# Patient Record
Sex: Female | Born: 1935 | Race: White | Hispanic: No | Smoking: Former smoker
Health system: Southern US, Community
[De-identification: ages and names within clinical notes are randomized; demographics above are authoritative.]

## PROBLEM LIST (undated history)

## (undated) DIAGNOSIS — I495 Sick sinus syndrome: Secondary | ICD-10-CM

## (undated) DIAGNOSIS — M1990 Unspecified osteoarthritis, unspecified site: Secondary | ICD-10-CM

## (undated) DIAGNOSIS — R0609 Other forms of dyspnea: Secondary | ICD-10-CM

## (undated) DIAGNOSIS — I442 Atrioventricular block, complete: Secondary | ICD-10-CM

## (undated) DIAGNOSIS — E785 Hyperlipidemia, unspecified: Secondary | ICD-10-CM

## (undated) DIAGNOSIS — B49 Unspecified mycosis: Secondary | ICD-10-CM

## (undated) DIAGNOSIS — N183 Chronic kidney disease, stage 3 unspecified: Secondary | ICD-10-CM

## (undated) DIAGNOSIS — H7090 Unspecified mastoiditis, unspecified ear: Secondary | ICD-10-CM

## (undated) DIAGNOSIS — G4700 Insomnia, unspecified: Secondary | ICD-10-CM

## (undated) DIAGNOSIS — E039 Hypothyroidism, unspecified: Secondary | ICD-10-CM

## (undated) DIAGNOSIS — M810 Age-related osteoporosis without current pathological fracture: Secondary | ICD-10-CM

## (undated) DIAGNOSIS — I10 Essential (primary) hypertension: Secondary | ICD-10-CM

## (undated) DIAGNOSIS — R0600 Dyspnea, unspecified: Secondary | ICD-10-CM

## (undated) HISTORY — DX: Sick sinus syndrome: I49.5

## (undated) HISTORY — DX: Insomnia, unspecified: G47.00

## (undated) HISTORY — PX: THYROIDECTOMY: SHX17

## (undated) HISTORY — PX: MASTOIDECTOMY: SHX711

## (undated) HISTORY — DX: Hypothyroidism, unspecified: E03.9

## (undated) HISTORY — PX: CATARACT EXTRACTION: SUR2

## (undated) HISTORY — DX: Hyperlipidemia, unspecified: E78.5

## (undated) HISTORY — PX: LAPAROTOMY: SHX154

## (undated) HISTORY — DX: Essential (primary) hypertension: I10

## (undated) HISTORY — DX: Unspecified mastoiditis, unspecified ear: H70.90

## (undated) HISTORY — DX: Unspecified osteoarthritis, unspecified site: M19.90

## (undated) HISTORY — PX: BACK SURGERY: SHX140

## (undated) HISTORY — DX: Chronic kidney disease, stage 3 unspecified: N18.30

## (undated) HISTORY — DX: Chronic kidney disease, stage 3 (moderate): N18.3

## (undated) HISTORY — DX: Other forms of dyspnea: R06.09

## (undated) HISTORY — DX: Age-related osteoporosis without current pathological fracture: M81.0

## (undated) HISTORY — DX: Unspecified mycosis: B49

## (undated) HISTORY — DX: Dyspnea, unspecified: R06.00

## (undated) HISTORY — DX: Atrioventricular block, complete: I44.2

---

## 2009-11-26 HISTORY — PX: INSERT / REPLACE / REMOVE PACEMAKER: SUR710

## 2011-12-29 ENCOUNTER — Encounter (INDEPENDENT_AMBULATORY_CARE_PROVIDER_SITE_OTHER): Payer: Medicare Other | Admitting: Ophthalmology

## 2011-12-29 DIAGNOSIS — H3530 Unspecified macular degeneration: Secondary | ICD-10-CM

## 2011-12-29 DIAGNOSIS — H43819 Vitreous degeneration, unspecified eye: Secondary | ICD-10-CM

## 2012-12-29 ENCOUNTER — Ambulatory Visit (INDEPENDENT_AMBULATORY_CARE_PROVIDER_SITE_OTHER): Payer: Medicare Other | Admitting: Ophthalmology

## 2012-12-29 DIAGNOSIS — H35039 Hypertensive retinopathy, unspecified eye: Secondary | ICD-10-CM

## 2012-12-29 DIAGNOSIS — H3530 Unspecified macular degeneration: Secondary | ICD-10-CM

## 2012-12-29 DIAGNOSIS — I10 Essential (primary) hypertension: Secondary | ICD-10-CM

## 2012-12-29 DIAGNOSIS — H43819 Vitreous degeneration, unspecified eye: Secondary | ICD-10-CM

## 2013-03-18 ENCOUNTER — Encounter: Payer: Self-pay | Admitting: Internal Medicine

## 2013-03-30 ENCOUNTER — Encounter: Payer: Medicare Other | Admitting: Internal Medicine

## 2013-05-05 ENCOUNTER — Ambulatory Visit (INDEPENDENT_AMBULATORY_CARE_PROVIDER_SITE_OTHER): Payer: Medicare Other | Admitting: Internal Medicine

## 2013-05-05 ENCOUNTER — Encounter: Payer: Self-pay | Admitting: Internal Medicine

## 2013-05-05 VITALS — BP 148/78 | HR 65 | Ht 64.0 in | Wt 139.2 lb

## 2013-05-05 DIAGNOSIS — I442 Atrioventricular block, complete: Secondary | ICD-10-CM | POA: Insufficient documentation

## 2013-05-05 DIAGNOSIS — I10 Essential (primary) hypertension: Secondary | ICD-10-CM | POA: Insufficient documentation

## 2013-05-05 LAB — MDC_IDC_ENUM_SESS_TYPE_INCLINIC
Brady Statistic RA Percent Paced: 60 %
Brady Statistic RV Percent Paced: 99.77 %
Date Time Interrogation Session: 20150122130043
Implantable Pulse Generator Model: 2210
Implantable Pulse Generator Serial Number: 7154389
Lead Channel Impedance Value: 425 Ohm
Lead Channel Impedance Value: 675 Ohm
Lead Channel Pacing Threshold Pulse Width: 0.4 ms
Lead Channel Pacing Threshold Pulse Width: 0.4 ms
Lead Channel Sensing Intrinsic Amplitude: 1.7 mV
Lead Channel Sensing Intrinsic Amplitude: 11.9 mV
Lead Channel Setting Sensing Sensitivity: 4 mV
MDC IDC MSMT BATTERY REMAINING LONGEVITY: 86.4 mo
MDC IDC MSMT BATTERY VOLTAGE: 2.95 V
MDC IDC MSMT LEADCHNL RA PACING THRESHOLD AMPLITUDE: 0.75 V
MDC IDC MSMT LEADCHNL RV PACING THRESHOLD AMPLITUDE: 0.75 V
MDC IDC SET LEADCHNL RA PACING AMPLITUDE: 2 V
MDC IDC SET LEADCHNL RV PACING AMPLITUDE: 2.5 V
MDC IDC SET LEADCHNL RV PACING PULSEWIDTH: 0.4 ms

## 2013-05-05 NOTE — Progress Notes (Addendum)
Primary Cardiologist:  Dr Ferne ReusSmith  Bethany Landry is a 80 y.o. female with a h/o complete heart block sp PPM (SJM) by Dr [facility] who presents today to establish care in the Electrophysiology device clinic.   The patient reports doing very well since having a pacemaker implanted and remains very active despite her age.  She is mostly limited by arthritis.  She walks with a cane.   Today, she  denies symptoms of palpitations, chest pain, shortness of breath, orthopnea, PND, lower extremity edema, dizziness, presyncope, syncope, or neurologic sequela.  The patientis tolerating medications without difficulties and is otherwise without complaint today.   Past Medical History  Diagnosis Date  . Osteoporosis   . Benign hypertension   . Hyperlipidemia   . Osteoarthritis   . Insomnia   . Hypothyroidism   . Complete heart block   . Mastoiditis     hearing loss, s/p mastoiectomy, right ear  . DOE (dyspnea on exertion)   . CKD (chronic kidney disease), stage III     moderate  . Sinoatrial node dysfunction   . Fungal infection     recurrent   Past Surgical History  Procedure Laterality Date  . Mastoidectomy Right     Tumor removal and residual recurrent fungal infection. Dr. Pollyann Kennedyosen  . Thyroidectomy    . Back surgery    . Laparotomy      Ex lap for abdominal pain (ischemic colitis)  . Insert / replace / remove pacemaker  11/26/09    St. Jude, DDD PM  . Cataract extraction      History   Social History  . Marital Status: Divorced    Spouse Name: N/A    Number of Children: N/A  . Years of Education: N/A   Occupational History  . Not on file.   Social History Main Topics  . Smoking status: Former Smoker    Types: Cigarettes    Quit date: 04/14/1982  . Smokeless tobacco: Not on file  . Alcohol Use: Yes     Comment: wine, occasionally  . Drug Use: No  . Sexual Activity: Not on file   Other Topics Concern  . Not on file   Social History Narrative  . No narrative  on file    Family History  Problem Relation Age of Onset  . CAD Brother   . Cerebral aneurysm Brother     Allergies  Allergen Reactions  . Ace Inhibitors Swelling and Cough  . Norvasc [Amlodipine Besylate] Swelling    Swelling in ankles  . Quinapril Hcl     Current Outpatient Prescriptions  Medication Sig Dispense Refill  . aspirin 81 MG tablet Take 81 mg by mouth daily.      . Biotin 2500 MCG CAPS Take 1 capsule by mouth daily.      . hydrochlorothiazide (HYDRODIURIL) 25 MG tablet Take 25 mg by mouth daily.      Marland Kitchen. levothyroxine (LEVOXYL) 88 MCG tablet Take 88 mcg by mouth daily before breakfast.      . metoprolol tartrate (LOPRESSOR) 25 MG tablet Take 25 mg by mouth 2 (two) times daily.      . Multiple Vitamins-Minerals (PRESERVISION AREDS PO) Take 1 capsule by mouth 2 (two) times daily.      . simvastatin (ZOCOR) 10 MG tablet Take 10 mg by mouth daily.       No current facility-administered medications for this visit.    ROS- all systems are reviewed and negative  except as per HPI  Physical Exam: Filed Vitals:   05/05/13 1245  BP: 148/78  Pulse: 65  Height: 5\' 4"  (1.626 m)  Weight: 139 lb 4 oz (63.163 kg)    GEN- The patient is elderly appearing, alert and oriented x 3 today.   Head- normocephalic, atraumatic Eyes-  Sclera clear, conjunctiva pink Ears- hearing intact Oropharynx- clear Neck- supple,  Lungs- Clear to ausculation bilaterally, normal work of breathing Chest- pacemaker pocket is well healed Heart- Regular rate and rhythm  GI- soft, NT, ND, + BS Extremities- no clubbing, cyanosis, or edema MS- diffuse muscular atrophy, walks slowly with a cane Skin- no rash or lesion Psych- euthymic mood, full affect Neuro- strength and sensation are intact  Pacemaker interrogation- reviewed in detail today,  See PACEART report  Assessment and Plan:  1. Complete heart block Normal pacemaker function though there has been some noise on the atrial lead--> we  will follow this conservatively given her advanced age See Arita Miss Art report No changes today  2. HTN Stable No change required today  Merlin Return to see Nehemiah Settle in the device clinic in 1 year Follow-up with Dr Katrinka Blazing as scheduled

## 2013-05-05 NOTE — Patient Instructions (Addendum)
Your physician wants you to follow-up in: 12 months with Rick DuffBrooke Edmisten, PA You will receive a reminder letter in the mail two months in advance. If you don't receive a letter, please call our office to schedule the follow-up appointment.   Remote monitoring is used to monitor your Pacemaker of ICD from home. This monitoring reduces the number of office visits required to check your device to one time per year. It allows us to keep an eye on the functioning of your device to ensure it is working properly. You are scheduled for a device check from home on 08/08/13. You may send your transmission at any time that day. If you have a wireless device, the transmission will be sent automatically. After your physician reviews your transmission, you will receive a postcard with your next transmission date.

## 2013-05-09 ENCOUNTER — Ambulatory Visit
Admission: RE | Admit: 2013-05-09 | Discharge: 2013-05-09 | Disposition: A | Discharge status: Home / Self Care | Payer: Medicare Other | Source: Ambulatory Visit | Attending: Internal Medicine | Admitting: Internal Medicine

## 2013-05-09 ENCOUNTER — Other Ambulatory Visit: Payer: Self-pay | Admitting: Internal Medicine

## 2013-05-09 DIAGNOSIS — R0609 Other forms of dyspnea: Secondary | ICD-10-CM

## 2013-05-09 DIAGNOSIS — R0989 Other specified symptoms and signs involving the circulatory and respiratory systems: Principal | ICD-10-CM

## 2013-05-23 ENCOUNTER — Other Ambulatory Visit: Payer: Self-pay

## 2013-05-23 MED ORDER — METOPROLOL TARTRATE 25 MG PO TABS: 25.0000 mg | ORAL_TABLET | Freq: Two times a day (BID) | ORAL | Status: DC

## 2013-06-08 ENCOUNTER — Ambulatory Visit: Payer: Medicare Other | Admitting: Interventional Cardiology

## 2013-06-14 ENCOUNTER — Ambulatory Visit (INDEPENDENT_AMBULATORY_CARE_PROVIDER_SITE_OTHER): Payer: Medicare Other | Admitting: Interventional Cardiology

## 2013-06-14 ENCOUNTER — Encounter: Payer: Self-pay | Admitting: Interventional Cardiology

## 2013-06-14 VITALS — BP 164/84 | HR 65 | Ht 64.0 in | Wt 137.0 lb

## 2013-06-14 DIAGNOSIS — J8410 Pulmonary fibrosis, unspecified: Secondary | ICD-10-CM

## 2013-06-14 DIAGNOSIS — I10 Essential (primary) hypertension: Secondary | ICD-10-CM

## 2013-06-14 DIAGNOSIS — J849 Interstitial pulmonary disease, unspecified: Secondary | ICD-10-CM

## 2013-06-14 DIAGNOSIS — I442 Atrioventricular block, complete: Secondary | ICD-10-CM

## 2013-06-14 DIAGNOSIS — Z950 Presence of cardiac pacemaker: Secondary | ICD-10-CM

## 2013-06-14 NOTE — Patient Instructions (Signed)
Your physician recommends that you continue on your current medications as directed. Please refer to the Current Medication list given to you today.  Your physician wants you to follow-up in: 1 year. You will receive a reminder letter in the mail two months in advance. If you don't receive a letter, please call our office to schedule the follow-up appointment.  

## 2013-06-14 NOTE — Progress Notes (Signed)
Patient ID: Bethany Landry, female   DOB: 1921/02/10, 80 y.o.   MRN: [id number]    [address] [place], [place]  [address] Phone: [phone] Fax:  [phone]  Date:  06/14/2013   ID:  Bethany Landry, DOB 11-10-1920, MRN [id number]  PCP:  Orland Penman, MD   ASSESSMENT:  1. Complete heart block treated with DDD pacemaker, asymptomatic 2. Dyspnea on exertion and interstitial lung disease noted on chest x-ray. History of prior moderate cigarette smoking. 3. Hypertension, borderline control  PLAN:  1. Systolic hypertension with borderline control. At her age I would not want to have the blood pressure too low. 2. No specific testing seems indicated at this time   SUBJECTIVE: Bethany Landry is a 80 y.o. female who has as her only complaint, moderate exertional dyspnea. This is unchanged compared with 6 and 12 months ago. There is no cough, orthopnea, lower extremity edema, or chest pain. She has not had syncope. She has not had tachycardia.   Wt Readings from Last 3 Encounters:  06/14/13 137 lb (62.143 kg)  05/05/13 139 lb 4 oz (63.163 kg)     Past Medical History  Diagnosis Date  . Osteoporosis   . Benign hypertension   . Hyperlipidemia   . Osteoarthritis   . Insomnia   . Hypothyroidism   . Complete heart block   . Mastoiditis     hearing loss, s/p mastoiectomy, right ear  . DOE (dyspnea on exertion)   . CKD (chronic kidney disease), stage III     moderate  . Sinoatrial node dysfunction   . Fungal infection     recurrent    Current Outpatient Prescriptions  Medication Sig Dispense Refill  . aspirin 81 MG tablet Take 81 mg by mouth daily.      . Biotin 2500 MCG CAPS Take 1 capsule by mouth daily.      . hydrochlorothiazide (HYDRODIURIL) 25 MG tablet Take 25 mg by mouth daily.      Marland Kitchen levothyroxine (SYNTHROID, LEVOTHROID) 150 MCG tablet Take 150 mcg by mouth daily before breakfast.      . metoprolol tartrate (LOPRESSOR) 25 MG tablet Take 1  tablet (25 mg total) by mouth 2 (two) times daily.  60 tablet  3  . Multiple Vitamins-Minerals (PRESERVISION AREDS PO) Take 1 capsule by mouth 2 (two) times daily.      . simvastatin (ZOCOR) 10 MG tablet Take 10 mg by mouth daily.      Marland Kitchen tiotropium (SPIRIVA HANDIHALER) 18 MCG inhalation capsule Place 18 mcg into inhaler and inhale daily.       No current facility-administered medications for this visit.    Allergies:    Allergies  Allergen Reactions  . Ace Inhibitors Swelling and Cough  . Norvasc [Amlodipine Besylate] Swelling    Swelling in ankles  . Quinapril Hcl     Social History:  The patient  reports that she quit smoking about 31 years ago. Her smoking use included Cigarettes. She smoked 0.00 packs per day. She does not have any smokeless tobacco history on file. She reports that she drinks alcohol. She reports that she does not use illicit drugs.   ROS:  Please see the history of present illness.    All other systems reviewed and negative.   OBJECTIVE: VS:  BP 164/84  Pulse 65  Ht 5\' 4"  (1.626 m)  Wt 137 lb (62.143 kg)  BMI 23.50 kg/m2 Well nourished, well developed, in no acute distress,  appears stated age, slender HEENT: normal Neck: JVD flat. Carotid bruit absent  Cardiac:  normal S1, S2; RRR; no murmur Lungs:  clear to auscultation bilaterally, no wheezing, rhonchi or rales Abd: soft, nontender, no hepatomegaly Ext: Edema absent. Pulses 2+ and symmetric Skin: warm and dry Neuro:  CNs 2-12 intact, no focal abnormalities noted  EKG:  AV sequential pacing and atrial tracking.       Signed, Darci NeedleHenry W. B. Paulino III, MD 06/14/2013 12:00 PM  Past Medical History  Osteoporosis   Hypertension, benign   Hyperlipidemia   Osteoarthritis   Insomnia   Hypothyroidism- sp thyroidectomy   DDD pacemaker   Dr [name]   Has had pneumovax   mastoiditis, hearing loss, s/p mastoiectomy right ear, ENT Dr. Pollyann Kennedyosen

## 2013-08-08 ENCOUNTER — Encounter: Payer: Medicare Other | Admitting: *Deleted

## 2013-08-24 ENCOUNTER — Encounter: Payer: Self-pay | Admitting: *Deleted

## 2013-08-30 ENCOUNTER — Encounter: Payer: Self-pay | Admitting: Internal Medicine

## 2013-08-30 ENCOUNTER — Telehealth: Payer: Self-pay | Admitting: *Deleted

## 2013-08-30 ENCOUNTER — Ambulatory Visit (INDEPENDENT_AMBULATORY_CARE_PROVIDER_SITE_OTHER): Payer: Medicare Other | Admitting: *Deleted

## 2013-08-30 DIAGNOSIS — I442 Atrioventricular block, complete: Secondary | ICD-10-CM

## 2013-08-30 DIAGNOSIS — Z950 Presence of cardiac pacemaker: Secondary | ICD-10-CM

## 2013-08-30 NOTE — Telephone Encounter (Signed)
Pt left a VM on my phone. I attempted to return her call regarding her missed transmission on 08/08/13. I identified myself, [facility], and specifically stated I'm from Dr. Jenel LucksAllred's office regarding her pacemaker. Pt said, "No, I don't know," then hung up.   I called pt back, clarified who I am.  Pt understood instructions how to send a manual transmission; attempted to send upon hanging phone up. Transmission not received. I called her back again, gave her tech svcs #.

## 2013-09-01 ENCOUNTER — Other Ambulatory Visit: Payer: Self-pay | Admitting: Cardiology

## 2013-09-01 LAB — MDC_IDC_ENUM_SESS_TYPE_REMOTE
Battery Remaining Longevity: 76 mo
Battery Remaining Percentage: 64 %
Brady Statistic AP VS Percent: 1 %
Brady Statistic AS VP Percent: 46 %
Brady Statistic AS VS Percent: 1 %
Brady Statistic RA Percent Paced: 53 %
Implantable Pulse Generator Model: 2210
Lead Channel Pacing Threshold Amplitude: 0.75 V
Lead Channel Pacing Threshold Amplitude: 0.75 V
Lead Channel Pacing Threshold Pulse Width: 0.4 ms
Lead Channel Sensing Intrinsic Amplitude: 12 mV
Lead Channel Setting Pacing Amplitude: 2 V
Lead Channel Setting Pacing Amplitude: 2.5 V
Lead Channel Setting Pacing Pulse Width: 0.4 ms
MDC IDC MSMT BATTERY VOLTAGE: 2.93 V
MDC IDC MSMT LEADCHNL RA IMPEDANCE VALUE: 440 Ohm
MDC IDC MSMT LEADCHNL RA PACING THRESHOLD PULSEWIDTH: 0.4 ms
MDC IDC MSMT LEADCHNL RA SENSING INTR AMPL: 5 mV
MDC IDC MSMT LEADCHNL RV IMPEDANCE VALUE: 680 Ohm
MDC IDC PG SERIAL: 7154389
MDC IDC SESS DTM: 20150519142020
MDC IDC SET LEADCHNL RV SENSING SENSITIVITY: 4 mV
MDC IDC STAT BRADY AP VP PERCENT: 53 %
MDC IDC STAT BRADY RV PERCENT PACED: 99 %

## 2013-09-01 NOTE — Progress Notes (Signed)
Remote pacemaker transmission.   

## 2013-09-09 ENCOUNTER — Encounter: Payer: Self-pay | Admitting: Cardiology

## 2013-09-19 ENCOUNTER — Other Ambulatory Visit: Payer: Self-pay | Admitting: Internal Medicine

## 2013-10-12 ENCOUNTER — Emergency Department (HOSPITAL_COMMUNITY): Payer: Medicare Other

## 2013-10-12 ENCOUNTER — Emergency Department (HOSPITAL_COMMUNITY)
Admission: EM | Admit: 2013-10-12 | Discharge: 2013-10-12 | Disposition: A | Discharge status: Home / Self Care | Payer: Medicare Other | Attending: Emergency Medicine | Admitting: Emergency Medicine

## 2013-10-12 DIAGNOSIS — M25559 Pain in unspecified hip: Secondary | ICD-10-CM | POA: Insufficient documentation

## 2013-10-12 DIAGNOSIS — E785 Hyperlipidemia, unspecified: Secondary | ICD-10-CM | POA: Insufficient documentation

## 2013-10-12 DIAGNOSIS — Z87891 Personal history of nicotine dependence: Secondary | ICD-10-CM | POA: Insufficient documentation

## 2013-10-12 DIAGNOSIS — Y939 Activity, unspecified: Secondary | ICD-10-CM | POA: Insufficient documentation

## 2013-10-12 DIAGNOSIS — R0609 Other forms of dyspnea: Secondary | ICD-10-CM | POA: Insufficient documentation

## 2013-10-12 DIAGNOSIS — M5489 Other dorsalgia: Secondary | ICD-10-CM

## 2013-10-12 DIAGNOSIS — N183 Chronic kidney disease, stage 3 unspecified: Secondary | ICD-10-CM | POA: Insufficient documentation

## 2013-10-12 DIAGNOSIS — M1990 Unspecified osteoarthritis, unspecified site: Secondary | ICD-10-CM | POA: Insufficient documentation

## 2013-10-12 DIAGNOSIS — G4700 Insomnia, unspecified: Secondary | ICD-10-CM | POA: Insufficient documentation

## 2013-10-12 DIAGNOSIS — Z79899 Other long term (current) drug therapy: Secondary | ICD-10-CM | POA: Insufficient documentation

## 2013-10-12 DIAGNOSIS — B49 Unspecified mycosis: Secondary | ICD-10-CM | POA: Insufficient documentation

## 2013-10-12 DIAGNOSIS — M25551 Pain in right hip: Secondary | ICD-10-CM

## 2013-10-12 DIAGNOSIS — M549 Dorsalgia, unspecified: Secondary | ICD-10-CM | POA: Insufficient documentation

## 2013-10-12 DIAGNOSIS — H7090 Unspecified mastoiditis, unspecified ear: Secondary | ICD-10-CM | POA: Insufficient documentation

## 2013-10-12 DIAGNOSIS — R0989 Other specified symptoms and signs involving the circulatory and respiratory systems: Secondary | ICD-10-CM | POA: Insufficient documentation

## 2013-10-12 DIAGNOSIS — I495 Sick sinus syndrome: Secondary | ICD-10-CM | POA: Insufficient documentation

## 2013-10-12 DIAGNOSIS — Z7982 Long term (current) use of aspirin: Secondary | ICD-10-CM | POA: Insufficient documentation

## 2013-10-12 DIAGNOSIS — M810 Age-related osteoporosis without current pathological fracture: Secondary | ICD-10-CM | POA: Insufficient documentation

## 2013-10-12 DIAGNOSIS — E039 Hypothyroidism, unspecified: Secondary | ICD-10-CM | POA: Insufficient documentation

## 2013-10-12 DIAGNOSIS — I442 Atrioventricular block, complete: Secondary | ICD-10-CM | POA: Insufficient documentation

## 2013-10-12 DIAGNOSIS — Z888 Allergy status to other drugs, medicaments and biological substances status: Secondary | ICD-10-CM | POA: Insufficient documentation

## 2013-10-12 DIAGNOSIS — Y9210 Unspecified residential institution as the place of occurrence of the external cause: Secondary | ICD-10-CM | POA: Insufficient documentation

## 2013-10-12 DIAGNOSIS — R296 Repeated falls: Secondary | ICD-10-CM | POA: Insufficient documentation

## 2013-10-12 LAB — URINALYSIS, ROUTINE W REFLEX MICROSCOPIC
Bilirubin Urine: NEGATIVE
GLUCOSE, UA: NEGATIVE mg/dL
HGB URINE DIPSTICK: NEGATIVE
Ketones, ur: NEGATIVE mg/dL
Nitrite: NEGATIVE
Protein, ur: NEGATIVE mg/dL
Specific Gravity, Urine: 1.01 (ref 1.005–1.030)
Urobilinogen, UA: 0.2 mg/dL (ref 0.0–1.0)
pH: 7 (ref 5.0–8.0)

## 2013-10-12 LAB — BASIC METABOLIC PANEL
Anion gap: 13 (ref 5–15)
BUN: 34 mg/dL — AB (ref 6–23)
CO2: 25 mEq/L (ref 19–32)
CREATININE: 1.23 mg/dL — AB (ref 0.50–1.10)
Calcium: 9 mg/dL (ref 8.4–10.5)
Chloride: 92 mEq/L — ABNORMAL LOW (ref 96–112)
GFR calc Af Amer: 43 mL/min — ABNORMAL LOW (ref >90)
GFR, EST NON AFRICAN AMERICAN: 37 mL/min — AB (ref >90)
GLUCOSE: 141 mg/dL — AB (ref 70–99)
POTASSIUM: 3.8 meq/L (ref 3.7–5.3)
Sodium: 130 mEq/L — ABNORMAL LOW (ref 137–147)

## 2013-10-12 LAB — CBC
HEMATOCRIT: 28.7 % — AB (ref 36.0–46.0)
Hemoglobin: 10 g/dL — ABNORMAL LOW (ref 12.0–15.0)
MCH: 29.7 pg (ref 26.0–34.0)
MCHC: 34.8 g/dL (ref 30.0–36.0)
MCV: 85.2 fL (ref 78.0–100.0)
Platelets: 211 10*3/uL (ref 150–400)
RBC: 3.37 MIL/uL — ABNORMAL LOW (ref 3.87–5.11)
RDW: 12.9 % (ref 11.5–15.5)
WBC: 8 10*3/uL (ref 4.0–10.5)

## 2013-10-12 LAB — URINE MICROSCOPIC-ADD ON

## 2013-10-12 MED ORDER — TRAMADOL HCL 50 MG PO TABS: 50.0000 mg | ORAL_TABLET | Freq: Four times a day (QID) | ORAL | Status: DC | PRN

## 2013-10-12 MED ORDER — HYDROCODONE-ACETAMINOPHEN 5-325 MG PO TABS: 2.0000 | ORAL_TABLET | ORAL | Status: DC | PRN

## 2013-10-12 MED ORDER — MORPHINE SULFATE 2 MG/ML IJ SOLN
2.0000 mg | Freq: Once | INTRAMUSCULAR | Status: AC
  Administered 2013-10-12: 2 mg via INTRAVENOUS
  Filled 2013-10-12: qty 1

## 2013-10-12 NOTE — ED Notes (Signed)
Per EMS: pt coming from assisted living [facility] with c/o fall and right sided hip pain. Pt reports falling on Monday. Pt denies LOC, pt stated she tripped on an object in her room. Pt denies neck and back pain. Bruising noted to right hip. Pt A&Ox4, respirations equal and unlabored, skin warm and dry

## 2013-10-12 NOTE — ED Provider Notes (Signed)
CSN: [id number]     Arrival date & time 10/12/13  1927 History   First MD Initiated Contact with Patient 10/12/13 1932     Chief Complaint  Patient presents with  . Fall     (Consider location/radiation/quality/duration/timing/severity/associated sxs/prior treatment)  Patient is a 80 y.o. female presenting with fall.  Fall This is a new problem. The current episode started in the past 7 days. The problem has been unchanged. Pertinent negatives include no abdominal pain, chest pain, chills, congestion, coughing, diaphoresis, fever, headaches, nausea, neck pain, numbness, rash, sore throat or vomiting. Nothing aggravates the symptoms. She has tried nothing for the symptoms.   Past Medical History  Diagnosis Date  . Osteoporosis   . Benign hypertension   . Hyperlipidemia   . Osteoarthritis   . Insomnia   . Hypothyroidism   . Complete heart block   . Mastoiditis     hearing loss, s/p mastoiectomy, right ear  . DOE (dyspnea on exertion)   . CKD (chronic kidney disease), stage III     moderate  . Sinoatrial node dysfunction   . Fungal infection     recurrent   Past Surgical History  Procedure Laterality Date  . Mastoidectomy Right     Tumor removal and residual recurrent fungal infection. Dr. Pollyann Kennedyosen  . Thyroidectomy    . Back surgery    . Laparotomy      Ex lap for abdominal pain (ischemic colitis)  . Insert / replace / remove pacemaker  11/26/09    St. Jude, DDD PM  . Cataract extraction     Family History  Problem Relation Age of Onset  . CAD Brother   . Cerebral aneurysm Brother   . Heart attack Mother    History  Substance Use Topics  . Smoking status: Former Smoker    Types: Cigarettes    Quit date: 04/14/1982  . Smokeless tobacco: Not on file  . Alcohol Use: Yes     Comment: wine, occasionally   OB History   Grav Para Term Preterm Abortions TAB SAB Ect Mult Living                 Review of Systems  Constitutional: Negative for fever, chills and  diaphoresis.  HENT: Negative for congestion and sore throat.   Eyes: Negative for pain.  Respiratory: Negative for cough and shortness of breath.   Cardiovascular: Negative for chest pain.  Gastrointestinal: Negative for nausea, vomiting, abdominal pain and diarrhea.  Genitourinary: Negative for dysuria.  Musculoskeletal: Positive for back pain. Negative for neck pain.       Hip pain  Skin: Negative for rash.  Neurological: Negative for numbness and headaches.      Allergies  Ace inhibitors; Norvasc; and Quinapril hcl  Home Medications   Prior to Admission medications   Medication Sig Start Date End Date Taking? Authorizing Provider  aspirin EC 81 MG tablet Take 81 mg by mouth daily.   Yes Historical Provider, MD  Biotin 2500 MCG CAPS Take 2,500 mcg by mouth daily.    Yes Historical Provider, MD  hydrochlorothiazide (HYDRODIURIL) 25 MG tablet Take 25 mg by mouth daily.   Yes Historical Provider, MD  levothyroxine (SYNTHROID, LEVOTHROID) 150 MCG tablet Take 150 mcg by mouth daily before breakfast.   Yes Historical Provider, MD  metoprolol tartrate (LOPRESSOR) 25 MG tablet Take 25 mg by mouth 2 (two) times daily.   Yes Historical Provider, MD  Multiple Vitamins-Minerals (PRESERVISION AREDS PO) Take 1 capsule  by mouth 2 (two) times daily.   Yes Historical Provider, MD  simvastatin (ZOCOR) 10 MG tablet Take 10 mg by mouth at bedtime.    Yes Historical Provider, MD  traMADol (ULTRAM) 50 MG tablet Take 1 tablet (50 mg total) by mouth every 6 (six) hours as needed for moderate pain or severe pain. 10/12/13   Imagene ShellerSteve Skarlett Sedlacek, MD   BP 179/94  Pulse 65  Temp(Src) 98 F (36.7 C) (Oral)  Resp 20  SpO2 100% Physical Exam  Constitutional: She is oriented to person, place, and time. She appears well-developed and well-nourished. No distress.  HENT:  Head: Normocephalic and atraumatic.  Eyes: Pupils are equal, round, and reactive to light. Right eye exhibits no discharge. Left eye exhibits no  discharge.  Neck: Normal range of motion.  Cardiovascular: Normal rate, regular rhythm and normal heart sounds.   Pulmonary/Chest: Effort normal and breath sounds normal.  Abdominal: Soft. She exhibits no distension. There is no tenderness.  Musculoskeletal: Normal range of motion.  Neurological: She is alert and oriented to person, place, and time.  Skin: Skin is warm. She is not diaphoretic.   ED Course  Procedures (including critical care time) Labs Review Labs Reviewed  CBC - Abnormal; Notable for the following:    RBC 3.37 (*)    Hemoglobin 10.0 (*)    HCT 28.7 (*)    All other components within normal limits  BASIC METABOLIC PANEL - Abnormal; Notable for the following:    Sodium 130 (*)    Chloride 92 (*)    Glucose, Bld 141 (*)    BUN 34 (*)    Creatinine, Ser 1.23 (*)    GFR calc non Af Amer 37 (*)    GFR calc Af Amer 43 (*)    All other components within normal limits  URINALYSIS, ROUTINE W REFLEX MICROSCOPIC - Abnormal; Notable for the following:    Leukocytes, UA SMALL (*)    All other components within normal limits  URINE MICROSCOPIC-ADD ON    Imaging Review Dg Cervical Spine 2-3 Views  10/12/2013   CLINICAL DATA:  Fall 2 days ago  EXAM: CERVICAL SPINE - 2-3 VIEW  COMPARISON:  None.  FINDINGS: Anterolisthesis of C4 on C5 is noted. There is multi level disc space narrowing and ventral endplate spurring. This is most advanced at C5-6. Facet hypertrophy and degenerative change is identified. The prevertebral soft tissue space appears normal. There is no evidence for fracture.  IMPRESSION: 1. Cervical spondylosis.  No fracture or dislocation. 2. Anterolisthesis of C4 on C5 is likely related to spondylosis.   Electronically Signed   By: Signa Kellaylor  Stroud M.D.   On: 10/12/2013 23:18   Dg Lumbar Spine 2-3 Views  10/12/2013   CLINICAL DATA:  Status post fall.  EXAM: LUMBAR SPINE - 2-3 VIEW  COMPARISON:  None.  FINDINGS: The alignment of the lumbar spine is normal. There has been  previous posterior fixation of the thoracic and lumbar spine from T10 through S1. The vertebral body heights are well preserved. No acute fractures identified.  IMPRESSION: 1. No acute findings. 2. Prior hardware fixation of the thoracic and lumbar spine.   Electronically Signed   By: Signa Kellaylor  Stroud M.D.   On: 10/12/2013 22:07   Dg Hip Complete Right  10/12/2013   CLINICAL DATA:  Fall  EXAM: RIGHT HIP - COMPLETE 2+ VIEW  COMPARISON:  None.  FINDINGS: Both hips appear located. There is no acute fracture or subluxation. Hardware fixation of  the lumbar spine and sacrum noted.  IMPRESSION: 1. No acute findings.   Electronically Signed   By: Signa Kell M.D.   On: 10/12/2013 22:10    EKG Interpretation None      MDM   Final diagnoses:  Midline back pain, unspecified location  Right hip pain   80 yo F presents from ALF after Fall 2 days ago with worsening back / hip pain.   Patient with tenderness in R side, back. No tenderness in hip. Given fall and age, will obtain XRs of lumbar spine, R hip. Patient states mechanical fall - no syncope, chest pain, dizziness, lightheadedness. Denies head trauma. Had some C-spine tenderness earlier, but none on exam. Will obtain XR of sites of tenderness.   No evidence of abnormalities on XRs. Patient to follow-up with PCP as needed. Discharged in stable condition. Ultram RX given. Strict return precautions given. Patient seen and evaluated by myself and my attending, Dr. Rubin Payor.    Imagene Sheller, MD 10/13/13 Marlyne Beards

## 2013-10-13 NOTE — ED Provider Notes (Signed)
I saw and evaluated the patient, reviewed the resident's note and I agree with the findings and plan.   EKG Interpretation None     Patient with fall imaging reassuring. Nonfocal exam. Will discharge home  Juliet Rudeathan R. Rubin PayorPickering, MD 10/13/13 1447

## 2013-10-31 ENCOUNTER — Ambulatory Visit
Admission: RE | Admit: 2013-10-31 | Discharge: 2013-10-31 | Disposition: A | Discharge status: Home / Self Care | Payer: Medicare Other | Source: Ambulatory Visit | Attending: Internal Medicine | Admitting: Internal Medicine

## 2013-10-31 ENCOUNTER — Other Ambulatory Visit: Payer: Self-pay | Admitting: Internal Medicine

## 2013-10-31 DIAGNOSIS — R1310 Dysphagia, unspecified: Secondary | ICD-10-CM

## 2013-11-08 ENCOUNTER — Other Ambulatory Visit (HOSPITAL_COMMUNITY): Payer: Self-pay | Admitting: Internal Medicine

## 2013-11-08 DIAGNOSIS — R1310 Dysphagia, unspecified: Secondary | ICD-10-CM

## 2013-11-10 ENCOUNTER — Ambulatory Visit (HOSPITAL_COMMUNITY)
Admission: RE | Admit: 2013-11-10 | Discharge: 2013-11-10 | Disposition: A | Discharge status: Home / Self Care | Payer: Medicare Other | Source: Ambulatory Visit | Attending: Internal Medicine | Admitting: Internal Medicine

## 2013-11-10 DIAGNOSIS — I129 Hypertensive chronic kidney disease with stage 1 through stage 4 chronic kidney disease, or unspecified chronic kidney disease: Secondary | ICD-10-CM | POA: Insufficient documentation

## 2013-11-10 DIAGNOSIS — R1313 Dysphagia, pharyngeal phase: Secondary | ICD-10-CM | POA: Insufficient documentation

## 2013-11-10 DIAGNOSIS — N183 Chronic kidney disease, stage 3 unspecified: Secondary | ICD-10-CM | POA: Diagnosis not present

## 2013-11-10 DIAGNOSIS — E039 Hypothyroidism, unspecified: Secondary | ICD-10-CM | POA: Diagnosis not present

## 2013-11-10 DIAGNOSIS — R1310 Dysphagia, unspecified: Secondary | ICD-10-CM

## 2013-11-10 NOTE — Procedures (Signed)
Objective Swallowing Evaluation: Modified Barium Swallowing Study  Patient Details  Name: Bethany Landry MRN: [id number] Date of Birth: 1920-12-11  Today's Date: 11/10/2013 Time: 1100-1135 SLP Time Calculation (min): 35 min  Past Medical History:  Past Medical History  Diagnosis Date  . Osteoporosis   . Benign hypertension   . Hyperlipidemia   . Osteoarthritis   . Insomnia   . Hypothyroidism   . Complete heart block   . Mastoiditis     hearing loss, s/p mastoiectomy, right ear  . DOE (dyspnea on exertion)   . CKD (chronic kidney disease), stage III     moderate  . Sinoatrial node dysfunction   . Fungal infection     recurrent   Past Surgical History:  Past Surgical History  Procedure Laterality Date  . Mastoidectomy Right     Tumor removal and residual recurrent fungal infection. Dr. Pollyann Kennedy  . Thyroidectomy    . Back surgery    . Laparotomy      Ex lap for abdominal pain (ischemic colitis)  . Insert / replace / remove pacemaker  11/26/09    St. Jude, DDD PM  . Cataract extraction     HPI:  80 year old female referred for OP MBS after having an Esophagram at [facility] during which the patient aspirated  Per patient and son, there were no other significant findings.  Patient c/o difficulty swallowing, with food sticking in her throat.PMH:  S/p pacemaker,  hypothyroidism, s/p back surgeries (multiple levels).  Patient resides in Independent Living.     Assessment / Plan / Recommendation Clinical Impression  Dysphagia Diagnosis: Mild pharyngeal phase dysphagia;Moderate pharyngeal phase dysphagia Clinical impression: Patient presents with a mild to moderate sensori-motor pharyngeal dysphagia as evidenced by decreased tongue base contraction to the pharyngeal wall, decreased laryngeal elevation, and decreased laryngeal closure, during the swallow, resulting in silent aspiration of thin liquids and moderate vallecular residue.  Patient had no sensation of aspiration, and  was unable to clough and clear aspirate on command.  All other consistencies passed through the pharyngeal phase without difficulty.  Compensatory strategies that proved effective were use of a tight chin tuck (to protect the airway) and double swallow     Treatment Recommendation  Defer treatment plan to SLP at (Comment) (home health)    Diet Recommendation Regular;Thin liquid (Change to nectar if unable to follow swallow strategies)   Liquid Administration via: Cup;No straw Medication Administration: Whole meds with puree Supervision: Intermittent supervision to cue for compensatory strategies Compensations: Slow rate;Small sips/bites;Multiple dry swallows after each bite/sip Postural Changes and/or Swallow Maneuvers: Seated upright 90 degrees;Chin tuck    Other  Recommendations Oral Care Recommendations: Oral care BID Other Recommendations: Clarify dietary restrictions   Follow Up Recommendations  Home health SLP    Frequency and Duration        Pertinent Vitals/Pain n/a    SLP Swallow Goals  Defer to home health SLP Home health SLP recommended to ensure carry-over of compensatory strategies.  Patient must consistently use chin tuck and double swallows to prevent aspiration with thin liquids.  If patient is unable to do so, diet should be changed to Nectar thick liquids.   General HPI: 80 year old female referred for OP MBS after having an Esophagram at [facility] during which the patient aspirated  Per patient and son, there were no other significant findings.  Patient c/o difficulty swallowing, with food sticking in her throat.PMH:  S/p pacemaker,  hypothyroidism, s/p back  surgeries (multiple levels).  Patient resides in Independent Living. Type of Study: Modified Barium Swallowing Study Reason for Referral: Objectively evaluate swallowing function Previous Swallow Assessment: Esophagram ~ one week ago, positive for aspiration. Diet Prior to this Study: Regular;Thin  liquids Temperature Spikes Noted: No Respiratory Status: Room air History of Recent Intubation: No Behavior/Cognition: Alert;Cooperative;Pleasant mood Oral Cavity - Dentition: Adequate natural dentition Oral Motor / Sensory Function: Within functional limits Self-Feeding Abilities: Able to feed self Patient Positioning: Upright in chair Baseline Vocal Quality: Clear Volitional Cough: Strong Volitional Swallow: Able to elicit Anatomy: Within functional limits (Osteophytes noted (slight impingement on esophagus)) Pharyngeal Secretions: Not observed secondary MBS    Reason for Referral Objectively evaluate swallowing function   Oral Phase Oral Preparation/Oral Phase Oral Phase: WFL   Pharyngeal Phase Pharyngeal Phase Pharyngeal Phase: Impaired Pharyngeal - Thin Pharyngeal - Thin Cup: Reduced anterior laryngeal mobility;Reduced laryngeal elevation;Reduced airway/laryngeal closure;Reduced tongue base retraction;Penetration/Aspiration during swallow;Pharyngeal residue - valleculae;Compensatory strategies attempted (Comment) (A tight chin tuck and double swallows prevented asp/residue) Penetration/Aspiration details (thin cup): Material enters airway, passes BELOW cords without attempt by patient to eject out (silent aspiration)  Cervical Esophageal Phase    GO    Cervical Esophageal Phase [facility]    Functional Assessment Tool Used: Clinical judgement Functional Limitations: Swallowing Swallow Current Status ([id number]): At least 20 percent but less than 40 percent impaired, limited or restricted Swallow Goal Status [phone]([id number]): At least 20 percent but less than 40 percent impaired, limited or restricted Swallow Discharge Status [phone]([id number]): At least 20 percent but less than 40 percent impaired, limited or restricted    Bethany Landry, Bethany Landry T 11/10/2013, 1:32 PM

## 2013-12-01 ENCOUNTER — Ambulatory Visit (INDEPENDENT_AMBULATORY_CARE_PROVIDER_SITE_OTHER): Payer: Medicare Other | Admitting: *Deleted

## 2013-12-01 ENCOUNTER — Encounter: Payer: Self-pay | Admitting: Internal Medicine

## 2013-12-01 DIAGNOSIS — I442 Atrioventricular block, complete: Secondary | ICD-10-CM | POA: Diagnosis not present

## 2013-12-01 LAB — MDC_IDC_ENUM_SESS_TYPE_REMOTE
Battery Remaining Longevity: 79 mo
Battery Remaining Percentage: 67 %
Brady Statistic AP VS Percent: 1 %
Brady Statistic AS VS Percent: 1 %
Brady Statistic RA Percent Paced: 55 %
Date Time Interrogation Session: 20150820060010
Implantable Pulse Generator Model: 2210
Implantable Pulse Generator Serial Number: 7154389
Lead Channel Impedance Value: 440 Ohm
Lead Channel Impedance Value: 690 Ohm
Lead Channel Sensing Intrinsic Amplitude: 1 mV
Lead Channel Setting Pacing Amplitude: 2.5 V
Lead Channel Setting Pacing Pulse Width: 0.4 ms
Lead Channel Setting Sensing Sensitivity: 4 mV
MDC IDC MSMT BATTERY VOLTAGE: 2.93 V
MDC IDC SET LEADCHNL RA PACING AMPLITUDE: 2 V
MDC IDC STAT BRADY AP VP PERCENT: 56 %
MDC IDC STAT BRADY AS VP PERCENT: 44 %
MDC IDC STAT BRADY RV PERCENT PACED: 99 %

## 2013-12-01 NOTE — Progress Notes (Signed)
Remote pacemaker transmission.   

## 2013-12-13 ENCOUNTER — Encounter: Payer: Self-pay | Admitting: Cardiology

## 2014-01-30 ENCOUNTER — Ambulatory Visit (INDEPENDENT_AMBULATORY_CARE_PROVIDER_SITE_OTHER): Payer: Medicare Other | Admitting: Ophthalmology

## 2014-01-30 DIAGNOSIS — I10 Essential (primary) hypertension: Secondary | ICD-10-CM

## 2014-01-30 DIAGNOSIS — H35033 Hypertensive retinopathy, bilateral: Secondary | ICD-10-CM

## 2014-01-30 DIAGNOSIS — H43813 Vitreous degeneration, bilateral: Secondary | ICD-10-CM

## 2014-01-30 DIAGNOSIS — H3531 Nonexudative age-related macular degeneration: Secondary | ICD-10-CM

## 2014-03-06 ENCOUNTER — Ambulatory Visit (INDEPENDENT_AMBULATORY_CARE_PROVIDER_SITE_OTHER): Payer: Medicare Other | Admitting: *Deleted

## 2014-03-06 DIAGNOSIS — I442 Atrioventricular block, complete: Secondary | ICD-10-CM | POA: Diagnosis not present

## 2014-03-06 LAB — MDC_IDC_ENUM_SESS_TYPE_REMOTE
Battery Remaining Longevity: 78 mo
Battery Voltage: 2.93 V
Brady Statistic AP VS Percent: 1 %
Brady Statistic AS VP Percent: 46 %
Date Time Interrogation Session: 20151123070012
Implantable Pulse Generator Model: 2210
Implantable Pulse Generator Serial Number: 7154389
Lead Channel Impedance Value: 410 Ohm
Lead Channel Impedance Value: 680 Ohm
Lead Channel Pacing Threshold Amplitude: 0.75 V
Lead Channel Pacing Threshold Pulse Width: 0.4 ms
Lead Channel Pacing Threshold Pulse Width: 0.4 ms
Lead Channel Sensing Intrinsic Amplitude: 12 mV
Lead Channel Sensing Intrinsic Amplitude: 5 mV
Lead Channel Setting Pacing Amplitude: 2.5 V
Lead Channel Setting Pacing Pulse Width: 0.4 ms
Lead Channel Setting Sensing Sensitivity: 4 mV
MDC IDC MSMT BATTERY REMAINING PERCENTAGE: 67 %
MDC IDC MSMT LEADCHNL RV PACING THRESHOLD AMPLITUDE: 0.75 V
MDC IDC SET LEADCHNL RA PACING AMPLITUDE: 2 V
MDC IDC STAT BRADY AP VP PERCENT: 54 %
MDC IDC STAT BRADY AS VS PERCENT: 1 %
MDC IDC STAT BRADY RA PERCENT PACED: 53 %
MDC IDC STAT BRADY RV PERCENT PACED: 99 %

## 2014-03-06 NOTE — Progress Notes (Signed)
Remote pacemaker transmission.   

## 2014-03-21 ENCOUNTER — Encounter: Payer: Self-pay | Admitting: Cardiology

## 2014-03-28 ENCOUNTER — Encounter: Payer: Self-pay | Admitting: Internal Medicine

## 2014-05-08 ENCOUNTER — Encounter: Payer: Self-pay | Admitting: Internal Medicine

## 2014-05-08 ENCOUNTER — Ambulatory Visit (INDEPENDENT_AMBULATORY_CARE_PROVIDER_SITE_OTHER): Payer: Medicare Other | Admitting: Internal Medicine

## 2014-05-08 VITALS — BP 144/76 | HR 65 | Ht 64.0 in | Wt 127.4 lb

## 2014-05-08 DIAGNOSIS — I442 Atrioventricular block, complete: Secondary | ICD-10-CM

## 2014-05-08 DIAGNOSIS — Z950 Presence of cardiac pacemaker: Secondary | ICD-10-CM

## 2014-05-08 DIAGNOSIS — I10 Essential (primary) hypertension: Secondary | ICD-10-CM

## 2014-05-08 LAB — MDC_IDC_ENUM_SESS_TYPE_INCLINIC
Battery Remaining Longevity: 84 mo
Battery Voltage: 2.92 V
Brady Statistic RA Percent Paced: 52 %
Implantable Pulse Generator Model: 2210
Implantable Pulse Generator Serial Number: 7154389
Lead Channel Impedance Value: 412.5 Ohm
Lead Channel Pacing Threshold Amplitude: 0.75 V
Lead Channel Pacing Threshold Amplitude: 0.75 V
Lead Channel Pacing Threshold Pulse Width: 0.4 ms
Lead Channel Sensing Intrinsic Amplitude: 12 mV
Lead Channel Setting Pacing Amplitude: 2.5 V
MDC IDC MSMT LEADCHNL RA PACING THRESHOLD PULSEWIDTH: 0.4 ms
MDC IDC MSMT LEADCHNL RA SENSING INTR AMPL: 1.1 mV
MDC IDC MSMT LEADCHNL RV IMPEDANCE VALUE: 662.5 Ohm
MDC IDC SESS DTM: 20160125124011
MDC IDC SET LEADCHNL RA PACING AMPLITUDE: 2 V
MDC IDC SET LEADCHNL RV PACING PULSEWIDTH: 0.4 ms
MDC IDC SET LEADCHNL RV SENSING SENSITIVITY: 4 mV
MDC IDC STAT BRADY RV PERCENT PACED: 99.76 %

## 2014-05-08 NOTE — Patient Instructions (Signed)
Your physician wants you to follow-up in: 12 months with Bethany BalsamAmber Seiler, NP You will receive a reminder letter in the mail two months in advance. If you don't receive a letter, please call our office to schedule the follow-up appointment.   Remote monitoring is used to monitor your Pacemaker or ICD from home. This monitoring reduces the number of office visits required to check your device to one time per year. It allows us to keep an eye on the functioning of your device to ensure it is working properly. You are scheduled for a device check from home on 08/07/14. You may send your transmission at any time that day. If you have a wireless device, the transmission will be sent automatically. After your physician reviews your transmission, you will receive a postcard with your next transmission date.

## 2014-05-08 NOTE — Progress Notes (Signed)
Primary Cardiologist:  Dr Ferne Reus is a 81 y.o. female with a h/o complete heart block sp PPM (SJM) by Dr Amador Cunas at [facility] who presents today to follow-up in the Electrophysiology device clinic.   The patient reports doing very well since her last visit and remains very active despite her age.  She is mostly limited by arthritis.  She walks with a cane.  She has chronic SOB which is not worse than baseline today.   Today, she  denies symptoms of palpitations, chest pain, orthopnea, PND, lower extremity edema, dizziness, presyncope, syncope, or neurologic sequela.  The patientis tolerating medications without difficulties and is otherwise without complaint today.   Past Medical History  Diagnosis Date  . Osteoporosis   . Benign hypertension   . Hyperlipidemia   . Osteoarthritis   . Insomnia   . Hypothyroidism   . Complete heart block   . Mastoiditis     hearing loss, s/p mastoiectomy, right ear  . DOE (dyspnea on exertion)   . CKD (chronic kidney disease), stage III     moderate  . Sinoatrial node dysfunction   . Fungal infection     recurrent   Past Surgical History  Procedure Laterality Date  . Mastoidectomy Right     Tumor removal and residual recurrent fungal infection. Dr. Pollyann Kennedy  . Thyroidectomy    . Back surgery    . Laparotomy      Ex lap for abdominal pain (ischemic colitis)  . Insert / replace / remove pacemaker  11/26/09    St. Jude, DDD PM  . Cataract extraction      History   Social History  . Marital Status: Divorced    Spouse Name: N/A    Number of Children: N/A  . Years of Education: N/A   Occupational History  . Not on file.   Social History Main Topics  . Smoking status: Former Smoker    Types: Cigarettes    Quit date: 04/14/1982  . Smokeless tobacco: Not on file  . Alcohol Use: Yes     Comment: wine, occasionally  . Drug Use: No  . Sexual Activity: Not on file   Other Topics Concern  . Not on file   Social History  Narrative    Family History  Problem Relation Age of Onset  . CAD Brother   . Cerebral aneurysm Brother   . Heart attack Mother     Allergies  Allergen Reactions  . Ace Inhibitors Swelling and Cough  . Norvasc [Amlodipine Besylate] Swelling    Swelling in ankles  . Quinapril Hcl Swelling    Current Outpatient Prescriptions  Medication Sig Dispense Refill  . aspirin EC 81 MG tablet Take 81 mg by mouth daily.    Marland Kitchen levothyroxine (SYNTHROID, LEVOTHROID) 150 MCG tablet Take 150 mcg by mouth daily before breakfast.    . metoprolol (LOPRESSOR) 50 MG tablet Take 1 tablet by mouth 2 (two) times daily.  3  . simvastatin (ZOCOR) 10 MG tablet Take 10 mg by mouth at bedtime.     . traMADol (ULTRAM) 50 MG tablet Take 1 tablet (50 mg total) by mouth every 6 (six) hours as needed for moderate pain or severe pain. 20 tablet 0  . PROAIR HFA 108 (90 BASE) MCG/ACT inhaler Inhale 2 puffs into the lungs every 4 (four) hours as needed. Shortness of breath or wheezing  6   No current facility-administered medications for this visit.  ROS- all systems are reviewed and negative except as per HPI  In addition, she has constipation, poor hearing, and occasional dizziness.   Physical Exam: Filed Vitals:   05/08/14 1200  BP: 144/76  Pulse: 65  Height: 5\' 4"  (1.626 m)  Weight: 127 lb 6.4 oz (57.788 kg)    GEN- The patient is elderly appearing, alert and oriented x 3 today.   Head- normocephalic, atraumatic Eyes-  Sclera clear, conjunctiva pink Ears- hearing intact Oropharynx- clear Neck- supple,  Lungs- Clear to ausculation bilaterally, normal work of breathing Chest- pacemaker pocket is well healed Heart- Regular rate and rhythm  GI- soft, NT, ND, + BS Extremities- no clubbing, cyanosis, or edema MS- diffuse muscular atrophy, walks slowly with a cane Neuro- strength and sensation are intact  Pacemaker interrogation- reviewed in detail today,  See PACEART report  Assessment and  Plan:  1. Complete heart block Normal pacemaker function though there has been some noise on the atrial 2088 lead--> we will follow this conservatively given her advanced age See Arita Missace Art report No changes today  2. HTN Stable No change required today  Merlin Return to see EP NP in the device clinic in 1 year Follow-up with Dr [name] as scheduled

## 2014-06-16 ENCOUNTER — Ambulatory Visit: Payer: Medicare Other | Admitting: Interventional Cardiology

## 2014-06-16 ENCOUNTER — Ambulatory Visit (INDEPENDENT_AMBULATORY_CARE_PROVIDER_SITE_OTHER): Payer: Medicare Other | Admitting: Interventional Cardiology

## 2014-06-16 ENCOUNTER — Encounter: Payer: Self-pay | Admitting: Interventional Cardiology

## 2014-06-16 VITALS — BP 190/92 | HR 60 | Ht 64.0 in | Wt 129.0 lb

## 2014-06-16 DIAGNOSIS — Z950 Presence of cardiac pacemaker: Secondary | ICD-10-CM

## 2014-06-16 DIAGNOSIS — I10 Essential (primary) hypertension: Secondary | ICD-10-CM

## 2014-06-16 DIAGNOSIS — I442 Atrioventricular block, complete: Secondary | ICD-10-CM

## 2014-06-16 MED ORDER — HYDROCHLOROTHIAZIDE 12.5 MG PO CAPS: 12.5000 mg | ORAL_CAPSULE | Freq: Every day | ORAL | Status: DC

## 2014-06-16 NOTE — Patient Instructions (Signed)
Your physician has recommended you make the following change in your medication:  1) START HCTZ 12.5mg  daily. An Rx has been sent to your pharmacy  Your physician recommends that you return for lab work in: 2 weeks with Bmet  Your physician has requested that you regularly monitor and record your blood pressure readings at home. Please use the same machine at the same time of day to check your readings and record them.(Check your blood pressure twice a week, and call the office with 2 weeks of readings)  Your physician wants you to follow-up in: 1 year with Dr.Sherod You will receive a reminder letter in the mail two months in advance. If you don't receive a letter, please call our office to schedule the follow-up appointment.

## 2014-06-16 NOTE — Progress Notes (Signed)
Cardiology Office Note   Date:  06/16/2014   ID:  Bethany Landry, DOB Sep 29, 1920, MRN [id number]  PCP:  Orland Penman, MD  Cardiologist:   Lesleigh Noe, MD   No chief complaint on file.     History of Present Illness: Bethany Landry is a 81 y.o. female who presents for elevated blood pressure, exertional dyspnea, and complete heart block. Other than exertional dyspnea she is doing well. She denies syncope. No palpitations. Her blood pressure medications have been recently adjusted with hydrochlorothiazide being taken away completely. She is now on 50 mg twice a day of metoprolol. Blood pressures of been high with systolics in the 170-200 mmHg range.    Past Medical History  Diagnosis Date  . Osteoporosis   . Benign hypertension   . Hyperlipidemia   . Osteoarthritis   . Insomnia   . Hypothyroidism   . Complete heart block   . Mastoiditis     hearing loss, s/p mastoiectomy, right ear  . DOE (dyspnea on exertion)   . CKD (chronic kidney disease), stage III     moderate  . Sinoatrial node dysfunction   . Fungal infection     recurrent    Past Surgical History  Procedure Laterality Date  . Mastoidectomy Right     Tumor removal and residual recurrent fungal infection. Dr. Pollyann Kennedy  . Thyroidectomy    . Back surgery    . Laparotomy      Ex lap for abdominal pain (ischemic colitis)  . Insert / replace / remove pacemaker  11/26/09    St. Jude, DDD PM  . Cataract extraction       Current Outpatient Prescriptions  Medication Sig Dispense Refill  . aspirin EC 81 MG tablet Take 81 mg by mouth daily.    Marland Kitchen levothyroxine (SYNTHROID, LEVOTHROID) 150 MCG tablet Take 150 mcg by mouth daily before breakfast.    . metoprolol (LOPRESSOR) 50 MG tablet Take 1 tablet by mouth 2 (two) times daily.  3  . PROAIR HFA 108 (90 BASE) MCG/ACT inhaler Inhale 2 puffs into the lungs every 4 (four) hours as needed. Shortness of breath or wheezing  6  . simvastatin (ZOCOR) 10 MG  tablet Take 10 mg by mouth at bedtime.      No current facility-administered medications for this visit.    Allergies:   Ace inhibitors; Norvasc; and Quinapril hcl    Social History:  The patient  reports that she quit smoking about 32 years ago. Her smoking use included Cigarettes. She does not have any smokeless tobacco history on file. She reports that she drinks alcohol. She reports that she does not use illicit drugs.   Family History:  The patient's family history includes CAD in her brother; Cerebral aneurysm in her brother; Heart attack in her mother.    ROS:  Please see the history of present illness.   Otherwise, review of systems are positive for insomnia.   All other systems are reviewed and negative.    PHYSICAL EXAM: VS:  BP 190/92 mmHg  Pulse 60  Ht  (1.626 m)  Wt 129 lb (58.514 kg)  BMI 22.13 kg/m2 , BMI Body mass index is 22.13 kg/(m^2). GEN: Well nourished, well developed, in no acute distress HEENT: normal Neck: no JVD, carotid bruits, or masses Cardiac: RRR; no murmurs, rubs, or gallops,no edema  Respiratory:  clear to auscultation bilaterally, normal work of breathing GI: soft, nontender, nondistended, + BS MS: no deformity or  atrophy Skin: warm and dry, no rash Neuro:  Strength and sensation are intact Psych: euthymic mood, full affect   EKG:  EKG is ordered today. The ekg ordered today demonstrates AV sequential pacing   Recent Labs: 10/12/2013: BUN 34*; Creatinine 1.23*; Hemoglobin 10.0*; Platelets 211; Potassium 3.8; Sodium 130*    Lipid Panel No results found for: CHOL, TRIG, HDL, CHOLHDL, VLDL, LDLCALC, LDLDIRECT    Wt Readings from Last 3 Encounters:  06/16/14 129 lb (58.514 kg)  05/08/14 127 lb 6.4 oz (57.788 kg)  06/14/13 137 lb (62.143 kg)      Other studies Reviewed: Additional studies/ records that were reviewed today include: . Review of the above records demonstrates: Reviewed prior labs   ASSESSMENT AND  PLAN:  Essential hypertension: Poor control  Cardiac pacemaker : Normal function  Complete heart block: Treated with pacer     Current medicines are reviewed at length with the patient today.  The patient has concerns regarding medicines.  The following changes have been made:  Start hydrochlorothiazide 12.5 mg per day. 2 week later basic metabolic panel.  Labs/ tests ordered today include:  No orders of the defined types were placed in this encounter.     Disposition:   FU with Mendel RyderH. Steppe in 1 year   Signed, Lesleigh NoeSMITH III,Aubriauna Riner W, MD  06/16/2014 2:11 PM    [facility] [address], [place], [place]  [address] Phone: [phone]; Fax: [phone]

## 2014-06-26 ENCOUNTER — Other Ambulatory Visit: Payer: PRIVATE HEALTH INSURANCE

## 2014-07-04 ENCOUNTER — Other Ambulatory Visit (INDEPENDENT_AMBULATORY_CARE_PROVIDER_SITE_OTHER): Payer: Medicare Other | Admitting: *Deleted

## 2014-07-04 DIAGNOSIS — I10 Essential (primary) hypertension: Secondary | ICD-10-CM | POA: Diagnosis not present

## 2014-07-04 LAB — BASIC METABOLIC PANEL
BUN: 26 mg/dL — ABNORMAL HIGH (ref 6–23)
CHLORIDE: 94 meq/L — AB (ref 96–112)
CO2: 32 mEq/L (ref 19–32)
CREATININE: 1.26 mg/dL — AB (ref 0.40–1.20)
Calcium: 9.3 mg/dL (ref 8.4–10.5)
GFR: 42.19 mL/min — ABNORMAL LOW (ref >60.00)
Glucose, Bld: 97 mg/dL (ref 70–99)
Potassium: 4.4 mEq/L (ref 3.5–5.1)
SODIUM: 129 meq/L — AB (ref 135–145)

## 2014-07-04 NOTE — Addendum Note (Signed)
Addended by: Edmond Ginsberg K on: 07/04/2014 02:03 PM   Modules accepted: Orders  

## 2014-07-04 NOTE — Addendum Note (Signed)
Addended by: Tonita PhoenixBOWDEN, ROBIN K on: 07/04/2014 02:03 PM   Modules accepted: Orders

## 2014-07-06 ENCOUNTER — Telehealth: Payer: Self-pay

## 2014-07-06 NOTE — Telephone Encounter (Signed)
pt aware of lab results.Kidney function and sodium are mildly abnormal but stable when compared to laboratory data from 8 months ago Pt verbalized understanding.

## 2014-07-06 NOTE — Telephone Encounter (Signed)
-----   Message from Lyn RecordsHenry W Joye, MD sent at 07/04/2014  5:22 PM EDT ----- Kidney function and sodium are mildly abnormal but stable when compared to laboratory data from 8 months ago

## 2014-08-07 ENCOUNTER — Ambulatory Visit (INDEPENDENT_AMBULATORY_CARE_PROVIDER_SITE_OTHER): Payer: Medicare Other | Admitting: *Deleted

## 2014-08-07 ENCOUNTER — Encounter: Payer: Self-pay | Admitting: Internal Medicine

## 2014-08-07 DIAGNOSIS — I442 Atrioventricular block, complete: Secondary | ICD-10-CM

## 2014-08-07 LAB — MDC_IDC_ENUM_SESS_TYPE_REMOTE
Battery Remaining Percentage: 59 %
Brady Statistic AP VP Percent: 69 %
Brady Statistic AP VS Percent: 1 %
Brady Statistic AS VP Percent: 30 %
Brady Statistic AS VS Percent: 1 %
Implantable Pulse Generator Model: 2210
Implantable Pulse Generator Serial Number: 7154389
Lead Channel Impedance Value: 690 Ohm
Lead Channel Pacing Threshold Amplitude: 0.75 V
Lead Channel Setting Pacing Amplitude: 2 V
Lead Channel Setting Pacing Amplitude: 2.5 V
MDC IDC MSMT BATTERY REMAINING LONGEVITY: 68 mo
MDC IDC MSMT BATTERY VOLTAGE: 2.92 V
MDC IDC MSMT LEADCHNL RA IMPEDANCE VALUE: 430 Ohm
MDC IDC MSMT LEADCHNL RA PACING THRESHOLD PULSEWIDTH: 0.4 ms
MDC IDC MSMT LEADCHNL RA SENSING INTR AMPL: 1.5 mV
MDC IDC MSMT LEADCHNL RV PACING THRESHOLD AMPLITUDE: 0.75 V
MDC IDC MSMT LEADCHNL RV PACING THRESHOLD PULSEWIDTH: 0.4 ms
MDC IDC MSMT LEADCHNL RV SENSING INTR AMPL: 12 mV
MDC IDC SESS DTM: 20160425060010
MDC IDC SET LEADCHNL RV PACING PULSEWIDTH: 0.4 ms
MDC IDC SET LEADCHNL RV SENSING SENSITIVITY: 4 mV
MDC IDC STAT BRADY RA PERCENT PACED: 69 %
MDC IDC STAT BRADY RV PERCENT PACED: 99 %

## 2014-08-07 NOTE — Progress Notes (Signed)
Remote pacemaker transmission.   

## 2014-08-15 ENCOUNTER — Encounter: Payer: Self-pay | Admitting: Cardiology

## 2014-11-06 ENCOUNTER — Ambulatory Visit (INDEPENDENT_AMBULATORY_CARE_PROVIDER_SITE_OTHER): Payer: Medicare Other | Admitting: *Deleted

## 2014-11-06 ENCOUNTER — Other Ambulatory Visit: Payer: Self-pay | Admitting: Internal Medicine

## 2014-11-06 DIAGNOSIS — I442 Atrioventricular block, complete: Secondary | ICD-10-CM | POA: Diagnosis not present

## 2014-11-06 LAB — CUP PACEART REMOTE DEVICE CHECK
Battery Remaining Longevity: 83 mo
Brady Statistic AP VP Percent: 68 %
Brady Statistic AP VS Percent: 1 %
Brady Statistic AS VS Percent: 1 %
Brady Statistic RA Percent Paced: 68 %
Brady Statistic RV Percent Paced: 99 %
Date Time Interrogation Session: 20160725060013
Lead Channel Impedance Value: 400 Ohm
Lead Channel Pacing Threshold Amplitude: 0.75 V
Lead Channel Pacing Threshold Amplitude: 0.75 V
Lead Channel Pacing Threshold Pulse Width: 0.4 ms
Lead Channel Sensing Intrinsic Amplitude: 12 mV
Lead Channel Setting Pacing Pulse Width: 0.4 ms
Lead Channel Setting Sensing Sensitivity: 4 mV
MDC IDC MSMT BATTERY REMAINING PERCENTAGE: 73 %
MDC IDC MSMT BATTERY VOLTAGE: 2.92 V
MDC IDC MSMT LEADCHNL RA SENSING INTR AMPL: 1.8 mV
MDC IDC MSMT LEADCHNL RV IMPEDANCE VALUE: 710 Ohm
MDC IDC MSMT LEADCHNL RV PACING THRESHOLD PULSEWIDTH: 0.4 ms
MDC IDC SET LEADCHNL RA PACING AMPLITUDE: 2 V
MDC IDC SET LEADCHNL RV PACING AMPLITUDE: 2.5 V
MDC IDC STAT BRADY AS VP PERCENT: 31 %
Pulse Gen Model: 2210
Pulse Gen Serial Number: 7154389

## 2014-11-16 NOTE — Progress Notes (Signed)
Remote pacemaker transmission.   

## 2014-11-17 ENCOUNTER — Ambulatory Visit: Payer: Medicare Other | Admitting: Podiatry

## 2014-11-23 ENCOUNTER — Ambulatory Visit (INDEPENDENT_AMBULATORY_CARE_PROVIDER_SITE_OTHER): Payer: Medicare Other | Admitting: Podiatry

## 2014-11-23 ENCOUNTER — Encounter: Payer: Self-pay | Admitting: Podiatry

## 2014-11-23 VITALS — BP 174/90 | HR 60 | Temp 97.7°F | Resp 12

## 2014-11-23 DIAGNOSIS — M79676 Pain in unspecified toe(s): Secondary | ICD-10-CM | POA: Diagnosis not present

## 2014-11-23 DIAGNOSIS — B351 Tinea unguium: Secondary | ICD-10-CM

## 2014-11-23 NOTE — Progress Notes (Signed)
   Subjective:    Patient ID: Bethany Landry, female    DOB: 1921/02/05, 81 y.o.   MRN: [id number]  HPI Patient presents here with right 5th toe pain and B/L toenail trim. This patient presents to the office for preventive foot care services.  She needs her nails done and she has painful corn between her fourth and fifth toes right foot.  Review of Systems  HENT: Positive for hearing loss and trouble swallowing.   Gastrointestinal: Positive for constipation.       Objective:   Physical Exam GENERAL APPEARANCE: Alert, conversant. Appropriately groomed. No acute distress.  VASCULAR: Pedal pulses palpable at  DP and PT bilateral.  Capillary refill time is immediate to all digits,   NEUROLOGIC: sensation is normal to 5.07 monofilament at 5/5 sites bilateral.  Light touch is intact bilateral, Muscle strength normal.  MUSCULOSKELETAL: acceptable muscle strength, tone and stability bilateral.  Intrinsic muscluature intact bilateral.  Rectus appearance of foot and digits noted bilateral.   DERMATOLOGIC: skin color, texture, and turgor are within normal limits.  No preulcerative lesions or ulcers  are seen, no interdigital maceration noted.  No open lesions present.  . No drainage noted. NAILS  Long thick big toenails both feet.         Assessment & Plan:  Onychomycosis Hallux B/l  Tx:  Debridement and grinding of long painful nails.

## 2014-11-27 ENCOUNTER — Encounter: Payer: Self-pay | Admitting: Cardiology

## 2014-11-30 ENCOUNTER — Encounter: Payer: Self-pay | Admitting: Internal Medicine

## 2015-02-05 ENCOUNTER — Ambulatory Visit (INDEPENDENT_AMBULATORY_CARE_PROVIDER_SITE_OTHER): Payer: Medicare Other | Admitting: *Deleted

## 2015-02-05 DIAGNOSIS — I442 Atrioventricular block, complete: Secondary | ICD-10-CM

## 2015-02-05 NOTE — Progress Notes (Signed)
Remote pacemaker transmission.   

## 2015-02-08 ENCOUNTER — Encounter: Payer: Self-pay | Admitting: Cardiology

## 2015-02-08 LAB — CUP PACEART REMOTE DEVICE CHECK
Battery Remaining Longevity: 85 mo
Brady Statistic AP VS Percent: 1 %
Brady Statistic RV Percent Paced: 99 %
Date Time Interrogation Session: 20161024074112
Implantable Lead Implant Date: 20110815
Implantable Lead Location: 753860
Lead Channel Impedance Value: 440 Ohm
Lead Channel Pacing Threshold Amplitude: 0.75 V
Lead Channel Setting Pacing Pulse Width: 0.4 ms
Lead Channel Setting Sensing Sensitivity: 4 mV
MDC IDC LEAD IMPLANT DT: 20110815
MDC IDC LEAD LOCATION: 753859
MDC IDC LEAD MODEL: 1948
MDC IDC MSMT BATTERY REMAINING PERCENTAGE: 73 %
MDC IDC MSMT BATTERY VOLTAGE: 2.92 V
MDC IDC MSMT LEADCHNL RA PACING THRESHOLD AMPLITUDE: 0.75 V
MDC IDC MSMT LEADCHNL RA PACING THRESHOLD PULSEWIDTH: 0.4 ms
MDC IDC MSMT LEADCHNL RA SENSING INTR AMPL: 1.8 mV
MDC IDC MSMT LEADCHNL RV IMPEDANCE VALUE: 690 Ohm
MDC IDC MSMT LEADCHNL RV PACING THRESHOLD PULSEWIDTH: 0.4 ms
MDC IDC MSMT LEADCHNL RV SENSING INTR AMPL: 12 mV
MDC IDC SET LEADCHNL RA PACING AMPLITUDE: 2 V
MDC IDC SET LEADCHNL RV PACING AMPLITUDE: 2.5 V
MDC IDC STAT BRADY AP VP PERCENT: 70 %
MDC IDC STAT BRADY AS VP PERCENT: 30 %
MDC IDC STAT BRADY AS VS PERCENT: 1 %
MDC IDC STAT BRADY RA PERCENT PACED: 69 %
Pulse Gen Model: 2210
Pulse Gen Serial Number: 7154389

## 2015-02-19 ENCOUNTER — Ambulatory Visit (INDEPENDENT_AMBULATORY_CARE_PROVIDER_SITE_OTHER): Payer: Medicare Other | Admitting: Ophthalmology

## 2015-02-19 DIAGNOSIS — H35033 Hypertensive retinopathy, bilateral: Secondary | ICD-10-CM

## 2015-02-19 DIAGNOSIS — H43813 Vitreous degeneration, bilateral: Secondary | ICD-10-CM | POA: Diagnosis not present

## 2015-02-19 DIAGNOSIS — I10 Essential (primary) hypertension: Secondary | ICD-10-CM

## 2015-02-19 DIAGNOSIS — H353124 Nonexudative age-related macular degeneration, left eye, advanced atrophic with subfoveal involvement: Secondary | ICD-10-CM | POA: Diagnosis not present

## 2015-02-19 DIAGNOSIS — H353112 Nonexudative age-related macular degeneration, right eye, intermediate dry stage: Secondary | ICD-10-CM | POA: Diagnosis not present

## 2015-02-22 ENCOUNTER — Ambulatory Visit: Payer: Medicare Other | Admitting: Podiatry

## 2015-03-01 ENCOUNTER — Ambulatory Visit (INDEPENDENT_AMBULATORY_CARE_PROVIDER_SITE_OTHER): Payer: Medicare Other | Admitting: Podiatry

## 2015-03-01 ENCOUNTER — Encounter: Payer: Self-pay | Admitting: Podiatry

## 2015-03-01 DIAGNOSIS — B351 Tinea unguium: Secondary | ICD-10-CM

## 2015-03-01 DIAGNOSIS — M79676 Pain in unspecified toe(s): Secondary | ICD-10-CM | POA: Diagnosis not present

## 2015-03-01 NOTE — Addendum Note (Signed)
Addended by: Harlon FlorSOUTHERLAND, Teanna Elem L on: 03/01/2015 04:27 PM   Modules accepted: Medications

## 2015-03-01 NOTE — Progress Notes (Signed)
Patient ID: Bethany Landry, female   DOB: 1921-01-18, 81 y.o.   MRN: [id number] Complaint:  Visit Type: Patient returns to my office for continued preventative foot care services. Complaint: Patient states" my nails have grown long and thick and become painful to walk and wear shoes". The patient presents for preventative foot care services. No changes to ROS  Podiatric Exam: Vascular: dorsalis pedis and posterior tibial pulses are palpable bilateral. Capillary return is immediate. Temperature gradient is WNL. Skin turgor WNL  Sensorium: Normal Semmes Weinstein monofilament test. Normal tactile sensation bilaterally. Nail Exam: Pt has thick disfigured discolored nails with subungual debris noted bilateral entire nail hallux  Ulcer Exam: There is no evidence of ulcer or pre-ulcerative changes or infection. Orthopedic Exam: Muscle tone and strength are WNL. No limitations in general ROM. No crepitus or effusions noted. Foot type and digits show no abnormalities. Bony prominences are unremarkable. Skin: No Porokeratosis. No infection or ulcers  Diagnosis:  Onychomycosis, , Pain in right toe, pain in left toes  Treatment & Plan Procedures and Treatment: Consent by patient was obtained for treatment procedures. The patient understood the discussion of treatment and procedures well. All questions were answered thoroughly reviewed. Debridement of mycotic and hypertrophic toenails, 1 through 5 bilateral and clearing of subungual debris. No ulceration, no infection noted.  Return Visit-Office Procedure: Patient instructed to return to the office for a follow up visit 3 months for continued evaluation and treatment.    Helane GuntherGregory Deakon Frix DPM

## 2015-05-04 ENCOUNTER — Encounter (HOSPITAL_COMMUNITY): Payer: Self-pay

## 2015-05-04 ENCOUNTER — Observation Stay (HOSPITAL_COMMUNITY)
Admission: EM | Admit: 2015-05-04 | Discharge: 2015-05-05 | Disposition: A | Discharge status: Nursing Home (Includes ICF) | Payer: Medicare Other | Attending: Family Medicine | Admitting: Family Medicine

## 2015-05-04 ENCOUNTER — Emergency Department (HOSPITAL_COMMUNITY): Payer: Medicare Other

## 2015-05-04 DIAGNOSIS — S0100XA Unspecified open wound of scalp, initial encounter: Secondary | ICD-10-CM | POA: Diagnosis not present

## 2015-05-04 DIAGNOSIS — S0101XA Laceration without foreign body of scalp, initial encounter: Secondary | ICD-10-CM | POA: Diagnosis present

## 2015-05-04 DIAGNOSIS — W19XXXA Unspecified fall, initial encounter: Secondary | ICD-10-CM | POA: Insufficient documentation

## 2015-05-04 DIAGNOSIS — E785 Hyperlipidemia, unspecified: Secondary | ICD-10-CM | POA: Insufficient documentation

## 2015-05-04 DIAGNOSIS — IMO0001 Reserved for inherently not codable concepts without codable children: Secondary | ICD-10-CM | POA: Diagnosis present

## 2015-05-04 DIAGNOSIS — J849 Interstitial pulmonary disease, unspecified: Secondary | ICD-10-CM | POA: Diagnosis not present

## 2015-05-04 DIAGNOSIS — E039 Hypothyroidism, unspecified: Secondary | ICD-10-CM | POA: Insufficient documentation

## 2015-05-04 DIAGNOSIS — R55 Syncope and collapse: Secondary | ICD-10-CM | POA: Diagnosis not present

## 2015-05-04 DIAGNOSIS — Z7982 Long term (current) use of aspirin: Secondary | ICD-10-CM | POA: Insufficient documentation

## 2015-05-04 DIAGNOSIS — N183 Chronic kidney disease, stage 3 unspecified: Secondary | ICD-10-CM | POA: Diagnosis present

## 2015-05-04 DIAGNOSIS — I129 Hypertensive chronic kidney disease with stage 1 through stage 4 chronic kidney disease, or unspecified chronic kidney disease: Secondary | ICD-10-CM | POA: Diagnosis not present

## 2015-05-04 DIAGNOSIS — R030 Elevated blood-pressure reading, without diagnosis of hypertension: Secondary | ICD-10-CM | POA: Diagnosis not present

## 2015-05-04 DIAGNOSIS — Z79899 Other long term (current) drug therapy: Secondary | ICD-10-CM | POA: Insufficient documentation

## 2015-05-04 DIAGNOSIS — R0600 Dyspnea, unspecified: Secondary | ICD-10-CM | POA: Insufficient documentation

## 2015-05-04 DIAGNOSIS — Z87891 Personal history of nicotine dependence: Secondary | ICD-10-CM | POA: Diagnosis not present

## 2015-05-04 DIAGNOSIS — E871 Hypo-osmolality and hyponatremia: Secondary | ICD-10-CM | POA: Diagnosis not present

## 2015-05-04 DIAGNOSIS — I442 Atrioventricular block, complete: Secondary | ICD-10-CM | POA: Insufficient documentation

## 2015-05-04 DIAGNOSIS — M810 Age-related osteoporosis without current pathological fracture: Secondary | ICD-10-CM | POA: Insufficient documentation

## 2015-05-04 DIAGNOSIS — I10 Essential (primary) hypertension: Secondary | ICD-10-CM | POA: Diagnosis present

## 2015-05-04 DIAGNOSIS — Z950 Presence of cardiac pacemaker: Secondary | ICD-10-CM | POA: Diagnosis present

## 2015-05-04 DIAGNOSIS — Z9181 History of falling: Secondary | ICD-10-CM | POA: Diagnosis not present

## 2015-05-04 DIAGNOSIS — M1990 Unspecified osteoarthritis, unspecified site: Secondary | ICD-10-CM | POA: Insufficient documentation

## 2015-05-04 LAB — URINALYSIS, ROUTINE W REFLEX MICROSCOPIC
Bilirubin Urine: NEGATIVE
GLUCOSE, UA: NEGATIVE mg/dL
Ketones, ur: NEGATIVE mg/dL
Nitrite: NEGATIVE
PROTEIN: NEGATIVE mg/dL
SPECIFIC GRAVITY, URINE: 1.011 (ref 1.005–1.030)
pH: 7.5 (ref 5.0–8.0)

## 2015-05-04 LAB — CBC
HCT: 36.4 % (ref 36.0–46.0)
HCT: 34.1 % — ABNORMAL LOW (ref 36.0–46.0)
Hemoglobin: 12.4 g/dL (ref 12.0–15.0)
Hemoglobin: 11.8 g/dL — ABNORMAL LOW (ref 12.0–15.0)
MCH: 30.2 pg (ref 26.0–34.0)
MCH: 30.3 pg (ref 26.0–34.0)
MCHC: 34.1 g/dL (ref 30.0–36.0)
MCHC: 34.6 g/dL (ref 30.0–36.0)
MCV: 88.6 fL (ref 78.0–100.0)
MCV: 87.4 fL (ref 78.0–100.0)
PLATELETS: 308 10*3/uL (ref 150–400)
PLATELETS: 305 10*3/uL (ref 150–400)
RBC: 4.11 MIL/uL (ref 3.87–5.11)
RBC: 3.9 MIL/uL (ref 3.87–5.11)
RDW: 13.3 % (ref 11.5–15.5)
RDW: 13.3 % (ref 11.5–15.5)
WBC: 8.9 10*3/uL (ref 4.0–10.5)
WBC: 11.3 10*3/uL — AB (ref 4.0–10.5)

## 2015-05-04 LAB — URINE MICROSCOPIC-ADD ON

## 2015-05-04 LAB — I-STAT CHEM 8, ED
BUN: 26 mg/dL — ABNORMAL HIGH (ref 6–20)
CALCIUM ION: 1.13 mmol/L (ref 1.13–1.30)
CHLORIDE: 93 mmol/L — AB (ref 101–111)
Creatinine, Ser: 1.1 mg/dL — ABNORMAL HIGH (ref 0.44–1.00)
Glucose, Bld: 113 mg/dL — ABNORMAL HIGH (ref 65–99)
HEMATOCRIT: 42 % (ref 36.0–46.0)
Hemoglobin: 14.3 g/dL (ref 12.0–15.0)
POTASSIUM: 4.1 mmol/L (ref 3.5–5.1)
SODIUM: 132 mmol/L — AB (ref 135–145)
TCO2: 27 mmol/L (ref 0–100)

## 2015-05-04 LAB — TSH: TSH: 6.622 u[IU]/mL — AB (ref 0.350–4.500)

## 2015-05-04 MED ORDER — HYDRALAZINE HCL 10 MG PO TABS
10.0000 mg | ORAL_TABLET | Freq: Once | ORAL | Status: AC
  Administered 2015-05-04: 10 mg via ORAL
  Filled 2015-05-04: qty 1

## 2015-05-04 MED ORDER — LIDOCAINE-EPINEPHRINE 1 %-1:100000 IJ SOLN
20.0000 mL | Freq: Once | INTRAMUSCULAR | Status: AC
  Administered 2015-05-04: 1 mL via INTRADERMAL
  Filled 2015-05-04: qty 1

## 2015-05-04 MED ORDER — SODIUM CHLORIDE 0.9 % IJ SOLN
3.0000 mL | Freq: Two times a day (BID) | INTRAMUSCULAR | Status: DC
  Administered 2015-05-04 – 2015-05-05 (×2): 3 mL via INTRAVENOUS

## 2015-05-04 MED ORDER — ASPIRIN EC 81 MG PO TBEC
81.0000 mg | DELAYED_RELEASE_TABLET | Freq: Every day | ORAL | Status: DC
  Administered 2015-05-04 – 2015-05-05 (×2): 81 mg via ORAL
  Filled 2015-05-04 (×2): qty 1

## 2015-05-04 MED ORDER — ALBUTEROL SULFATE HFA 108 (90 BASE) MCG/ACT IN AERS
2.0000 | INHALATION_SPRAY | RESPIRATORY_TRACT | Status: DC | PRN
Start: 2015-05-04 — End: 2015-05-04

## 2015-05-04 MED ORDER — ACETAMINOPHEN 325 MG PO TABS: 650.0000 mg | ORAL_TABLET | ORAL | Status: DC | PRN

## 2015-05-04 MED ORDER — LEVOTHYROXINE SODIUM 25 MCG PO TABS
125.0000 ug | ORAL_TABLET | Freq: Every day | ORAL | Status: DC
  Administered 2015-05-04 – 2015-05-05 (×2): 125 ug via ORAL
  Filled 2015-05-04 (×2): qty 1

## 2015-05-04 MED ORDER — ACETAMINOPHEN 325 MG PO TABS: 650.0000 mg | ORAL_TABLET | Freq: Four times a day (QID) | ORAL | Status: DC | PRN

## 2015-05-04 MED ORDER — OXYCODONE HCL 5 MG PO TABS
5.0000 mg | ORAL_TABLET | Freq: Four times a day (QID) | ORAL | Status: DC | PRN
  Administered 2015-05-04: 5 mg via ORAL
  Filled 2015-05-04: qty 1

## 2015-05-04 MED ORDER — METOPROLOL TARTRATE 50 MG PO TABS
50.0000 mg | ORAL_TABLET | Freq: Two times a day (BID) | ORAL | Status: DC
  Administered 2015-05-04 – 2015-05-05 (×2): 50 mg via ORAL
  Filled 2015-05-04 (×2): qty 1

## 2015-05-04 MED ORDER — ENOXAPARIN SODIUM 30 MG/0.3ML ~~LOC~~ SOLN
30.0000 mg | SUBCUTANEOUS | Status: DC
  Administered 2015-05-04: 30 mg via SUBCUTANEOUS
  Filled 2015-05-04: qty 0.3

## 2015-05-04 NOTE — ED Notes (Signed)
Attempted report 

## 2015-05-04 NOTE — ED Notes (Signed)
MD at bedside. 

## 2015-05-04 NOTE — ED Notes (Signed)
Cardiology at bedside.

## 2015-05-04 NOTE — ED Notes (Signed)
Pt's son at bedside, his number is recorded to call with updates on pt.

## 2015-05-04 NOTE — ED Notes (Signed)
[facility]- pt here from [facility] after a fall. Pt does not remember falling but last remembers walking to get prune juice. Pt alert and oriented with EMS. C-collar in place, lac noted to back of the head.

## 2015-05-04 NOTE — ED Provider Notes (Signed)
CSN: [id number]     Arrival date & time 05/04/15  9798 History   First MD Initiated Contact with Patient 05/04/15 0901     Chief Complaint  Patient presents with  . Fall     (Consider location/radiation/quality/duration/timing/severity/associated sxs/prior Treatment) Patient is a 82 y.o. female presenting with fall. The history is provided by the patient and the EMS personnel.  Fall This is a new problem. The current episode started today. The problem occurs intermittently. The problem has been unchanged. Associated symptoms include headaches. Pertinent negatives include no abdominal pain, chest pain, coughing, nausea, neck pain, rash or visual change. Nothing aggravates the symptoms. She has tried nothing for the symptoms. The treatment provided no relief.    Past Medical History  Diagnosis Date  . Osteoporosis   . Benign hypertension   . Hyperlipidemia   . Osteoarthritis   . Insomnia   . Hypothyroidism   . Complete heart block (HCC)   . Mastoiditis     hearing loss, s/p mastoiectomy, right ear  . DOE (dyspnea on exertion)   . CKD (chronic kidney disease), stage III     moderate  . Sinoatrial node dysfunction (HCC)   . Fungal infection     recurrent   Past Surgical History  Procedure Laterality Date  . Mastoidectomy Right     Tumor removal and residual recurrent fungal infection. Dr. Pollyann Kennedy  . Thyroidectomy    . Back surgery    . Laparotomy      Ex lap for abdominal pain (ischemic colitis)  . Insert / replace / remove pacemaker  11/26/09    [employer], DDD PM  . Cataract extraction     Family History  Problem Relation Age of Onset  . CAD Brother   . Cerebral aneurysm Brother   . Heart attack Mother    Social History  Substance Use Topics  . Smoking status: Former Smoker    Types: Cigarettes    Quit date: 04/14/1982  . Smokeless tobacco: None  . Alcohol Use: Yes     Comment: wine, occasionally   OB History    No data available     Review of Systems   Constitutional: Negative.   Eyes: Negative.   Respiratory: Negative for cough.   Cardiovascular: Negative for chest pain.  Gastrointestinal: Negative for nausea and abdominal pain.  Genitourinary: Negative.   Musculoskeletal: Negative for neck pain.  Skin: Positive for wound. Negative for pallor and rash.  Neurological: Positive for headaches. Negative for seizures.  Psychiatric/Behavioral: Negative.       Allergies  Ace inhibitors; Norvasc; and Quinapril hcl  Home Medications   Prior to Admission medications   Medication Sig Start Date End Date Taking? Authorizing Provider  acetaminophen (TYLENOL) 325 MG tablet Take 650 mg by mouth every 6 (six) hours as needed for mild pain.   Yes Historical Provider, MD  aspirin EC 81 MG tablet Take 81 mg by mouth daily.   Yes Historical Provider, MD  hydrochlorothiazide (MICROZIDE) 12.5 MG capsule Take 1 capsule (12.5 mg total) by mouth daily. 06/16/14  Yes Lyn Records, MD  levothyroxine (SYNTHROID, LEVOTHROID) 125 MCG tablet TK 1 T PO QD 11/17/14  Yes Historical Provider, MD  metoprolol (LOPRESSOR) 50 MG tablet Take 1 tablet by mouth 2 (two) times daily. 05/03/14  Yes Historical Provider, MD  PROAIR HFA 108 (90 BASE) MCG/ACT inhaler Inhale 2 puffs into the lungs every 4 (four) hours as needed for wheezing. Shortness of breath or wheezing 05/03/14  Yes Historical Provider, MD   BP 184/85 mmHg  Pulse 70  Temp(Src) 98.2 F (36.8 C) (Oral)  Resp 21  Wt 54.976 kg  SpO2 99% Physical Exam  Constitutional: She is oriented to person, place, and time. She appears well-developed. No distress.  HENT:  Head: Normocephalic.  Right Ear: External ear normal.  Left Ear: External ear normal.  Mouth/Throat: Oropharynx is clear and moist.  3cm laceration to occiput, currently hemostatic  Eyes: Pupils are equal, round, and reactive to light. No scleral icterus.  Neck: No JVD present.  c-collar in place. No midline c-spine ttp  Cardiovascular: Normal rate,  regular rhythm, normal heart sounds and intact distal pulses.  Exam reveals no gallop and no friction rub.   No murmur heard. Pulmonary/Chest: Effort normal and breath sounds normal. No respiratory distress. She has no wheezes. She has no rales. She exhibits no tenderness.  Abdominal: Soft. She exhibits no distension. There is no tenderness. There is no rebound and no guarding.  Musculoskeletal: Normal range of motion. She exhibits no edema or tenderness.  Neurological: She is alert and oriented to person, place, and time. No cranial nerve deficit. Coordination normal.  Skin: Skin is warm and dry. No rash noted. She is not diaphoretic. No erythema.  Psychiatric: She has a normal mood and affect.  Nursing note and vitals reviewed.   ED Course  .Marland KitchenLaceration Repair Date/Time: 05/04/2015 11:59 AM Performed by: Marijean Niemann Authorized by: Linwood Dibbles Consent: Verbal consent obtained. Written consent not obtained. Risks and benefits: risks, benefits and alternatives were discussed Consent given by: patient Patient identity confirmed: verbally with patient Time out: Immediately prior to procedure a "time out" was called to verify the correct patient, procedure, equipment, support staff and site/side marked as required. Body area: head/neck Location details: scalp Wound length (cm): 2 lacerations. 1 that was 4cm. another 2cm. Foreign bodies: no foreign bodies Tendon involvement: none Nerve involvement: none Vascular damage: no Local anesthetic: lidocaine 1% with epinephrine Anesthetic total: 5 ml Patient sedated: no Preparation: Patient was prepped and draped in the usual sterile fashion. Irrigation solution: saline Irrigation method: syringe Amount of cleaning: standard Debridement: none Degree of undermining: minimal (through subcutaneous tissue) Subcutaneous closure: 5-0 Chromic gut (3) Number of sutures: 11 staples. Technique: simple Approximation: close Approximation difficulty:  simple Dressing: antibiotic ointment Patient tolerance: Patient tolerated the procedure well with no immediate complications   (including critical care time) Labs Review Labs Reviewed  CBC - Abnormal; Notable for the following:    WBC 11.3 (*)    Hemoglobin 11.8 (*)    HCT 34.1 (*)    All other components within normal limits  URINALYSIS, ROUTINE W REFLEX MICROSCOPIC (NOT AT [facility]) - Abnormal; Notable for the following:    APPearance CLOUDY (*)    Hgb urine dipstick MODERATE (*)    Leukocytes, UA LARGE (*)    All other components within normal limits  URINE MICROSCOPIC-ADD ON - Abnormal; Notable for the following:    Squamous Epithelial / LPF 6-30 (*)    Bacteria, UA FEW (*)    All other components within normal limits  I-STAT CHEM 8, ED - Abnormal; Notable for the following:    Sodium 132 (*)    Chloride 93 (*)    BUN 26 (*)    Creatinine, Ser 1.10 (*)    Glucose, Bld 113 (*)    All other components within normal limits  URINE CULTURE  CBC  TSH    Imaging Review Ct  Head Wo Contrast  05/04/2015  CLINICAL DATA:  Fall this morning with posterior head laceration. Initial encounter. EXAM: CT HEAD WITHOUT CONTRAST CT CERVICAL SPINE WITHOUT CONTRAST TECHNIQUE: Multidetector CT imaging of the head and cervical spine was performed following the standard protocol without intravenous contrast. Multiplanar CT image reconstructions of the cervical spine were also generated. COMPARISON:  None. FINDINGS: CT HEAD FINDINGS Skull and Sinuses:Posterior scalp swelling without fracture. Left mastoidectomy with extensive soft tissue density in the mastoid bowl, bulging the mineralized tympanic membrane internally. The scutum is absent and the ossicular chain is deficient. Without comparison, differentiating postsurgical and erosive bone loss is not possible. The sigmoid plate is thin but appears intact. Visualized orbits: Negative. Brain: No evidence of acute infarction, hemorrhage, hydrocephalus, or  mass lesion/mass effect. Generalized cerebral volume loss which is age congruent. Mild for age small-vessel ischemic change confluent in the deep cerebral white matter. CT CERVICAL SPINE FINDINGS Negative for acute fracture or subluxation. No prevertebral edema. No gross cervical canal hematoma. Severe and diffuse facet arthropathy with bulky spurring. The atlantodental interval is also markedly distorted and narrowed. Usual degenerative disc change. No erosive changes. IMPRESSION: 1. No evidence of acute intracranial or cervical spine injury. 2. Scalp laceration without fracture. 3. Left mastoidectomy. Extensive soft tissue in the bowl could be debris, scarring, or cholesteatoma. Electronically Signed   By: Marnee Spring M.D.   On: 05/04/2015 10:13   Ct Cervical Spine Wo Contrast  05/04/2015  CLINICAL DATA:  Fall this morning with posterior head laceration. Initial encounter. EXAM: CT HEAD WITHOUT CONTRAST CT CERVICAL SPINE WITHOUT CONTRAST TECHNIQUE: Multidetector CT imaging of the head and cervical spine was performed following the standard protocol without intravenous contrast. Multiplanar CT image reconstructions of the cervical spine were also generated. COMPARISON:  None. FINDINGS: CT HEAD FINDINGS Skull and Sinuses:Posterior scalp swelling without fracture. Left mastoidectomy with extensive soft tissue density in the mastoid bowl, bulging the mineralized tympanic membrane internally. The scutum is absent and the ossicular chain is deficient. Without comparison, differentiating postsurgical and erosive bone loss is not possible. The sigmoid plate is thin but appears intact. Visualized orbits: Negative. Brain: No evidence of acute infarction, hemorrhage, hydrocephalus, or mass lesion/mass effect. Generalized cerebral volume loss which is age congruent. Mild for age small-vessel ischemic change confluent in the deep cerebral white matter. CT CERVICAL SPINE FINDINGS Negative for acute fracture or  subluxation. No prevertebral edema. No gross cervical canal hematoma. Severe and diffuse facet arthropathy with bulky spurring. The atlantodental interval is also markedly distorted and narrowed. Usual degenerative disc change. No erosive changes. IMPRESSION: 1. No evidence of acute intracranial or cervical spine injury. 2. Scalp laceration without fracture. 3. Left mastoidectomy. Extensive soft tissue in the bowl could be debris, scarring, or cholesteatoma. Electronically Signed   By: Marnee Spring M.D.   On: 05/04/2015 10:13   I have personally reviewed and evaluated these images and lab results as part of my medical decision-making.   EKG Interpretation   Date/Time:  Friday May 04 2015 09:08:17 EST Ventricular Rate:  114 PR Interval:    QRS Duration: 271 QT Interval:    QTC Calculation:   R Axis:   -93 Text Interpretation:  A-V dual-paced complexes w/ some inhibition No  further analysis attempted due to paced rhythm No previous tracing  Confirmed by KNAPP  MD-J, JON ([id number]) on 05/04/2015 9:12:50 AM      MDM   Final diagnoses:  None    Patient is a 82 year old female  who presents after a unwitnessed fall at her assisted living facility today. She states that she remembers getting up to get prune juice but was found sitting against a wall in a pool of blood by staff with an obvious large laceration at the back of her head. Unclear if there were any presyncopal symptoms or pain at the time. Currently she complains of a mild headache but is at her baseline. Further history and exam as above notable for hypertension but otherwise reassuring step vital signs. Laceration to her head cleansed and repaired as above. CT head and C-spine negative and blood work reassuring. EKG with a  paced rhythm. Given history of unclear fall or syncope patient be admitted to medicine for further management and evaluation.    Marijean Niemann, MD 05/04/15 1653  Linwood Dibbles, MD 05/05/15 [phone]

## 2015-05-04 NOTE — H&P (Signed)
[facility] History and Physical                                                                                    Bethany Landry, is a 82 y.o. female  MRN: [id number]   DOB - 1920-07-12  Admit Date - 05/04/2015  Outpatient Primary MD for the patient is Shamleffer, Landry Mellow, MD  Referring MD: Lynelle Doctor / ER  PMH: Past Medical History  Diagnosis Date  . Osteoporosis   . Benign hypertension   . Hyperlipidemia   . Osteoarthritis   . Insomnia   . Hypothyroidism   . Complete heart block (HCC)   . Mastoiditis     hearing loss, s/p mastoiectomy, right ear  . DOE (dyspnea on exertion)   . CKD (chronic kidney disease), stage III     moderate  . Sinoatrial node dysfunction (HCC)   . Fungal infection     recurrent      PSH: Past Surgical History  Procedure Laterality Date  . Mastoidectomy Right     Tumor removal and residual recurrent fungal infection. Dr. Pollyann Kennedy  . Thyroidectomy    . Back surgery    . Laparotomy      Ex lap for abdominal pain (ischemic colitis)  . Insert / replace / remove pacemaker  11/26/09    [employer], DDD PM  . Cataract extraction       CC:  Chief Complaint  Patient presents with  . Fall     HPI: 82 yo female patient with underlying interstitial lung disease and chronic dyspnea on exertion, hypertension, history of complete heart block status post pacemaker and hypothyroidism as well as chronic kidney disease. Patient is a resident of [facility] and was brought to the ER by EMS after being found on the floor in her room seated in a pool of blood within noted laceration to her posterior scalp. The patient did not remember falling but last her members walking to get prune juice. Prior to this event she had not had any nausea or vomiting or palpitations. She denied that she had had a bowel movement or strained in a manner that would cause vasovagal symptoms. She does report occasional nocturnal dizziness when she gets up to go  the bathroom but has never fallen. She does report recent issues with constipation. She says several weeks ago she she had flulike symptoms and described these as significant chest and back pain with breathing in deep movement but this has resolved.  ER Evaluation and treatment: Hypertensive with a BP of 210/95, 100% AV sequential pacing with ventricular rates in the 60s, room air saturations 99% CT of the head and cervical spine: No acute intracranial or cervical spine injury, scalp laceration without fracture, history of prior left mastoidectomy with extensive soft tissue in the bowl. EKG: 100% AV sequential pacing Abnormal labs: Sodium 132 which is near baseline, BUN 26 and creatinine 1.10 which is also near baseline EDP repairing/approximating posterior scalp laceration  Review of Systems   In addition to the HPI above,  No Fever-chills, myalgias or other constitutional symptoms No Headache, changes with Vision or hearing, new weakness, tingling, numbness  in any extremity, No problems swallowing food or Liquids, indigestion/reflux No Chest pain, Cough or Shortness of Breath, palpitations, orthopnea or DOE No Abdominal pain, N/V; no melena or hematochezia, no dark tarry stools, Bowel movements are regular, No dysuria, hematuria or flank pain No new skin rashes, lesions, masses or bruises, No new joints pains-aches No recent weight gain or loss No polyuria, polydypsia or polyphagia,  *A full 10 point Review of Systems was done, except as stated above, all other Review of Systems were negative.  Social History Social History  Substance Use Topics  . Smoking status: Former Smoker    Types: Cigarettes    Quit date: 04/14/1982  . Smokeless tobacco: Not on file  . Alcohol Use: Yes     Comment: wine, occasionally    Resides at: [facility]  Lives with: N/A  Ambulatory status: Rolling walker   Family History Family History  Problem Relation Age of Onset  .  CAD Brother   . Cerebral aneurysm Brother   . Heart attack Mother      Prior to Admission medications   Medication Sig Start Date End Date Taking? Authorizing Provider  acetaminophen (TYLENOL) 325 MG tablet Take 650 mg by mouth every 6 (six) hours as needed for mild pain.   Yes Historical Provider, MD  aspirin EC 81 MG tablet Take 81 mg by mouth daily.   Yes Historical Provider, MD  hydrochlorothiazide (MICROZIDE) 12.5 MG capsule Take 1 capsule (12.5 mg total) by mouth daily. 06/16/14  Yes Lyn Records, MD  levothyroxine (SYNTHROID, LEVOTHROID) 125 MCG tablet TK 1 T PO QD 11/17/14  Yes Historical Provider, MD  metoprolol (LOPRESSOR) 50 MG tablet Take 1 tablet by mouth 2 (two) times daily. 05/03/14  Yes Historical Provider, MD  PROAIR HFA 108 (90 BASE) MCG/ACT inhaler Inhale 2 puffs into the lungs every 4 (four) hours as needed for wheezing. Shortness of breath or wheezing 05/03/14  Yes Historical Provider, MD    Allergies  Allergen Reactions  . Ace Inhibitors Swelling and Cough  . Norvasc [Amlodipine Besylate] Swelling    Swelling in ankles  . Quinapril Hcl Swelling    Physical Exam  Vitals  Blood pressure 211/90, pulse 71, resp. rate 17, weight 126 lb 15.8 oz (57.6 kg), SpO2 99 %.   General:  In no acute distress, appears younger than stated age  Psych:  Normal affect, Denies Suicidal or Homicidal ideations, Awake Alert, Oriented X 3. Speech and thought patterns are clear and appropriate, no apparent short term memory deficits  Neuro:   No focal neurological deficits, CN II through XII intact, Strength 5/5 all 4 extremities, Sensation intact all 4 extremities.  ENT:  Ears and Eyes appear Normal, Conjunctivae clear, PER. Moist oral mucosa without erythema or exudates.  Neck:  Supple, No lymphadenopathy appreciated  Respiratory:  Symmetrical chest wall movement, Good air movement bilaterally, CTAB. Room Air  Cardiac:  RRR, No Murmurs, no LE edema noted, no JVD, No carotid bruits,  peripheral pulses palpable at 2+  Abdomen:  Positive bowel sounds, Soft, Non tender, Non distended,  No masses appreciated, no obvious hepatosplenomegaly  Skin:  No Cyanosis, Normal Skin Turgor, No Skin Rash or Bruise. 2 large linear vertical deep lacerations in the posterior scalp forming a semi-V-pattern in process of being approximated by suturing per EDP  Extremities: Symmetrical without obvious trauma or injury,  no effusions.  Data Review  CBC  Recent Labs Lab 05/04/15 0902 05/04/15 0926  WBC 8.9  --  HGB 12.4 14.3  HCT 36.4 42.0  PLT 308  --   MCV 88.6  --   MCH 30.2  --   MCHC 34.1  --   RDW 13.3  --     Chemistries   Recent Labs Lab 05/04/15 0926  NA 132*  K 4.1  CL 93*  GLUCOSE 113*  BUN 26*  CREATININE 1.10*    CrCl cannot be calculated (Unknown ideal weight.).  No results for input(s): TSH, T4TOTAL, T3FREE, THYROIDAB in the last 72 hours.  Invalid input(s): FREET3  Coagulation profile No results for input(s): INR, PROTIME in the last 168 hours.  No results for input(s): DDIMER in the last 72 hours.  Cardiac Enzymes No results for input(s): CKMB, TROPONINI, MYOGLOBIN in the last 168 hours.  Invalid input(s): CK  Invalid input(s): POCBNP  Urinalysis    Component Value Date/Time   COLORURINE YELLOW 10/12/2013 2057   APPEARANCEUR CLEAR 10/12/2013 2057   LABSPEC 1.010 10/12/2013 2057   PHURINE 7.0 10/12/2013 2057   GLUCOSEU NEGATIVE 10/12/2013 2057   HGBUR NEGATIVE 10/12/2013 2057   BILIRUBINUR NEGATIVE 10/12/2013 2057   KETONESUR NEGATIVE 10/12/2013 2057   PROTEINUR NEGATIVE 10/12/2013 2057   UROBILINOGEN 0.2 10/12/2013 2057   NITRITE NEGATIVE 10/12/2013 2057   LEUKOCYTESUR SMALL* 10/12/2013 2057    Imaging results:   Ct Head Wo Contrast  05/04/2015  CLINICAL DATA:  Fall this morning with posterior head laceration. Initial encounter. EXAM: CT HEAD WITHOUT CONTRAST CT CERVICAL SPINE WITHOUT CONTRAST TECHNIQUE: Multidetector CT  imaging of the head and cervical spine was performed following the standard protocol without intravenous contrast. Multiplanar CT image reconstructions of the cervical spine were also generated. COMPARISON:  None. FINDINGS: CT HEAD FINDINGS Skull and Sinuses:Posterior scalp swelling without fracture. Left mastoidectomy with extensive soft tissue density in the mastoid bowl, bulging the mineralized tympanic membrane internally. The scutum is absent and the ossicular chain is deficient. Without comparison, differentiating postsurgical and erosive bone loss is not possible. The sigmoid plate is thin but appears intact. Visualized orbits: Negative. Brain: No evidence of acute infarction, hemorrhage, hydrocephalus, or mass lesion/mass effect. Generalized cerebral volume loss which is age congruent. Mild for age small-vessel ischemic change confluent in the deep cerebral white matter. CT CERVICAL SPINE FINDINGS Negative for acute fracture or subluxation. No prevertebral edema. No gross cervical canal hematoma. Severe and diffuse facet arthropathy with bulky spurring. The atlantodental interval is also markedly distorted and narrowed. Usual degenerative disc change. No erosive changes. IMPRESSION: 1. No evidence of acute intracranial or cervical spine injury. 2. Scalp laceration without fracture. 3. Left mastoidectomy. Extensive soft tissue in the bowl could be debris, scarring, or cholesteatoma. Electronically Signed   By: Marnee Spring M.D.   On: 05/04/2015 10:13   Ct Cervical Spine Wo Contrast  05/04/2015  CLINICAL DATA:  Fall this morning with posterior head laceration. Initial encounter. EXAM: CT HEAD WITHOUT CONTRAST CT CERVICAL SPINE WITHOUT CONTRAST TECHNIQUE: Multidetector CT imaging of the head and cervical spine was performed following the standard protocol without intravenous contrast. Multiplanar CT image reconstructions of the cervical spine were also generated. COMPARISON:  None. FINDINGS: CT HEAD  FINDINGS Skull and Sinuses:Posterior scalp swelling without fracture. Left mastoidectomy with extensive soft tissue density in the mastoid bowl, bulging the mineralized tympanic membrane internally. The scutum is absent and the ossicular chain is deficient. Without comparison, differentiating postsurgical and erosive bone loss is not possible. The sigmoid plate is thin but appears intact. Visualized orbits: Negative. Brain: No  evidence of acute infarction, hemorrhage, hydrocephalus, or mass lesion/mass effect. Generalized cerebral volume loss which is age congruent. Mild for age small-vessel ischemic change confluent in the deep cerebral white matter. CT CERVICAL SPINE FINDINGS Negative for acute fracture or subluxation. No prevertebral edema. No gross cervical canal hematoma. Severe and diffuse facet arthropathy with bulky spurring. The atlantodental interval is also markedly distorted and narrowed. Usual degenerative disc change. No erosive changes. IMPRESSION: 1. No evidence of acute intracranial or cervical spine injury. 2. Scalp laceration without fracture. 3. Left mastoidectomy. Extensive soft tissue in the bowl could be debris, scarring, or cholesteatoma. Electronically Signed   By: Marnee Spring M.D.   On: 05/04/2015 10:13     EKG: (Independently reviewed)  100% AV sequential pacing   Assessment & Plan  Principal Problem:   Unwitnessed Fall- ?Syncope -Obs/Tele -symptoms suspicious for syncope preceding fall -OVS; son available in room at time of attending physician's evaluation and confirm patient does have a history of orthostatic hypotension resulting therefore cardiologist has allowed for permissive hypertension -ECHO -Hold diuretics- if OVS pos then time limitedIVFs -Could be sign of occult infection so check urinalysis and culture and elderly female  Active Problems:   Occipital scalp laceration -Documented significant blood loss at facility -Repeat CBC at 2 PM and again in  a.m.    Essential hypertension/ Elevated systolic blood pressure -Holding thiazide diuretic as above -Permissive hypertension prevent orthostasis    Cardiac pacemaker -Cardiology notified of need to interrogate pacemaker in setting of unwitnessed fall and suspected syncope      Chronic hyponatremia -Secondary to use of thiazide diuretic    Interstitial lung disease  -Patient endorses chronic dyspnea on exertion    CKD, stage III -Renal function stable and at baseline    DVT Prophylaxis: Lovenox  Family Communication:   Son at bedside  Code Status:  Full code  Condition:  Stable  Discharge disposition: Anticipate discharge back to independent living once medically stable and syncope evaluation complete  Time spent in minutes : 60      ELLIS,ALLISON L. ANP on 05/04/2015 at 12:14 PM  You may contact me by going to [email or web address] - password [email or web address]  I am available from 7a-7p but please confirm I am on the schedule by going to Amion as above.   After 7p please contact night coverage person covering me after hours  [facility]

## 2015-05-04 NOTE — Progress Notes (Signed)
05/04/2015 1605 Received pt to 2W26 from ER.  Pt is A&O, no c/o pain.  Tele monitor applied and CCMD notified. Oriented to room, call light and bed.  Call bell in reach.  Bed alarm on. Bethany Landry

## 2015-05-04 NOTE — Care Management Note (Signed)
Case Management Note  Patient Details  Name: Bethany Landry MRN: [id number] Date of Birth: 12-03-1920  Subjective/Objective:                  Patient is a 82 y.o. female presenting with fall. //From [facility].  Action/Plan: Follow for disposition needs.   Expected Discharge Date:       05/05/15           Expected Discharge Plan:  Assisted Living / Rest Home  In-House Referral:  Financial Counselor  Discharge planning Services  CM Consult  Post Acute Care Choice:  NA Choice offered to:  NA  DME Arranged:  N/A DME Agency:  NA  HH Arranged:  NA HH Agency:  NA  Status of Service:  In process, will continue to follow  Medicare Important Message Given:    Date Medicare IM Given:    Medicare IM give by:    Date Additional Medicare IM Given:    Additional Medicare Important Message give by:     If discussed at Long Length of Stay Meetings, dates discussed:    Additional Comments: ERCM consulted to obtain estimation of costs for pt being admitted in observation.  ERCM reviewed chart, found that pt has dual coverage.  Advised that pt review insurance policy; it is likely that secondary insurance will cover what Medicare does not pay, but they need to review documents to be sure. Oletta Cohn, RN 05/04/2015, 1:35 PM

## 2015-05-04 NOTE — Consult Note (Signed)
Patient ID: Bethany Landry MRN: [id number], DOB/AGE: [birth year]   Admit date: 05/04/2015   Primary Physician: Tommy Rainwater, MD Primary Cardiologist: Dr. Katrinka Blazing  Pt. Profile:  82 y/o female with h/o CHB s/p St Jude PPM, HTN, orthostatic hypotension and hypothyroidism, presenting to the ED for Syncope resulting in a fall and scalp laceration.   Problem List  Past Medical History  Diagnosis Date  . Osteoporosis   . Benign hypertension   . Hyperlipidemia   . Osteoarthritis   . Insomnia   . Hypothyroidism   . Complete heart block (HCC)   . Mastoiditis     hearing loss, s/p mastoiectomy, right ear  . DOE (dyspnea on exertion)   . CKD (chronic kidney disease), stage III     moderate  . Sinoatrial node dysfunction (HCC)   . Fungal infection     recurrent    Past Surgical History  Procedure Laterality Date  . Mastoidectomy Right     Tumor removal and residual recurrent fungal infection. Dr. Pollyann Kennedy  . Thyroidectomy    . Back surgery    . Laparotomy      Ex lap for abdominal pain (ischemic colitis)  . Insert / replace / remove pacemaker  11/26/09    [employer]. Jude, DDD PM  . Cataract extraction       Allergies  Allergies  Allergen Reactions  . Ace Inhibitors Swelling and Cough  . Norvasc [Amlodipine Besylate] Swelling    Swelling in ankles  . Quinapril Hcl Swelling    HPI  82 y/o female, followed by Dr. Katrinka Blazing,  with h/o CHB s/p St Jude PPM followed by Dr. Johney Frame, HTN, orthostatic hypotension and hypothyroidism, presenting to the ED for syncope that resulted in a fall and scalp laceration.   The event happened earlier today at the ALF where she resides. She reports that she was in her usual state of health all day yesterday and when she went to bed. She does not recall much regarding the events that occurred earlier this morning. All that she can remember is that she got out of bed and was walking to get some prune juice and then suddenly fell. She denies any  presyncopal symptoms. No dizziness, chest pain, dyspnea, lightheadedness. She does not remember whether or not she completely lost consciousness. After the event she denies any symptoms. Loletha Grayer she suffered was a scalp laceration which has been repaired in the ED. The patient also denies any recent history of nausea, vomiting, diarrhea, hematochezia melanoma, fever, chills or dysuria. Your reports normal by mouth intake. She reports full medication compliance and denies any recent medication changes.  In the ED, CT of the head and neck are negative for acute intracranial bleed or fracture. Her pacemaker has been interrogated by [facility]. Jude's device rep and her device is functioning normally. There were no arrhythmias that correlated to the timing of her syncope earlier this morning. SCr is 1.10. BUN 26. UA pending. Sodium 132. CBC is negative for anemia. EKG shows A-V dual-paced complexes w/ some inhibition. HR is stable.  She has been hypertensive in the ED with SBP ranging in the 140s-200s.    Home Medications  Prior to Admission medications   Medication Sig Start Date End Date Taking? Authorizing Provider  acetaminophen (TYLENOL) 325 MG tablet Take 650 mg by mouth every 6 (six) hours as needed for mild pain.   Yes Historical Provider, MD  aspirin EC 81 MG tablet Take 81 mg by mouth daily.  Yes Historical Provider, MD  hydrochlorothiazide (MICROZIDE) 12.5 MG capsule Take 1 capsule (12.5 mg total) by mouth daily. 06/16/14  Yes Lyn Records, MD  levothyroxine (SYNTHROID, LEVOTHROID) 125 MCG tablet TK 1 T PO QD 11/17/14  Yes Historical Provider, MD  metoprolol (LOPRESSOR) 50 MG tablet Take 1 tablet by mouth 2 (two) times daily. 05/03/14  Yes Historical Provider, MD  PROAIR HFA 108 (90 BASE) MCG/ACT inhaler Inhale 2 puffs into the lungs every 4 (four) hours as needed for wheezing. Shortness of breath or wheezing 05/03/14  Yes Historical Provider, MD    Family History  Family History  Problem Relation Age  of Onset  . CAD Brother   . Cerebral aneurysm Brother   . Heart attack Mother     Social History  Social History   Social History  . Marital Status: Divorced    Spouse Name: N/A  . Number of Children: N/A  . Years of Education: N/A   Occupational History  . Not on file.   Social History Main Topics  . Smoking status: Former Smoker    Types: Cigarettes    Quit date: 04/14/1982  . Smokeless tobacco: Not on file  . Alcohol Use: Yes     Comment: wine, occasionally  . Drug Use: No  . Sexual Activity: Not on file   Other Topics Concern  . Not on file   Social History Narrative     Review of Systems General:  No chills, fever, night sweats or weight changes.  Cardiovascular:  No chest pain, dyspnea on exertion, edema, orthopnea, palpitations, paroxysmal nocturnal dyspnea.  + for syncope Dermatological: No rash, lesions/masses Respiratory: No cough, dyspnea Urologic: No hematuria, dysuria Abdominal:   No nausea, vomiting, diarrhea, bright red blood per rectum, melena, or hematemesis Neurologic:  No visual changes, wkns, changes in mental status. All other systems reviewed and are otherwise negative except as noted above.  Physical Exam  Blood pressure 154/94, pulse 67, resp. rate 25, weight 126 lb 15.8 oz (57.6 kg), SpO2 98 %.  General: Pleasant, NAD, frail Psych: Normal affect. Neuro: Alert and oriented X 3. Moves all extremities spontaneously. HEENT: + scalp laceration, now repaired  Neck: Supple without bruits or JVD. Lungs:  Resp regular and unlabored, CTA. Heart: RRR no s3, s4, or murmurs. Abdomen: Soft, non-tender, non-distended, BS + x 4.  Extremities: No clubbing, cyanosis or edema. DP/PT/Radials 2+ and equal bilaterally.  Labs  Troponin (Point of Care Test) No results for input(s): TROPIPOC in the last 72 hours. No results for input(s): CKTOTAL, CKMB, TROPONINI in the last 72 hours. Lab Results  Component Value Date   WBC 8.9 05/04/2015   HGB 14.3  05/04/2015   HCT 42.0 05/04/2015   MCV 88.6 05/04/2015   PLT 308 05/04/2015    Recent Labs Lab 05/04/15 0926  NA 132*  K 4.1  CL 93*  BUN 26*  CREATININE 1.10*  GLUCOSE 113*   No results found for: CHOL, HDL, LDLCALC, TRIG No results found for: DDIMER   Radiology/Studies  Ct Head Wo Contrast  05/04/2015  CLINICAL DATA:  Fall this morning with posterior head laceration. Initial encounter. EXAM: CT HEAD WITHOUT CONTRAST CT CERVICAL SPINE WITHOUT CONTRAST TECHNIQUE: Multidetector CT imaging of the head and cervical spine was performed following the standard protocol without intravenous contrast. Multiplanar CT image reconstructions of the cervical spine were also generated. COMPARISON:  None. FINDINGS: CT HEAD FINDINGS Skull and Sinuses:Posterior scalp swelling without fracture. Left mastoidectomy with extensive soft  tissue density in the mastoid bowl, bulging the mineralized tympanic membrane internally. The scutum is absent and the ossicular chain is deficient. Without comparison, differentiating postsurgical and erosive bone loss is not possible. The sigmoid plate is thin but appears intact. Visualized orbits: Negative. Brain: No evidence of acute infarction, hemorrhage, hydrocephalus, or mass lesion/mass effect. Generalized cerebral volume loss which is age congruent. Mild for age small-vessel ischemic change confluent in the deep cerebral white matter. CT CERVICAL SPINE FINDINGS Negative for acute fracture or subluxation. No prevertebral edema. No gross cervical canal hematoma. Severe and diffuse facet arthropathy with bulky spurring. The atlantodental interval is also markedly distorted and narrowed. Usual degenerative disc change. No erosive changes. IMPRESSION: 1. No evidence of acute intracranial or cervical spine injury. 2. Scalp laceration without fracture. 3. Left mastoidectomy. Extensive soft tissue in the bowl could be debris, scarring, or cholesteatoma. Electronically Signed   By:  Marnee Spring M.D.   On: 05/04/2015 10:13   Ct Cervical Spine Wo Contrast  05/04/2015  CLINICAL DATA:  Fall this morning with posterior head laceration. Initial encounter. EXAM: CT HEAD WITHOUT CONTRAST CT CERVICAL SPINE WITHOUT CONTRAST TECHNIQUE: Multidetector CT imaging of the head and cervical spine was performed following the standard protocol without intravenous contrast. Multiplanar CT image reconstructions of the cervical spine were also generated. COMPARISON:  None. FINDINGS: CT HEAD FINDINGS Skull and Sinuses:Posterior scalp swelling without fracture. Left mastoidectomy with extensive soft tissue density in the mastoid bowl, bulging the mineralized tympanic membrane internally. The scutum is absent and the ossicular chain is deficient. Without comparison, differentiating postsurgical and erosive bone loss is not possible. The sigmoid plate is thin but appears intact. Visualized orbits: Negative. Brain: No evidence of acute infarction, hemorrhage, hydrocephalus, or mass lesion/mass effect. Generalized cerebral volume loss which is age congruent. Mild for age small-vessel ischemic change confluent in the deep cerebral white matter. CT CERVICAL SPINE FINDINGS Negative for acute fracture or subluxation. No prevertebral edema. No gross cervical canal hematoma. Severe and diffuse facet arthropathy with bulky spurring. The atlantodental interval is also markedly distorted and narrowed. Usual degenerative disc change. No erosive changes. IMPRESSION: 1. No evidence of acute intracranial or cervical spine injury. 2. Scalp laceration without fracture. 3. Left mastoidectomy. Extensive soft tissue in the bowl could be debris, scarring, or cholesteatoma. Electronically Signed   By: Marnee Spring M.D.   On: 05/04/2015 10:13    ECG Pt's St. Jude's PPM has been interrogated in the ED. Interrogation revealed underlying complete heart block with a rate in the 30s however she is being paced and her pacemaker is  functioning normally. No arrhythmias.    ASSESSMENT AND PLAN  Principal Problem:   Syncope Active Problems:   Essential hypertension   Interstitial lung disease (HCC)   Cardiac pacemaker   Fall   Elevated systolic blood pressure   CKD (chronic kidney disease), stage III   Chronic hyponatremia   Occipital scalp laceration   1. Syncope: Patient denies any symptoms prior nor after incident. She denies chest pain, dyspnea, palpitations. She does have known complete heart block but has a permanent pacemaker. Her device was interrogated in the ED and shows normal functioning. No arrhythmias around the time of her episode. She does have a history of orthostatic hypotension. Agree with admission for observation. Check orthostatic vital signs. Agree with 2-D echo. Keep well hydrated. Also agree with checking a UA to r/o UTI.   2. HTN: Patient's blood pressure is running on the higher side today.  It was also high at time of her last office visit with Dr. Katrinka Blazing and he added 12.5 mg of hydrochlorothiazide. She is also on 50 mg of Metroprolol twice a day. She also has a history of orthostatic hypotension that has resulted in multiple falls. Will need to monitor OVS and BP closely in the next 24 hrs to see if she will need any adjustments with her meds.   Signed, Robbie Lis, PA-C 05/04/2015, 2:12 PM

## 2015-05-05 ENCOUNTER — Observation Stay (HOSPITAL_BASED_OUTPATIENT_CLINIC_OR_DEPARTMENT_OTHER): Payer: Medicare Other

## 2015-05-05 DIAGNOSIS — E871 Hypo-osmolality and hyponatremia: Secondary | ICD-10-CM | POA: Diagnosis not present

## 2015-05-05 DIAGNOSIS — N183 Chronic kidney disease, stage 3 (moderate): Secondary | ICD-10-CM

## 2015-05-05 DIAGNOSIS — W19XXXA Unspecified fall, initial encounter: Secondary | ICD-10-CM | POA: Diagnosis not present

## 2015-05-05 DIAGNOSIS — R55 Syncope and collapse: Secondary | ICD-10-CM

## 2015-05-05 LAB — CBC
HEMATOCRIT: 33 % — AB (ref 36.0–46.0)
Hemoglobin: 11.4 g/dL — ABNORMAL LOW (ref 12.0–15.0)
MCH: 30.3 pg (ref 26.0–34.0)
MCHC: 34.5 g/dL (ref 30.0–36.0)
MCV: 87.8 fL (ref 78.0–100.0)
Platelets: 284 10*3/uL (ref 150–400)
RBC: 3.76 MIL/uL — AB (ref 3.87–5.11)
RDW: 13.4 % (ref 11.5–15.5)
WBC: 10.4 10*3/uL (ref 4.0–10.5)

## 2015-05-05 LAB — GLUCOSE, CAPILLARY: Glucose-Capillary: 99 mg/dL (ref 65–99)

## 2015-05-05 NOTE — Discharge Summary (Addendum)
Physician Discharge Summary  Bethany Landry JXB:[id number] DOB: 21-Jun-1920 DOA: 05/04/2015  PCP: Tommy Rainwater, MD  Admit date: 05/04/2015 Discharge date: 05/05/2015  Time spent: 25 minutes  Recommendations for Outpatient Follow-up:  1. Needs blood work bemt/cbc 1 week 2. Reconsider dosing of metorpolol as OP-might benefit from lower dose?  HCTZ stopped this admit 3. Follow echo perfromed in hospital-son is a MD and states will have this followed closely as OP 4. Had asymptomatic bacteriuria-no need abx   Discharge Diagnoses:  Principal Problem:   Syncope Active Problems:   Essential hypertension   Interstitial lung disease (HCC)   Cardiac pacemaker   Fall   Elevated systolic blood pressure   CKD (chronic kidney disease), stage III   Chronic hyponatremia   Occipital scalp laceration   Discharge Condition: good  Diet recommendation:  reg  Filed Weights   05/04/15 0902 05/04/15 1641 05/05/15 0512  Weight: 57.6 kg (126 lb 15.8 oz) 54.976 kg (121 lb 3.2 oz) 53.797 kg (118 lb 9.6 oz)    History of present illness: 82 y/o ? CHB s/p DDD PPM @ [facility] DOE with interstitial Lung disease Prior smoker Chr hyponatremia CKD III  Admitted s/p fall and syncope, sustained a scalp laceration  evaluated by cardiology who felt this might be orthosstatic hypotension No suggestion Sz like activity  PPM interrogated and then reporgrammed to a higher rate No arrythmias on tele   Discharge Exam: Filed Vitals:   05/05/15 0411 05/05/15 1300  BP: 138/60   Pulse: 59   Temp: 97.7 F (36.5 C) 97.4 F (36.3 C)  Resp: 20    Feels fair  General: eomi ncat Cardiovascular: s1 s 2no m/r/g Respiratory: clear    Discharge Instructions   Discharge Instructions    Diet - low sodium heart healthy    Complete by:  As directed      Discharge instructions    Complete by:  As directed   Follow with dr Mendel Ryder as OP with results for Echo     Increase activity slowly     Complete by:  As directed           Current Discharge Medication List    CONTINUE these medications which have NOT CHANGED   Details  acetaminophen (TYLENOL) 325 MG tablet Take 650 mg by mouth every 6 (six) hours as needed for mild pain.    aspirin EC 81 MG tablet Take 81 mg by mouth daily.    levothyroxine (SYNTHROID, LEVOTHROID) 125 MCG tablet TK 1 T PO QD Refills: 6    metoprolol (LOPRESSOR) 50 MG tablet Take 1 tablet by mouth 2 (two) times daily. Refills: 3    PROAIR HFA 108 (90 BASE) MCG/ACT inhaler Inhale 2 puffs into the lungs every 4 (four) hours as needed for wheezing. Shortness of breath or wheezing Refills: 6      STOP taking these medications     hydrochlorothiazide (MICROZIDE) 12.5 MG capsule        Allergies  Allergen Reactions  . Ace Inhibitors Swelling and Cough  . Norvasc [Amlodipine Besylate] Swelling    Swelling in ankles  . Quinapril Hcl Swelling      The results of significant diagnostics from this hospitalization (including imaging, microbiology, ancillary and laboratory) are listed below for reference.    Significant Diagnostic Studies: Ct Head Wo Contrast  05/04/2015  CLINICAL DATA:  Fall this morning with posterior head laceration. Initial encounter. EXAM: CT HEAD WITHOUT CONTRAST CT CERVICAL SPINE WITHOUT CONTRAST  TECHNIQUE: Multidetector CT imaging of the head and cervical spine was performed following the standard protocol without intravenous contrast. Multiplanar CT image reconstructions of the cervical spine were also generated. COMPARISON:  None. FINDINGS: CT HEAD FINDINGS Skull and Sinuses:Posterior scalp swelling without fracture. Left mastoidectomy with extensive soft tissue density in the mastoid bowl, bulging the mineralized tympanic membrane internally. The scutum is absent and the ossicular chain is deficient. Without comparison, differentiating postsurgical and erosive bone loss is not possible. The sigmoid plate is thin but appears  intact. Visualized orbits: Negative. Brain: No evidence of acute infarction, hemorrhage, hydrocephalus, or mass lesion/mass effect. Generalized cerebral volume loss which is age congruent. Mild for age small-vessel ischemic change confluent in the deep cerebral white matter. CT CERVICAL SPINE FINDINGS Negative for acute fracture or subluxation. No prevertebral edema. No gross cervical canal hematoma. Severe and diffuse facet arthropathy with bulky spurring. The atlantodental interval is also markedly distorted and narrowed. Usual degenerative disc change. No erosive changes. IMPRESSION: 1. No evidence of acute intracranial or cervical spine injury. 2. Scalp laceration without fracture. 3. Left mastoidectomy. Extensive soft tissue in the bowl could be debris, scarring, or cholesteatoma. Electronically Signed   By: Marnee Spring M.D.   On: 05/04/2015 10:13   Ct Cervical Spine Wo Contrast  05/04/2015  CLINICAL DATA:  Fall this morning with posterior head laceration. Initial encounter. EXAM: CT HEAD WITHOUT CONTRAST CT CERVICAL SPINE WITHOUT CONTRAST TECHNIQUE: Multidetector CT imaging of the head and cervical spine was performed following the standard protocol without intravenous contrast. Multiplanar CT image reconstructions of the cervical spine were also generated. COMPARISON:  None. FINDINGS: CT HEAD FINDINGS Skull and Sinuses:Posterior scalp swelling without fracture. Left mastoidectomy with extensive soft tissue density in the mastoid bowl, bulging the mineralized tympanic membrane internally. The scutum is absent and the ossicular chain is deficient. Without comparison, differentiating postsurgical and erosive bone loss is not possible. The sigmoid plate is thin but appears intact. Visualized orbits: Negative. Brain: No evidence of acute infarction, hemorrhage, hydrocephalus, or mass lesion/mass effect. Generalized cerebral volume loss which is age congruent. Mild for age small-vessel ischemic change  confluent in the deep cerebral white matter. CT CERVICAL SPINE FINDINGS Negative for acute fracture or subluxation. No prevertebral edema. No gross cervical canal hematoma. Severe and diffuse facet arthropathy with bulky spurring. The atlantodental interval is also markedly distorted and narrowed. Usual degenerative disc change. No erosive changes. IMPRESSION: 1. No evidence of acute intracranial or cervical spine injury. 2. Scalp laceration without fracture. 3. Left mastoidectomy. Extensive soft tissue in the bowl could be debris, scarring, or cholesteatoma. Electronically Signed   By: Marnee Spring M.D.   On: 05/04/2015 10:13    Microbiology: Recent Results (from the past 240 hour(s))  Urine culture     Status: None (Preliminary result)   Collection Time: 05/04/15  2:21 PM  Result Value Ref Range Status   Specimen Description URINE, RANDOM  Final   Special Requests NONE  Final   Culture TOO YOUNG TO READ  Final   Report Status PENDING  Incomplete     Labs: Basic Metabolic Panel:  Recent Labs Lab 05/04/15 0926  NA 132*  K 4.1  CL 93*  GLUCOSE 113*  BUN 26*  CREATININE 1.10*   Liver Function Tests: No results for input(s): AST, ALT, ALKPHOS, BILITOT, PROT, ALBUMIN in the last 168 hours. No results for input(s): LIPASE, AMYLASE in the last 168 hours. No results for input(s): AMMONIA in the last 168 hours.  CBC:  Recent Labs Lab 05/04/15 0902 05/04/15 0926 05/04/15 1602 05/05/15 0305  WBC 8.9  --  11.3* 10.4  HGB 12.4 14.3 11.8* 11.4*  HCT 36.4 42.0 34.1* 33.0*  MCV 88.6  --  87.4 87.8  PLT 308  --  305 284   Cardiac Enzymes: No results for input(s): CKTOTAL, CKMB, CKMBINDEX, TROPONINI in the last 168 hours. BNP: BNP (last 3 results) No results for input(s): BNP in the last 8760 hours.  ProBNP (last 3 results) No results for input(s): PROBNP in the last 8760 hours.  CBG:  Recent Labs Lab 05/05/15 0619  GLUCAP 99       Signed:  Rhetta Mura MD    [facility] 05/05/2015, 3:12 PM

## 2015-05-05 NOTE — Progress Notes (Deleted)
Physician Discharge Summary  Bethany Landry UEA:[id number] DOB: [birth year] DOA: 05/04/2015  PCP: Tommy Rainwater, MD  Admit date: 05/04/2015 Discharge date: 05/05/2015  Time spent: 25 minutes  Recommendations for Outpatient Follow-up:  1. Needs blood work bemt/cbc 1 week 2. Reconsider dosing of metorpolol as OP-might benefit from lower dose?  HCTZ stopped this admit 3. Follow echo perfromed in hospital-son is a MD and states will have this follwe dclosely a sOP   Discharge Diagnoses:  Principal Problem:   Syncope Active Problems:   Essential hypertension   Interstitial lung disease (HCC)   Cardiac pacemaker   Fall   Elevated systolic blood pressure   CKD (chronic kidney disease), stage III   Chronic hyponatremia   Occipital scalp laceration   Discharge Condition: good  Diet recommendation:  reg  Filed Weights   05/04/15 0902 05/04/15 1641 05/05/15 0512  Weight: 57.6 kg (126 lb 15.8 oz) 54.976 kg (121 lb 3.2 oz) 53.797 kg (118 lb 9.6 oz)    History of present illness: 82 y/o ? CHB s/p DDD PPM @ [facility] DOE with interstitial Lung disease Prior smoker Chr hyponatremia CKD III  Admitted s/p fall and syncope, sustained a scalp laceration  evaluated by cardiology who felt this might be orthosstatic hypotension No suggestion Sz like activity  PPM interrogated and then reporgrammed to a higher rate No arrythmias on tele   Discharge Exam: Filed Vitals:   05/04/15 2135 05/05/15 0411  BP: 127/61 138/60  Pulse: 68 59  Temp: 97.6 F (36.4 C) 97.7 F (36.5 C)  Resp:  20   Feels fair  General: eomi ncat Cardiovascular: s1 s 2no m/r/g Respiratory: clear    Discharge Instructions   Discharge Instructions    Diet - low sodium heart healthy    Complete by:  As directed      Discharge instructions    Complete by:  As directed   Follow with dr Mendel Ryder as OP with results for Echo     Increase activity slowly    Complete by:  As directed            Current Discharge Medication List    CONTINUE these medications which have NOT CHANGED   Details  acetaminophen (TYLENOL) 325 MG tablet Take 650 mg by mouth every 6 (six) hours as needed for mild pain.    aspirin EC 81 MG tablet Take 81 mg by mouth daily.    levothyroxine (SYNTHROID, LEVOTHROID) 125 MCG tablet TK 1 T PO QD Refills: 6    metoprolol (LOPRESSOR) 50 MG tablet Take 1 tablet by mouth 2 (two) times daily. Refills: 3    PROAIR HFA 108 (90 BASE) MCG/ACT inhaler Inhale 2 puffs into the lungs every 4 (four) hours as needed for wheezing. Shortness of breath or wheezing Refills: 6      STOP taking these medications     hydrochlorothiazide (MICROZIDE) 12.5 MG capsule        Allergies  Allergen Reactions  . Ace Inhibitors Swelling and Cough  . Norvasc [Amlodipine Besylate] Swelling    Swelling in ankles  . Quinapril Hcl Swelling      The results of significant diagnostics from this hospitalization (including imaging, microbiology, ancillary and laboratory) are listed below for reference.    Significant Diagnostic Studies: Ct Head Wo Contrast  05/04/2015  CLINICAL DATA:  Fall this morning with posterior head laceration. Initial encounter. EXAM: CT HEAD WITHOUT CONTRAST CT CERVICAL SPINE WITHOUT CONTRAST TECHNIQUE: Multidetector CT imaging of the  head and cervical spine was performed following the standard protocol without intravenous contrast. Multiplanar CT image reconstructions of the cervical spine were also generated. COMPARISON:  None. FINDINGS: CT HEAD FINDINGS Skull and Sinuses:Posterior scalp swelling without fracture. Left mastoidectomy with extensive soft tissue density in the mastoid bowl, bulging the mineralized tympanic membrane internally. The scutum is absent and the ossicular chain is deficient. Without comparison, differentiating postsurgical and erosive bone loss is not possible. The sigmoid plate is thin but appears intact. Visualized orbits: Negative.  Brain: No evidence of acute infarction, hemorrhage, hydrocephalus, or mass lesion/mass effect. Generalized cerebral volume loss which is age congruent. Mild for age small-vessel ischemic change confluent in the deep cerebral white matter. CT CERVICAL SPINE FINDINGS Negative for acute fracture or subluxation. No prevertebral edema. No gross cervical canal hematoma. Severe and diffuse facet arthropathy with bulky spurring. The atlantodental interval is also markedly distorted and narrowed. Usual degenerative disc change. No erosive changes. IMPRESSION: 1. No evidence of acute intracranial or cervical spine injury. 2. Scalp laceration without fracture. 3. Left mastoidectomy. Extensive soft tissue in the bowl could be debris, scarring, or cholesteatoma. Electronically Signed   By: Marnee Spring M.D.   On: 05/04/2015 10:13   Ct Cervical Spine Wo Contrast  05/04/2015  CLINICAL DATA:  Fall this morning with posterior head laceration. Initial encounter. EXAM: CT HEAD WITHOUT CONTRAST CT CERVICAL SPINE WITHOUT CONTRAST TECHNIQUE: Multidetector CT imaging of the head and cervical spine was performed following the standard protocol without intravenous contrast. Multiplanar CT image reconstructions of the cervical spine were also generated. COMPARISON:  None. FINDINGS: CT HEAD FINDINGS Skull and Sinuses:Posterior scalp swelling without fracture. Left mastoidectomy with extensive soft tissue density in the mastoid bowl, bulging the mineralized tympanic membrane internally. The scutum is absent and the ossicular chain is deficient. Without comparison, differentiating postsurgical and erosive bone loss is not possible. The sigmoid plate is thin but appears intact. Visualized orbits: Negative. Brain: No evidence of acute infarction, hemorrhage, hydrocephalus, or mass lesion/mass effect. Generalized cerebral volume loss which is age congruent. Mild for age small-vessel ischemic change confluent in the deep cerebral white  matter. CT CERVICAL SPINE FINDINGS Negative for acute fracture or subluxation. No prevertebral edema. No gross cervical canal hematoma. Severe and diffuse facet arthropathy with bulky spurring. The atlantodental interval is also markedly distorted and narrowed. Usual degenerative disc change. No erosive changes. IMPRESSION: 1. No evidence of acute intracranial or cervical spine injury. 2. Scalp laceration without fracture. 3. Left mastoidectomy. Extensive soft tissue in the bowl could be debris, scarring, or cholesteatoma. Electronically Signed   By: Marnee Spring M.D.   On: 05/04/2015 10:13    Microbiology: Recent Results (from the past 240 hour(s))  Urine culture     Status: None (Preliminary result)   Collection Time: 05/04/15  2:21 PM  Result Value Ref Range Status   Specimen Description URINE, RANDOM  Final   Special Requests NONE  Final   Culture TOO YOUNG TO READ  Final   Report Status PENDING  Incomplete     Labs: Basic Metabolic Panel:  Recent Labs Lab 05/04/15 0926  NA 132*  K 4.1  CL 93*  GLUCOSE 113*  BUN 26*  CREATININE 1.10*   Liver Function Tests: No results for input(s): AST, ALT, ALKPHOS, BILITOT, PROT, ALBUMIN in the last 168 hours. No results for input(s): LIPASE, AMYLASE in the last 168 hours. No results for input(s): AMMONIA in the last 168 hours. CBC:  Recent Labs Lab 05/04/15  1610 05/04/15 0926 05/04/15 1602 05/05/15 0305  WBC 8.9  --  11.3* 10.4  HGB 12.4 14.3 11.8* 11.4*  HCT 36.4 42.0 34.1* 33.0*  MCV 88.6  --  87.4 87.8  PLT 308  --  305 284   Cardiac Enzymes: No results for input(s): CKTOTAL, CKMB, CKMBINDEX, TROPONINI in the last 168 hours. BNP: BNP (last 3 results) No results for input(s): BNP in the last 8760 hours.  ProBNP (last 3 results) No results for input(s): PROBNP in the last 8760 hours.  CBG:  Recent Labs Lab 05/05/15 0619  GLUCAP 99       Signed:  Rhetta Mura MD   [facility] 05/05/2015,  12:51 PM

## 2015-05-05 NOTE — Progress Notes (Signed)
Pt discharge education and instructions completed with pt and son at bedside; both voices understanding and denies any questions. Pt IV and telemetry removed; pt discharge home with son to transport her home. Pt transported off unit via wheelchair with son and belongings to the side. Dionne Bucy RN

## 2015-05-05 NOTE — Progress Notes (Signed)
  Echocardiogram 2D Echocardiogram has been performed.  Bethany Landry M 05/05/2015, 3:51 PM

## 2015-05-05 NOTE — Progress Notes (Signed)
PT Signoff Note   Patient Details Name: Bethany Landry MRN: [id number] DOB: 1920/05/12   Cancelled Treatment:    Reason Eval/Treat Not Completed: Other (comment) (pt left to go back to [place] before therapy arrived)  05/05/2015  Bonita Bing, PT [phone]  (pager)  Pratik Dalziel, Eliseo Gum 05/05/2015, 4:25 PM

## 2015-05-05 NOTE — Progress Notes (Signed)
Subjective:  No recurrence of syncope overnight.  She recalls fairly severe chest pain that lasted over 24 hours last weekend.  It was constant left-sided and was very mildly pleuritic.  She thought she might of had the flu but did not have any other flulike symptoms.  Objective:  Vital Signs in the last 24 hours: BP 138/60 mmHg  Pulse 59  Temp(Src) 97.7 F (36.5 C) (Oral)  Resp 20  Wt 53.797 kg (118 lb 9.6 oz)  SpO2 98%  Physical Exam: Elderly female in no acute distress  Lungs:  Clear Cardiac:  Regular rhythm, normal S1 and S2, no S3 Extremities:  No edema present  Intake/Output from previous day: 01/20 0701 - 01/21 0700 In: 240 [P.O.:240] Out: 250 [Urine:250]  Weight Filed Weights   05/04/15 0902 05/04/15 1641 05/05/15 0512  Weight: 57.6 kg (126 lb 15.8 oz) 54.976 kg (121 lb 3.2 oz) 53.797 kg (118 lb 9.6 oz)    Lab Results: Basic Metabolic Panel:  Recent Labs  16/10/96 0926  NA 132*  K 4.1  CL 93*  GLUCOSE 113*  BUN 26*  CREATININE 1.10*   CBC:  Recent Labs  05/04/15 1602 05/05/15 0305  WBC 11.3* 10.4  HGB 11.8* 11.4*  HCT 34.1* 33.0*  MCV 87.4 87.8  PLT 305 284   Telemetry: AV paced rhythm at 60  Assessment/Plan:  1.  Syncopal episode may have been orthostatic 2.  Prolonged chest pain last weekend-await results of echo to be sure was not an infarction 3.  Permanent pacemaker relatively slow baseline rate  Recommendations:  I like to give her a faster baseline rate to help her with compensation when she stands up.  We'll ask pacemaker rep to reprogram a rate of 70.   W. Ashley Royalty  MD [facility]  05/05/2015, 11:18 AM

## 2015-05-06 LAB — URINE CULTURE

## 2015-05-07 ENCOUNTER — Encounter: Payer: Medicare Other | Admitting: *Deleted

## 2015-05-09 ENCOUNTER — Encounter: Payer: Self-pay | Admitting: Cardiology

## 2015-05-16 ENCOUNTER — Encounter: Payer: Self-pay | Admitting: *Deleted

## 2015-05-31 ENCOUNTER — Ambulatory Visit: Payer: Medicare Other | Admitting: Podiatry

## 2015-06-04 ENCOUNTER — Telehealth: Payer: Self-pay | Admitting: Internal Medicine

## 2015-06-04 NOTE — Telephone Encounter (Signed)
Her machine that she uses to check her device is not working at home

## 2015-06-04 NOTE — Telephone Encounter (Signed)
Spoke w/ pt and she informed me that she is going to get her son to come over and hook up her home monitor and she knows that he can call the office he needs help.

## 2015-06-21 ENCOUNTER — Ambulatory Visit (INDEPENDENT_AMBULATORY_CARE_PROVIDER_SITE_OTHER): Payer: Medicare Other | Admitting: Podiatry

## 2015-06-21 ENCOUNTER — Encounter: Payer: Self-pay | Admitting: Podiatry

## 2015-06-21 DIAGNOSIS — B351 Tinea unguium: Secondary | ICD-10-CM

## 2015-06-21 DIAGNOSIS — M79676 Pain in unspecified toe(s): Secondary | ICD-10-CM | POA: Diagnosis not present

## 2015-06-21 NOTE — Progress Notes (Signed)
Patient ID: Bethany Landry, female   DOB: 14-Mar-1921, 82 y.o.   MRN: [id number] Complaint:  Visit Type: Patient returns to my office for continued preventative foot care services. Complaint: Patient states" my nails have grown long and thick and become painful to walk and wear shoes". The patient presents for preventative foot care services. No changes to ROS  Podiatric Exam: Vascular: dorsalis pedis and posterior tibial pulses are palpable bilateral. Capillary return is immediate. Temperature gradient is WNL. Skin turgor WNL  Sensorium: Normal Semmes Weinstein monofilament test. Normal tactile sensation bilaterally. Nail Exam: Pt has thick disfigured discolored nails with subungual debris noted bilateral entire nail hallux  Ulcer Exam: There is no evidence of ulcer or pre-ulcerative changes or infection. Orthopedic Exam: Muscle tone and strength are WNL. No limitations in general ROM. No crepitus or effusions noted. Foot type and digits show no abnormalities. Bony prominences are unremarkable. Skin: No Porokeratosis. No infection or ulcers  Diagnosis:  Onychomycosis, , Pain in right toe, pain in left toes  Treatment & Plan Procedures and Treatment: Consent by patient was obtained for treatment procedures. The patient understood the discussion of treatment and procedures well. All questions were answered thoroughly reviewed. Debridement of mycotic and hypertrophic toenails, 1 through 5 bilateral and clearing of subungual debris. No ulceration, no infection noted.  Return Visit-Office Procedure: Patient instructed to return to the office for a follow up visit 3 months for continued evaluation and treatment.    Helane GuntherGregory Esmond Hinch DPM

## 2015-06-28 ENCOUNTER — Encounter: Payer: Medicare Other | Admitting: Nurse Practitioner

## 2015-08-10 ENCOUNTER — Ambulatory Visit (INDEPENDENT_AMBULATORY_CARE_PROVIDER_SITE_OTHER): Payer: Medicare Other | Admitting: Interventional Cardiology

## 2015-08-10 ENCOUNTER — Encounter: Payer: Self-pay | Admitting: Interventional Cardiology

## 2015-08-10 ENCOUNTER — Ambulatory Visit (HOSPITAL_COMMUNITY)
Admission: RE | Admit: 2015-08-10 | Discharge: 2015-08-10 | Disposition: A | Discharge status: Home / Self Care | Payer: Medicare Other | Source: Ambulatory Visit | Attending: Interventional Cardiology | Admitting: Interventional Cardiology

## 2015-08-10 VITALS — BP 120/70 | HR 70 | Ht 64.0 in | Wt 126.0 lb

## 2015-08-10 DIAGNOSIS — M25552 Pain in left hip: Secondary | ICD-10-CM | POA: Diagnosis present

## 2015-08-10 DIAGNOSIS — I10 Essential (primary) hypertension: Secondary | ICD-10-CM

## 2015-08-10 DIAGNOSIS — Z950 Presence of cardiac pacemaker: Secondary | ICD-10-CM | POA: Diagnosis not present

## 2015-08-10 DIAGNOSIS — I442 Atrioventricular block, complete: Secondary | ICD-10-CM

## 2015-08-10 NOTE — Patient Instructions (Addendum)
Medication Instructions:  Your physician recommends that you continue on your current medications as directed. Please refer to the Current Medication list given to you today.  Labwork: None ordered  Testing/Procedures: Your physician has requested that you have a hip x-ray.  Follow-Up: Your physician wants you to follow-up in: 1 year with Dr. [name]. You will receive a reminder letter in the mail two months in advance. If you don't receive a letter, please call our office to schedule the follow-up appointment.  If you need a refill on your cardiac medications before your next appointment, please call your pharmacy.  Thank you for choosing [facility]!!

## 2015-08-10 NOTE — Progress Notes (Signed)
Cardiology Office Note   Date:  08/10/2015   ID:  Bethany Landry, DOB August 08, 1920, MRN [id number]  PCP:  Tommy RainwaterShamleffer, Ibethal JARALLA, MD  Cardiologist:  Lesleigh NoeSMITH III,Mili Piltz W, MD   Chief Complaint  Patient presents with  . Pacemaker Problem      History of Present Illness: Bethany Landry is a 82 y.o. female who presents for Follow-up of hypertension and sick sinus syndrome with permanent pacemaker.  Bethany Landry is accompanied by her son, anesthesiologist Carlyle BasquesGregg Corbo. She is doing relatively well but did have a significant fall in January. There was injury. She was observed overnight. An echocardiogram demonstrated LVEF 45%. Mild LVH was noted. Paradoxical septal motion related to pacing  No recent pacemaker follow-up since October 2016. Has had complaint of left hip pain since her fall in January. Her son is concerned about this.  Does have shortness of breath with activity more than previous.    Past Medical History  Diagnosis Date  . Osteoporosis   . Benign hypertension   . Hyperlipidemia   . Osteoarthritis   . Insomnia   . Hypothyroidism   . Complete heart block (HCC)   . Mastoiditis     hearing loss, s/p mastoiectomy, right ear  . DOE (dyspnea on exertion)   . CKD (chronic kidney disease), stage III     moderate  . Sinoatrial node dysfunction (HCC)   . Fungal infection     recurrent    Past Surgical History  Procedure Laterality Date  . Mastoidectomy Right     Tumor removal and residual recurrent fungal infection. Dr. Pollyann Kennedyosen  . Thyroidectomy    . Back surgery    . Laparotomy      Ex lap for abdominal pain (ischemic colitis)  . Insert / replace / remove pacemaker  11/26/09    [employer] Jude, DDD PM  . Cataract extraction       Current Outpatient Prescriptions  Medication Sig Dispense Refill  . acetaminophen (TYLENOL) 325 MG tablet Take 650 mg by mouth every 6 (six) hours as needed for mild pain.    Marland Kitchen. aspirin EC 81 MG tablet Take 81 mg by mouth daily.    .  hydrochlorothiazide (MICROZIDE) 12.5 MG capsule TK 1 CAPSULE PO DAILY  11  . levothyroxine (SYNTHROID, LEVOTHROID) 125 MCG tablet TK 1 T PO QD  6  . metoprolol (LOPRESSOR) 50 MG tablet Take 1 tablet by mouth 2 (two) times daily.  3   No current facility-administered medications for this visit.    Allergies:   Ace inhibitors; Norvasc; and Quinapril hcl    Social History:  The patient  reports that she quit smoking about 33 years ago. Her smoking use included Cigarettes. She does not have any smokeless tobacco history on file. She reports that she drinks alcohol. She reports that she does not use illicit drugs.   Family History:  The patient's family history includes CAD in her brother; Cerebral aneurysm in her brother; Heart attack in her mother.    ROS:  Please see the history of present illness.   Otherwise, review of systems are positive for left hip discomfort and concern about whether pacemaker checks of being done as frequently as they should. Her home telemetry devices not working..   All other systems are reviewed and negative.    PHYSICAL EXAM: VS:  BP 120/70 mmHg  Pulse 70  Ht 5\' 4"  (1.626 m)  Wt 126 lb (57.153 kg)  BMI 21.62 kg/m2 , BMI Body  mass index is 21.62 kg/(m^2). GEN: Well nourished, well developed, in no acute distress HEENT: normal Neck: no JVD, carotid bruits, or masses Cardiac: RRR.  There is no murmur, rub, or gallop. There is no edema. Respiratory:  clear to auscultation bilaterally, normal work of breathing. GI: soft, nontender, nondistended, + BS MS: no deformity or atrophy Skin: warm and dry, no rash Neuro:  Strength and sensation are intact Psych: euthymic mood, full affect   EKG:  EKG is not ordered today. The ekg reveals atrial tracking with ventricular pacing 08/04/2015.   Recent Labs: 05/04/2015: BUN 26*; Creatinine, Ser 1.10*; Potassium 4.1; Sodium 132*; TSH 6.622* 05/05/2015: Hemoglobin 11.4*; Platelets 284    Lipid Panel No results found  for: CHOL, TRIG, HDL, CHOLHDL, VLDL, LDLCALC, LDLDIRECT    Wt Readings from Last 3 Encounters:  08/10/15 126 lb (57.153 kg)  05/05/15 118 lb 9.6 oz (53.797 kg)  06/16/14 129 lb (58.514 kg)      Other studies Reviewed: Additional studies/ records that were reviewed today include: Reviewed echocardiogram report. The findings include echo from 05/05/2015. Study Conclusions  - Left ventricle: The cavity size was normal. Systolic function was  mildly reduced. The estimated ejection fraction was in the range  of 45% to 50%. Mild diffuse hypokinesis with no identifiable  regional variations. - Ventricular septum: Septal motion showed paradox. These changes  are consistent with right ventricular pacing. - Aortic valve: There was mild to moderate regurgitation directed  centrally in the LVOT.  Also an eye stat 7 in January when she fell revealed a normal potassium and near normal kidney function and her noted above.  ASSESSMENT AND PLAN:  1. Complete heart block (HCC) Currently AV sequentially paced with normal function when last evaluated in January.  2. Essential hypertension Well controlled at 1:30 overnight millimeters mercury today.  3. Cardiac pacemaker Establish in pacemaker clinic for follow-up  4. Left Hip pain Evaluate for any evidence of mechanical injury.  Current medicines are reviewed at length with the patient today.  The patient has the following concerns regarding medicines: .  The following changes/actions have been instituted:    Left hip x-ray  Pacemaker follow-up  Labs/ tests ordered today include:  No orders of the defined types were placed in this encounter.     Disposition:   FU with HS in 1 year  Signed, Lesleigh Noe, MD  08/10/2015 12:03 PM    [facility] [address] [place], [place], [place]  [address] Phone: [phone]; Fax: [phone]

## 2015-09-06 NOTE — Progress Notes (Signed)
Electrophysiology Office Note Date: 09/07/2015  ID:  Bethany Landry, DOB 1920-10-13, MRN [id number]  PCP: Tommy Rainwater, MD Primary Cardiologist: Katrinka Blazing Electrophysiologist: Allred  CC: Pacemaker follow-up  Bethany Landry is a 82 y.o. female seen today for Dr Johney Frame.  She presents today for routine electrophysiology followup.  Since last being seen in our clinic, the patient reports doing reasonably well. She had a fall in January with an overnight stay in the hospital at that time.  She has not had falls since. She denies chest pain, palpitations, dyspnea, PND, orthopnea, nausea, vomiting, dizziness, syncope, edema, weight gain, or early satiety.  Device History: STJ dual chamber PPM implanted 2011 for complete heart block    Past Medical History  Diagnosis Date  . Osteoporosis   . Benign hypertension   . Hyperlipidemia   . Osteoarthritis   . Insomnia   . Hypothyroidism   . Complete heart block (HCC)   . Mastoiditis     hearing loss, s/p mastoiectomy, right ear  . DOE (dyspnea on exertion)   . CKD (chronic kidney disease), stage III     moderate  . Sinoatrial node dysfunction (HCC)   . Fungal infection     recurrent   Past Surgical History  Procedure Laterality Date  . Mastoidectomy Right     Tumor removal and residual recurrent fungal infection. Dr. Pollyann Kennedy  . Thyroidectomy    . Back surgery    . Laparotomy      Ex lap for abdominal pain (ischemic colitis)  . Insert / replace / remove pacemaker  11/26/09    St. Jude, DDD PM  . Cataract extraction      Current Outpatient Prescriptions  Medication Sig Dispense Refill  . acetaminophen (TYLENOL) 325 MG tablet Take 650 mg by mouth every 6 (six) hours as needed for mild pain.    Marland Kitchen aspirin EC 81 MG tablet Take 81 mg by mouth daily.    . hydrochlorothiazide (MICROZIDE) 12.5 MG capsule TK 1 CAPSULE PO DAILY  11  . levothyroxine (SYNTHROID, LEVOTHROID) 125 MCG tablet TK 1 T PO QD  6  . metoprolol (LOPRESSOR)  50 MG tablet Take 1 tablet by mouth 2 (two) times daily.  3   No current facility-administered medications for this visit.    Allergies:   Ace inhibitors; Norvasc; and Quinapril hcl   Social History: Social History   Social History  . Marital Status: Divorced    Spouse Name: N/A  . Number of Children: N/A  . Years of Education: N/A   Occupational History  . Not on file.   Social History Main Topics  . Smoking status: Former Smoker    Types: Cigarettes    Quit date: 04/14/1982  . Smokeless tobacco: Not on file  . Alcohol Use: Yes     Comment: wine, occasionally  . Drug Use: No  . Sexual Activity: Not on file   Other Topics Concern  . Not on file   Social History Narrative    Family History: Family History  Problem Relation Age of Onset  . CAD Brother   . Cerebral aneurysm Brother   . Heart attack Mother      Review of Systems: All other systems reviewed and are otherwise negative except as noted above.   Physical Exam: VS:  BP 146/78 mmHg  Pulse 77  Ht  (1.6 m)  Wt 124 lb (56.246 kg)  BMI 21.97 kg/m2 , BMI Body mass index is 21.97 kg/(m^2).  GEN- The patient is elderly and frail appearing, alert and oriented x 3 today.   HEENT: normocephalic, atraumatic; sclera clear, conjunctiva pink; hearing intact; oropharynx clear; neck supple  Lungs- Clear to ausculation bilaterally, normal work of breathing.  No wheezes, rales, rhonchi Heart- Regular rate and rhythm (paced) GI- soft, non-tender, non-distended, bowel sounds present  Extremities- no clubbing, cyanosis, or edema; DP/PT/radial pulses 2+ bilaterally MS- no significant deformity or atrophy Skin- warm and dry, no rash or lesion; PPM pocket well healed Psych- euthymic mood, full affect Neuro- strength and sensation are intact  PPM Interrogation- reviewed in detail today,  See PACEART report  EKG:  EKG is not ordered today.  Recent Labs: 05/04/2015: BUN 26*; Creatinine, Ser 1.10*; Potassium 4.1;  Sodium 132*; TSH 6.622* 05/05/2015: Hemoglobin 11.4*; Platelets 284   Wt Readings from Last 3 Encounters:  09/07/15 124 lb (56.246 kg)  08/10/15 126 lb (57.153 kg)  05/05/15 118 lb 9.6 oz (53.797 kg)     Other studies Reviewed: Additional studies/ records that were reviewed today include:Dr Allred and Dr Michaelle CopasSmith's office notes  Assessment and Plan:  1.  Complete heart block  Normal PPM function Known noise on 2088 RA lead. With advanced age, will follow for now See Arita Missace Art report No changes today  2.  HTN Stable No change required today  The patient and her son would prefer a conservative management strategy with advanced age which I think is very reasonable. Will not do remote monitoring anymore and will see in office in 1 year.  They will call with needs between now and then.    Current medicines are reviewed at length with the patient today.   The patient does not have concerns regarding her medicines.  The following changes were made today:  none  Labs/ tests ordered today include: none  No orders of the defined types were placed in this encounter.     Disposition:  Dr [name] as scheduled, me in 1 year    Signed, Gypsy Balsammber Seiler, NP 09/07/2015 11:39 AM  [facility] [address] [place] [address] [phone] (office) [phone] (fax

## 2015-09-07 ENCOUNTER — Ambulatory Visit (INDEPENDENT_AMBULATORY_CARE_PROVIDER_SITE_OTHER): Payer: Medicare Other | Admitting: Nurse Practitioner

## 2015-09-07 ENCOUNTER — Encounter: Payer: Self-pay | Admitting: Internal Medicine

## 2015-09-07 ENCOUNTER — Encounter: Payer: Self-pay | Admitting: Nurse Practitioner

## 2015-09-07 VITALS — BP 146/78 | HR 77 | Ht 63.0 in | Wt 124.0 lb

## 2015-09-07 DIAGNOSIS — I442 Atrioventricular block, complete: Secondary | ICD-10-CM

## 2015-09-07 DIAGNOSIS — I10 Essential (primary) hypertension: Secondary | ICD-10-CM | POA: Diagnosis not present

## 2015-09-07 NOTE — Patient Instructions (Signed)
Medication Instructions:   Your physician recommends that you continue on your current medications as directed. Please refer to the Current Medication list given to you today.   If you need a refill on your cardiac medications before your next appointment, please call your pharmacy.   NO LAB WORK TODAY    Testing/Procedures: NONE ORDER TODAY    Follow-Up: Your physician wants you to follow-up in: ONE YEAR WITH SEILER You will receive a reminder letter in the mail two months in advance. If you don't receive a letter, please call our office to schedule the follow-up appointment.     Any Other Special Instructions Will Be Listed Below (If Applicable).

## 2015-09-08 LAB — CUP PACEART INCLINIC DEVICE CHECK
Implantable Lead Implant Date: 20110815
Implantable Lead Location: 753860
MDC IDC LEAD IMPLANT DT: 20110815
MDC IDC LEAD LOCATION: 753859
MDC IDC LEAD MODEL: 1948
MDC IDC SESS DTM: 20170527041123
Pulse Gen Model: 2210
Pulse Gen Serial Number: 7154389

## 2015-10-21 ENCOUNTER — Inpatient Hospital Stay (HOSPITAL_COMMUNITY)
Admission: EM | Admit: 2015-10-21 | Discharge: 2015-10-26 | DRG: 291 | Disposition: A | Discharge status: Skilled Nursing Facility | Payer: Medicare Other | Attending: Internal Medicine | Admitting: Internal Medicine

## 2015-10-21 ENCOUNTER — Encounter (HOSPITAL_COMMUNITY): Payer: Self-pay | Admitting: *Deleted

## 2015-10-21 ENCOUNTER — Emergency Department (HOSPITAL_COMMUNITY): Payer: Medicare Other

## 2015-10-21 DIAGNOSIS — Z87891 Personal history of nicotine dependence: Secondary | ICD-10-CM

## 2015-10-21 DIAGNOSIS — Z66 Do not resuscitate: Secondary | ICD-10-CM | POA: Diagnosis present

## 2015-10-21 DIAGNOSIS — E871 Hypo-osmolality and hyponatremia: Secondary | ICD-10-CM | POA: Diagnosis present

## 2015-10-21 DIAGNOSIS — R0602 Shortness of breath: Secondary | ICD-10-CM | POA: Diagnosis not present

## 2015-10-21 DIAGNOSIS — I429 Cardiomyopathy, unspecified: Secondary | ICD-10-CM | POA: Diagnosis present

## 2015-10-21 DIAGNOSIS — I130 Hypertensive heart and chronic kidney disease with heart failure and stage 1 through stage 4 chronic kidney disease, or unspecified chronic kidney disease: Principal | ICD-10-CM | POA: Diagnosis present

## 2015-10-21 DIAGNOSIS — I442 Atrioventricular block, complete: Secondary | ICD-10-CM | POA: Diagnosis present

## 2015-10-21 DIAGNOSIS — I5022 Chronic systolic (congestive) heart failure: Secondary | ICD-10-CM | POA: Diagnosis present

## 2015-10-21 DIAGNOSIS — I248 Other forms of acute ischemic heart disease: Secondary | ICD-10-CM | POA: Diagnosis present

## 2015-10-21 DIAGNOSIS — I509 Heart failure, unspecified: Secondary | ICD-10-CM | POA: Diagnosis not present

## 2015-10-21 DIAGNOSIS — N183 Chronic kidney disease, stage 3 unspecified: Secondary | ICD-10-CM | POA: Diagnosis present

## 2015-10-21 DIAGNOSIS — I10 Essential (primary) hypertension: Secondary | ICD-10-CM | POA: Diagnosis not present

## 2015-10-21 DIAGNOSIS — E785 Hyperlipidemia, unspecified: Secondary | ICD-10-CM | POA: Diagnosis present

## 2015-10-21 DIAGNOSIS — Z79899 Other long term (current) drug therapy: Secondary | ICD-10-CM

## 2015-10-21 DIAGNOSIS — H9191 Unspecified hearing loss, right ear: Secondary | ICD-10-CM | POA: Diagnosis present

## 2015-10-21 DIAGNOSIS — E43 Unspecified severe protein-calorie malnutrition: Secondary | ICD-10-CM | POA: Diagnosis present

## 2015-10-21 DIAGNOSIS — E039 Hypothyroidism, unspecified: Secondary | ICD-10-CM | POA: Diagnosis present

## 2015-10-21 DIAGNOSIS — Z8249 Family history of ischemic heart disease and other diseases of the circulatory system: Secondary | ICD-10-CM

## 2015-10-21 DIAGNOSIS — I495 Sick sinus syndrome: Secondary | ICD-10-CM | POA: Diagnosis present

## 2015-10-21 DIAGNOSIS — I5043 Acute on chronic combined systolic (congestive) and diastolic (congestive) heart failure: Secondary | ICD-10-CM | POA: Diagnosis present

## 2015-10-21 DIAGNOSIS — Z888 Allergy status to other drugs, medicaments and biological substances status: Secondary | ICD-10-CM

## 2015-10-21 DIAGNOSIS — M810 Age-related osteoporosis without current pathological fracture: Secondary | ICD-10-CM | POA: Diagnosis present

## 2015-10-21 DIAGNOSIS — Z681 Body mass index (BMI) 19 or less, adult: Secondary | ICD-10-CM

## 2015-10-21 DIAGNOSIS — E876 Hypokalemia: Secondary | ICD-10-CM | POA: Diagnosis present

## 2015-10-21 DIAGNOSIS — Z950 Presence of cardiac pacemaker: Secondary | ICD-10-CM | POA: Diagnosis present

## 2015-10-21 LAB — URINALYSIS, ROUTINE W REFLEX MICROSCOPIC
BILIRUBIN URINE: NEGATIVE
Glucose, UA: NEGATIVE mg/dL
KETONES UR: NEGATIVE mg/dL
Leukocytes, UA: NEGATIVE
NITRITE: NEGATIVE
PROTEIN: 30 mg/dL — AB
Specific Gravity, Urine: 1.013 (ref 1.005–1.030)
pH: 6 (ref 5.0–8.0)

## 2015-10-21 LAB — CBC WITH DIFFERENTIAL/PLATELET
Basophils Absolute: 0 10*3/uL (ref 0.0–0.1)
Basophils Relative: 0 %
EOS ABS: 0.1 10*3/uL (ref 0.0–0.7)
Eosinophils Relative: 1 %
HCT: 34.1 % — ABNORMAL LOW (ref 36.0–46.0)
Hemoglobin: 11.8 g/dL — ABNORMAL LOW (ref 12.0–15.0)
Lymphocytes Relative: 6 %
Lymphs Abs: 0.7 10*3/uL (ref 0.7–4.0)
MCH: 30.1 pg (ref 26.0–34.0)
MCHC: 34.6 g/dL (ref 30.0–36.0)
MCV: 87 fL (ref 78.0–100.0)
Monocytes Absolute: 0.8 10*3/uL (ref 0.1–1.0)
Monocytes Relative: 6 %
NEUTROS PCT: 87 %
Neutro Abs: 10.4 10*3/uL — ABNORMAL HIGH (ref 1.7–7.7)
PLATELETS: 251 10*3/uL (ref 150–400)
RBC: 3.92 MIL/uL (ref 3.87–5.11)
RDW: 13.9 % (ref 11.5–15.5)
WBC: 11.9 10*3/uL — AB (ref 4.0–10.5)

## 2015-10-21 LAB — URINE MICROSCOPIC-ADD ON

## 2015-10-21 LAB — BASIC METABOLIC PANEL
Anion gap: 12 (ref 5–15)
BUN: 40 mg/dL — ABNORMAL HIGH (ref 6–20)
CALCIUM: 8.9 mg/dL (ref 8.9–10.3)
CHLORIDE: 95 mmol/L — AB (ref 101–111)
CO2: 21 mmol/L — ABNORMAL LOW (ref 22–32)
CREATININE: 1.39 mg/dL — AB (ref 0.44–1.00)
GFR calc non Af Amer: 32 mL/min — ABNORMAL LOW (ref >60)
GFR, EST AFRICAN AMERICAN: 37 mL/min — AB (ref >60)
Glucose, Bld: 101 mg/dL — ABNORMAL HIGH (ref 65–99)
Potassium: 3.7 mmol/L (ref 3.5–5.1)
SODIUM: 128 mmol/L — AB (ref 135–145)

## 2015-10-21 LAB — BRAIN NATRIURETIC PEPTIDE: B Natriuretic Peptide: 2973 pg/mL — ABNORMAL HIGH (ref 0.0–100.0)

## 2015-10-21 LAB — MRSA PCR SCREENING: MRSA by PCR: NEGATIVE

## 2015-10-21 LAB — TROPONIN I
TROPONIN I: 0.04 ng/mL — AB (ref <0.03)
TROPONIN I: 0.05 ng/mL — AB (ref <0.03)
Troponin I: 0.04 ng/mL (ref <0.03)

## 2015-10-21 MED ORDER — SODIUM CHLORIDE 0.9% FLUSH
3.0000 mL | Freq: Two times a day (BID) | INTRAVENOUS | Status: DC
  Administered 2015-10-21 – 2015-10-26 (×10): 3 mL via INTRAVENOUS

## 2015-10-21 MED ORDER — LEVOTHYROXINE SODIUM 25 MCG PO TABS
125.0000 ug | ORAL_TABLET | Freq: Every day | ORAL | Status: DC
  Administered 2015-10-22 – 2015-10-26 (×5): 125 ug via ORAL
  Filled 2015-10-21 (×5): qty 1

## 2015-10-21 MED ORDER — ACETAMINOPHEN 325 MG PO TABS
650.0000 mg | ORAL_TABLET | ORAL | Status: DC | PRN
  Filled 2015-10-21: qty 2

## 2015-10-21 MED ORDER — FUROSEMIDE 10 MG/ML IJ SOLN
60.0000 mg | Freq: Every day | INTRAMUSCULAR | Status: DC
  Administered 2015-10-21 – 2015-10-22 (×2): 60 mg via INTRAVENOUS
  Filled 2015-10-21 (×2): qty 6

## 2015-10-21 MED ORDER — SODIUM CHLORIDE 0.9% FLUSH: 3.0000 mL | INTRAVENOUS | Status: DC | PRN

## 2015-10-21 MED ORDER — SODIUM CHLORIDE 0.9 % IV SOLN: 250.0000 mL | INTRAVENOUS | Status: DC | PRN

## 2015-10-21 MED ORDER — ONDANSETRON HCL 4 MG/2ML IJ SOLN: 4.0000 mg | Freq: Four times a day (QID) | INTRAMUSCULAR | Status: DC | PRN

## 2015-10-21 MED ORDER — HEPARIN SODIUM (PORCINE) 5000 UNIT/ML IJ SOLN
5000.0000 [IU] | Freq: Three times a day (TID) | INTRAMUSCULAR | Status: DC
  Administered 2015-10-21 – 2015-10-26 (×12): 5000 [IU] via SUBCUTANEOUS
  Filled 2015-10-21 (×13): qty 1

## 2015-10-21 NOTE — H&P (Addendum)
History and Physical    Daviana Haymaker ZOX:[id number] DOB: 01-26-1921 DOA: 10/21/2015  PCP: Clelia Schaumann, MD  Chief Complaint: Shortness of breath and dyspnea on exertion  HPI: Bethany Landry is a 82 y.o. female with medical history significant of hypothyroidism, CK D stage III, essential hypertension, history of third-degree heart block status post pacemaker, history of CHF in the past. Who presents to the hospital complaining of several days of shortness of breath and dyspnea on exertion. The problem has been gradual in onset and has been progressively getting worse. Nothing she is aware of makes it better. Activity makes his shortness of breath worse. Persistent symptoms patient decided to come to the hospital for further evaluation and recommendations.  ED Course: In the ED patient was found to have an elevated BNP of almost 3000, elevated serum creatinine of 1.39, and sodium of 128. Chest x-ray was obtained which reported findings felt to be secondary to congestive heart failure  Review of Systems: As per HPI otherwise 10 point review of systems negative.    Past Medical History  Diagnosis Date  . Osteoporosis   . Benign hypertension   . Hyperlipidemia   . Osteoarthritis   . Insomnia   . Hypothyroidism   . Complete heart block (HCC)   . Mastoiditis     hearing loss, s/p mastoiectomy, right ear  . DOE (dyspnea on exertion)   . CKD (chronic kidney disease), stage III     moderate  . Sinoatrial node dysfunction (HCC)   . Fungal infection     recurrent    Past Surgical History  Procedure Laterality Date  . Mastoidectomy Right     Tumor removal and residual recurrent fungal infection. Dr. Pollyann Kennedy  . Thyroidectomy    . Back surgery    . Laparotomy      Ex lap for abdominal pain (ischemic colitis)  . Insert / replace / remove pacemaker  11/26/09    St. Jude, DDD PM  . Cataract extraction       reports that she quit smoking about 33 years ago. Her smoking use included  Cigarettes. She does not have any smokeless tobacco history on file. She reports that she drinks alcohol. She reports that she does not use illicit drugs.  Allergies  Allergen Reactions  . Norvasc [Amlodipine Besylate] Swelling    Swelling in ankles  . Quinapril Hcl Swelling  . Ace Inhibitors Swelling and Cough    Family History  Problem Relation Age of Onset  . CAD Brother   . Cerebral aneurysm Brother   . Heart attack Mother     Prior to Admission medications   Medication Sig Start Date End Date Taking? Authorizing Provider  hydrochlorothiazide (MICROZIDE) 12.5 MG capsule TAKE 1 CAPSULE BY MOUTH ONCE DAILY 05/31/15  Yes Historical Provider, MD  levothyroxine (SYNTHROID, LEVOTHROID) 125 MCG tablet Take 1 TABLET BY MOUTH ONCE DAILY 11/17/14  Yes Historical Provider, MD  metoprolol (LOPRESSOR) 50 MG tablet Take 1 tablet by mouth 2 (two) times daily. 05/03/14  Yes Historical Provider, MD    Physical Exam: Filed Vitals:   10/21/15 1500 10/21/15 1545 10/21/15 1600 10/21/15 1658  BP: 164/99 168/107 166/98 160/90  Pulse: 78 76 67 74  Temp:      TempSrc:      Resp: 19 31 29 29   SpO2: 93% 93% 92% 93%      Constitutional: NAD, calm, comfortable Filed Vitals:   10/21/15 1500 10/21/15 1545 10/21/15 1600 10/21/15 1658  BP: 164/99  168/107 166/98 160/90  Pulse: 78 76 67 74  Temp:      TempSrc:      Resp: 19 31 29 29   SpO2: 93% 93% 92% 93%   Eyes: PERRL, lids and conjunctivae normal ENMT: Mucous membranes are moist. No erythema noted minute exam Neck: normal, supple, no masses, no thyromegaly Respiratory: Decreased breath sounds at bases, no wheezes, equal chest rise, poor inspiratory effort  Cardiovascular: Regular rate and rhythm, no rubs, no murmurs, no gallops, positive lower extremity edema Abdomen: no tenderness, no masses palpated. No hepatosplenomegaly. Bowel sounds positive.  Musculoskeletal: no clubbing / cyanosis. No joint deformity upper and lower extremities. Good ROM,  no contractures. Normal muscle tone.  Skin: no rashes, lesions, ulcers. No induration Neurologic: Patient answers questions appropriately, no facial asymmetry, moves extremities equally Psychiatric: Normal mood and affect.   Labs on Admission: I have personally reviewed following labs and imaging studies  CBC:  Recent Labs Lab 10/21/15 1450  WBC 11.9*  NEUTROABS 10.4*  HGB 11.8*  HCT 34.1*  MCV 87.0  PLT 251   Basic Metabolic Panel:  Recent Labs Lab 10/21/15 1450  NA 128*  K 3.7  CL 95*  CO2 21*  GLUCOSE 101*  BUN 40*  CREATININE 1.39*  CALCIUM 8.9   GFR: CrCl cannot be calculated (Unknown ideal weight.). Liver Function Tests: No results for input(s): AST, ALT, ALKPHOS, BILITOT, PROT, ALBUMIN in the last 168 hours. No results for input(s): LIPASE, AMYLASE in the last 168 hours. No results for input(s): AMMONIA in the last 168 hours. Coagulation Profile: No results for input(s): INR, PROTIME in the last 168 hours. Cardiac Enzymes:  Recent Labs Lab 10/21/15 1450  TROPONINI 0.04*   BNP (last 3 results) No results for input(s): PROBNP in the last 8760 hours. HbA1C: No results for input(s): HGBA1C in the last 72 hours. CBG: No results for input(s): GLUCAP in the last 168 hours. Lipid Profile: No results for input(s): CHOL, HDL, LDLCALC, TRIG, CHOLHDL, LDLDIRECT in the last 72 hours. Thyroid Function Tests: No results for input(s): TSH, T4TOTAL, FREET4, T3FREE, THYROIDAB in the last 72 hours. Anemia Panel: No results for input(s): VITAMINB12, FOLATE, FERRITIN, TIBC, IRON, RETICCTPCT in the last 72 hours. Urine analysis:    Component Value Date/Time   COLORURINE YELLOW 05/04/2015 1421   APPEARANCEUR CLOUDY* 05/04/2015 1421   LABSPEC 1.011 05/04/2015 1421   PHURINE 7.5 05/04/2015 1421   GLUCOSEU NEGATIVE 05/04/2015 1421   HGBUR MODERATE* 05/04/2015 1421   BILIRUBINUR NEGATIVE 05/04/2015 1421   KETONESUR NEGATIVE 05/04/2015 1421   PROTEINUR NEGATIVE  05/04/2015 1421   UROBILINOGEN 0.2 10/12/2013 2057   NITRITE NEGATIVE 05/04/2015 1421   LEUKOCYTESUR LARGE* 05/04/2015 1421   Sepsis Labs: !!!!!!!!!!!!!!!!!!!!!!!!!!!!!!!!!!!!!!!!!!!! @LABRCNTIP (procalcitonin:4,lacticidven:4) )No results found for this or any previous visit (from the past 240 hour(s)).   Radiological Exams on Admission: Dg Chest 2 View  10/21/2015  CLINICAL DATA:  Shortness of breath and cough. EXAM: CHEST  2 VIEW COMPARISON:  May 09, 2013 FINDINGS: There is mild bibasilar interstitial edema with bilateral pleural effusions. There is atelectatic change in the left base. There is cardiomegaly. The pulmonary vascularity is within normal limits. Pacemaker leads are attached to the right atrium and right ventricle. There is postoperative change in the lower thoracic and lumbar spine regions. IMPRESSION: Findings felt to represent a degree of congestive heart failure. Left base atelectasis present. Pacemaker leads attached to the right atrium and right ventricle. Electronically Signed   By: Bretta BangWilliam  Woodruff III M.D.  On: 10/21/2015 14:48    EKG: Independently reviewed. Paced rhythm  Assessment/Plan Active Problems:   Acute exacerbation of CHF (congestive heart failure) (HCC) - Monitor on telemetry -Obtain echocardiogram -Obtain troponins every 6 hours 3 sets   Essential hypertension - Will monitor off B blocker  CKD - stable will continue to monitor S creatinine  Hypothyroidism - Will continue current synthroid regimen   DVT prophylaxis: heparin Code Status:DNR Family Communication: d/c son Disposition Plan: telemetry Consults called: none Admission status: obs   Penny Pia MD [facility] Pager [phone]  If 7PM-7AM, please contact night-coverage [email or web address] Password [email or web address]  10/21/2015, 5:35 PM   Addendum Hypothyroidism - chronic problem will continue to monitor

## 2015-10-21 NOTE — ED Notes (Signed)
Pt has been having increasing SOB for the past two days. Pt bib son [place]  Pt reports a cough with clear mucus but family reports pt has been having a increasingly nonproductive cough.  Pt denies any pain or any other symptoms. Hx COPD, pace maker.  Lungs sounds clear bilaterally in triage.

## 2015-10-21 NOTE — ED Notes (Signed)
Patient transported to X-ray 

## 2015-10-21 NOTE — ED Notes (Signed)
Dr Rubin PayorPickering notified of pt Troponin

## 2015-10-21 NOTE — ED Provider Notes (Signed)
CSN: [id number]     Arrival date & time 10/21/15  1327 History   First MD Initiated Contact with Patient 10/21/15 1423     Chief Complaint  Patient presents with  . Shortness of Breath      Patient is a 82 y.o. female presenting with shortness of breath. The history is provided by the patient.  Shortness of Breath Associated symptoms: cough   Associated symptoms: no abdominal pain, no chest pain, no headaches, no rash and no vomiting   Patient presents with shortness of breath for the last 2-3 days. Said a little cough and chest tightness. History of dyspnea on exertion and has a pacemaker. Minimal production with the cough. No abdominal pain. No dysuria. Slight swelling or legs. Patient son states she has been drinking less.  Past Medical History  Diagnosis Date  . Osteoporosis   . Benign hypertension   . Hyperlipidemia   . Osteoarthritis   . Insomnia   . Hypothyroidism   . Complete heart block (HCC)   . Mastoiditis     hearing loss, s/p mastoiectomy, right ear  . DOE (dyspnea on exertion)   . CKD (chronic kidney disease), stage III     moderate  . Sinoatrial node dysfunction (HCC)   . Fungal infection     recurrent   Past Surgical History  Procedure Laterality Date  . Mastoidectomy Right     Tumor removal and residual recurrent fungal infection. Dr. Pollyann Kennedy  . Thyroidectomy    . Back surgery    . Laparotomy      Ex lap for abdominal pain (ischemic colitis)  . Insert / replace / remove pacemaker  11/26/09    St. Jude, DDD PM  . Cataract extraction     Family History  Problem Relation Age of Onset  . CAD Brother   . Cerebral aneurysm Brother   . Heart attack Mother    Social History  Substance Use Topics  . Smoking status: Former Smoker    Types: Cigarettes    Quit date: 04/14/1982  . Smokeless tobacco: None  . Alcohol Use: Yes     Comment: wine, occasionally   OB History    No data available     Review of Systems  Constitutional: Positive for appetite  change and fatigue. Negative for activity change.  Eyes: Negative for pain.  Respiratory: Positive for cough, chest tightness and shortness of breath.   Cardiovascular: Negative for chest pain and leg swelling.  Gastrointestinal: Negative for nausea, vomiting, abdominal pain and diarrhea.  Genitourinary: Negative for flank pain.  Musculoskeletal: Negative for back pain and neck stiffness.  Skin: Negative for rash.  Neurological: Negative for weakness, numbness and headaches.  Psychiatric/Behavioral: Negative for behavioral problems.      Allergies  Norvasc; Quinapril hcl; and Ace inhibitors  Home Medications   Prior to Admission medications   Medication Sig Start Date End Date Taking? Authorizing Provider  hydrochlorothiazide (MICROZIDE) 12.5 MG capsule TAKE 1 CAPSULE BY MOUTH ONCE DAILY 05/31/15  Yes Historical Provider, MD  levothyroxine (SYNTHROID, LEVOTHROID) 125 MCG tablet Take 1 TABLET BY MOUTH ONCE DAILY 11/17/14  Yes Historical Provider, MD  metoprolol (LOPRESSOR) 50 MG tablet Take 1 tablet by mouth 2 (two) times daily. 05/03/14  Yes Historical Provider, MD   BP 140/62 mmHg  Pulse 76  Temp(Src) 97.9 F (36.6 C) (Axillary)  Resp 22  Ht  (1.702 m)  Wt 115 lb 8 oz (52.39 kg)  BMI 18.09 kg/m2  SpO2  93% Physical Exam  Constitutional: She appears well-developed.  HENT:  Head: Atraumatic.  Eyes: EOM are normal.  Neck: No JVD present. Thyromegaly present.  Cardiovascular: Normal rate.   Pulmonary/Chest: No respiratory distress. She has no wheezes.  Slight harsh breath sounds on right side.  Abdominal: Soft.  Musculoskeletal: She exhibits edema.  Neurological: She is alert.  Skin: Skin is warm.    ED Course  Procedures (including critical care time) Labs Review Labs Reviewed  BASIC METABOLIC PANEL - Abnormal; Notable for the following:    Sodium 128 (*)    Chloride 95 (*)    CO2 21 (*)    Glucose, Bld 101 (*)    BUN 40 (*)    Creatinine, Ser 1.39 (*)    GFR  calc non Af Amer 32 (*)    GFR calc Af Amer 37 (*)    All other components within normal limits  URINALYSIS, ROUTINE W REFLEX MICROSCOPIC (NOT AT [facility]) - Abnormal; Notable for the following:    Hgb urine dipstick TRACE (*)    Protein, ur 30 (*)    All other components within normal limits  CBC WITH DIFFERENTIAL/PLATELET - Abnormal; Notable for the following:    WBC 11.9 (*)    Hemoglobin 11.8 (*)    HCT 34.1 (*)    Neutro Abs 10.4 (*)    All other components within normal limits  TROPONIN I - Abnormal; Notable for the following:    Troponin I 0.04 (*)    All other components within normal limits  BRAIN NATRIURETIC PEPTIDE - Abnormal; Notable for the following:    B Natriuretic Peptide 2973.0 (*)    All other components within normal limits  TROPONIN I - Abnormal; Notable for the following:    Troponin I 0.04 (*)    All other components within normal limits  TROPONIN I - Abnormal; Notable for the following:    Troponin I 0.05 (*)    All other components within normal limits  URINE MICROSCOPIC-ADD ON - Abnormal; Notable for the following:    Squamous Epithelial / LPF 0-5 (*)    Bacteria, UA RARE (*)    Casts HYALINE CASTS (*)    All other components within normal limits  MRSA PCR SCREENING  TROPONIN I  BASIC METABOLIC PANEL    Imaging Review Dg Chest 2 View  10/21/2015  CLINICAL DATA:  Shortness of breath and cough. EXAM: CHEST  2 VIEW COMPARISON:  May 09, 2013 FINDINGS: There is mild bibasilar interstitial edema with bilateral pleural effusions. There is atelectatic change in the left base. There is cardiomegaly. The pulmonary vascularity is within normal limits. Pacemaker leads are attached to the right atrium and right ventricle. There is postoperative change in the lower thoracic and lumbar spine regions. IMPRESSION: Findings felt to represent a degree of congestive heart failure. Left base atelectasis present. Pacemaker leads attached to the right atrium and right  ventricle. Electronically Signed   By: Bretta Bang III M.D.   On: 10/21/2015 14:48   I have personally reviewed and evaluated these images and lab results as part of my medical decision-making.   EKG Interpretation   Date/Time:  Sunday October 21 2015 13:37:34 EDT Ventricular Rate:  83 PR Interval:    QRS Duration: 177 QT Interval:  437 QTC Calculation: 495 R Axis:   -74 Text Interpretation:  A-V dual-paced complexes w/ some inhibition No  further analysis attempted due to paced rhythm Confirmed by Rubin Payor  MD,  Harrold Donath (  [id number]) on 10/21/2015 2:23:21 PM      MDM   Final diagnoses:  Congestive heart failure, unspecified congestive heart failure chronicity, unspecified congestive heart failure type [facility](HCC)    Patient with CHF. Mild production with cough. BNP is somewhat elevated. Troponin minimally elevated. Will admit to internal medicine.    Benjiman CoreNathan Marquell Saenz, MD 10/22/15 [phone]

## 2015-10-22 ENCOUNTER — Observation Stay (HOSPITAL_BASED_OUTPATIENT_CLINIC_OR_DEPARTMENT_OTHER): Payer: Medicare Other

## 2015-10-22 DIAGNOSIS — E039 Hypothyroidism, unspecified: Secondary | ICD-10-CM | POA: Diagnosis present

## 2015-10-22 DIAGNOSIS — I442 Atrioventricular block, complete: Secondary | ICD-10-CM | POA: Diagnosis not present

## 2015-10-22 DIAGNOSIS — M810 Age-related osteoporosis without current pathological fracture: Secondary | ICD-10-CM | POA: Diagnosis present

## 2015-10-22 DIAGNOSIS — I509 Heart failure, unspecified: Secondary | ICD-10-CM

## 2015-10-22 DIAGNOSIS — I5043 Acute on chronic combined systolic (congestive) and diastolic (congestive) heart failure: Secondary | ICD-10-CM | POA: Diagnosis not present

## 2015-10-22 DIAGNOSIS — I495 Sick sinus syndrome: Secondary | ICD-10-CM | POA: Diagnosis present

## 2015-10-22 DIAGNOSIS — I5021 Acute systolic (congestive) heart failure: Secondary | ICD-10-CM | POA: Diagnosis not present

## 2015-10-22 DIAGNOSIS — I130 Hypertensive heart and chronic kidney disease with heart failure and stage 1 through stage 4 chronic kidney disease, or unspecified chronic kidney disease: Secondary | ICD-10-CM | POA: Diagnosis present

## 2015-10-22 DIAGNOSIS — E876 Hypokalemia: Secondary | ICD-10-CM | POA: Diagnosis not present

## 2015-10-22 DIAGNOSIS — Z87891 Personal history of nicotine dependence: Secondary | ICD-10-CM | POA: Diagnosis not present

## 2015-10-22 DIAGNOSIS — R0602 Shortness of breath: Secondary | ICD-10-CM | POA: Diagnosis present

## 2015-10-22 DIAGNOSIS — Z8249 Family history of ischemic heart disease and other diseases of the circulatory system: Secondary | ICD-10-CM | POA: Diagnosis not present

## 2015-10-22 DIAGNOSIS — I10 Essential (primary) hypertension: Secondary | ICD-10-CM | POA: Diagnosis not present

## 2015-10-22 DIAGNOSIS — Z681 Body mass index (BMI) 19 or less, adult: Secondary | ICD-10-CM | POA: Diagnosis not present

## 2015-10-22 DIAGNOSIS — I248 Other forms of acute ischemic heart disease: Secondary | ICD-10-CM | POA: Diagnosis present

## 2015-10-22 DIAGNOSIS — Z950 Presence of cardiac pacemaker: Secondary | ICD-10-CM | POA: Diagnosis not present

## 2015-10-22 DIAGNOSIS — Z66 Do not resuscitate: Secondary | ICD-10-CM | POA: Diagnosis present

## 2015-10-22 DIAGNOSIS — I429 Cardiomyopathy, unspecified: Secondary | ICD-10-CM | POA: Diagnosis present

## 2015-10-22 DIAGNOSIS — E871 Hypo-osmolality and hyponatremia: Secondary | ICD-10-CM | POA: Diagnosis not present

## 2015-10-22 DIAGNOSIS — I5023 Acute on chronic systolic (congestive) heart failure: Secondary | ICD-10-CM | POA: Diagnosis not present

## 2015-10-22 DIAGNOSIS — Z79899 Other long term (current) drug therapy: Secondary | ICD-10-CM | POA: Diagnosis not present

## 2015-10-22 DIAGNOSIS — E785 Hyperlipidemia, unspecified: Secondary | ICD-10-CM | POA: Diagnosis present

## 2015-10-22 DIAGNOSIS — H9191 Unspecified hearing loss, right ear: Secondary | ICD-10-CM | POA: Diagnosis present

## 2015-10-22 DIAGNOSIS — E43 Unspecified severe protein-calorie malnutrition: Secondary | ICD-10-CM | POA: Diagnosis not present

## 2015-10-22 DIAGNOSIS — N183 Chronic kidney disease, stage 3 (moderate): Secondary | ICD-10-CM | POA: Diagnosis not present

## 2015-10-22 DIAGNOSIS — Z888 Allergy status to other drugs, medicaments and biological substances status: Secondary | ICD-10-CM | POA: Diagnosis not present

## 2015-10-22 LAB — ECHOCARDIOGRAM COMPLETE
Height: 67 in
Weight: 1830.4 oz

## 2015-10-22 LAB — BASIC METABOLIC PANEL
ANION GAP: 11 (ref 5–15)
BUN: 31 mg/dL — AB (ref 6–20)
CO2: 30 mmol/L (ref 22–32)
Calcium: 8.7 mg/dL — ABNORMAL LOW (ref 8.9–10.3)
Chloride: 90 mmol/L — ABNORMAL LOW (ref 101–111)
Creatinine, Ser: 1.21 mg/dL — ABNORMAL HIGH (ref 0.44–1.00)
GFR calc Af Amer: 43 mL/min — ABNORMAL LOW (ref >60)
GFR calc non Af Amer: 37 mL/min — ABNORMAL LOW (ref >60)
GLUCOSE: 106 mg/dL — AB (ref 65–99)
POTASSIUM: 2.7 mmol/L — AB (ref 3.5–5.1)
Sodium: 131 mmol/L — ABNORMAL LOW (ref 135–145)

## 2015-10-22 LAB — TROPONIN I: TROPONIN I: 0.05 ng/mL — AB (ref <0.03)

## 2015-10-22 MED ORDER — FUROSEMIDE 10 MG/ML IJ SOLN: 40.0000 mg | Freq: Every day | INTRAMUSCULAR | Status: DC

## 2015-10-22 MED ORDER — POTASSIUM CHLORIDE 10 MEQ/100ML IV SOLN
10.0000 meq | INTRAVENOUS | Status: AC
  Administered 2015-10-22 (×2): 10 meq via INTRAVENOUS
  Filled 2015-10-22 (×2): qty 100

## 2015-10-22 MED ORDER — POTASSIUM CHLORIDE CRYS ER 20 MEQ PO TBCR
40.0000 meq | EXTENDED_RELEASE_TABLET | Freq: Once | ORAL | Status: AC
  Administered 2015-10-22: 40 meq via ORAL
  Filled 2015-10-22: qty 2

## 2015-10-22 NOTE — Progress Notes (Signed)
PROGRESS NOTE    Bethany Landry  EXB:[id number] DOB: Jul 25, 1920 DOA: 10/21/2015 PCP: Clelia Schaumann, MD   Brief Narrative:  Bethany Landry is a 82 y.o. female with medical history significant of hypothyroidism, CK D stage III, essential hypertension, history of third-degree heart block status post pacemaker, history of CHF in the past. Who presents to the hospital complaining of several days of shortness of breath and dyspnea on exertion. The problem has been gradual in onset and has been progressively getting worse. Nothing she is aware of makes it better. Activity makes his shortness of breath worse. Persistent symptoms patient decided to come to the hospital for further evaluation and recommendations.  Assessment & Plan:    Acute exacerbation of CHF (congestive heart failure) (HCC) - Continue beta blocker given acute decompensation. - flat trend, most likely due to demand ischemia. No chest pain - Continue to monitor input and output  Active Problems:   Complete heart block (HCC) -  Pacemaker in place. Stable  Hyponatremia - Has been a chronic problem for the patient  Hypokalemia - replace and reassess - assess magnesium levels    Essential hypertension -Stable currently, would recommend diet for blood pressure control   DVT prophylaxis: (Heparin Code Status: DO NOT RESUSCITATE Family Communication: No family at bedside Disposition Plan: Pending improvement in condition   Consultants:   None   Procedures: None   Antimicrobials: None None  Subjective: Patient has no new complaints. No acute issues overnight  Objective: Filed Vitals:   10/21/15 2051 10/22/15 0415 10/22/15 0419 10/22/15 1455  BP: 140/62 142/74  138/88  Pulse: 76 60  66  Temp: 97.9 F (36.6 C) 97.9 F (36.6 C)  97.8 F (36.6 C)  TempSrc: Axillary Axillary  Axillary  Resp: Height:      Weight:   51.891 kg (114 lb 6.4 oz)   SpO2: 93% 95%  95%    Intake/Output Summary (Last  24 hours) at 10/22/15 1658 Last data filed at 10/22/15 1456  Gross per 24 hour  Intake   1230 ml  Output   7500 ml  Net  -6270 ml   Filed Weights   10/21/15 1830 10/22/15 0419  Weight: 52.39 kg (115 lb 8 oz) 51.891 kg (114 lb 6.4 oz)    Examination:  Eyes: PERRL, lids and conjunctivae normal ENMT: Mucous membranes are moist. No erythema noted minute exam Neck: normal, supple, no masses, no thyromegaly Respiratory: Decreased breath sounds at bases, no wheezes, equal chest rise, poor inspiratory effort  Cardiovascular: Regular rate and rhythm, no rubs, no murmurs, no gallops, positive lower extremity edema Abdomen: no tenderness, no masses palpated. No hepatosplenomegaly. Bowel sounds positive.  Musculoskeletal: no clubbing / cyanosis. No joint deformity upper and lower extremities. Good ROM, no contractures. Normal muscle tone.  Skin: no rashes, lesions, ulcers. No induration Neurologic: Patient answers questions appropriately, no facial asymmetry, moves extremities equally Psychiatric: Normal mood and affect.   Data Reviewed: I have personally reviewed following labs and imaging studies  CBC:  Recent Labs Lab 10/21/15 1450  WBC 11.9*  NEUTROABS 10.4*  HGB 11.8*  HCT 34.1*  MCV 87.0  PLT 251   Basic Metabolic Panel:  Recent Labs Lab 10/21/15 1450 10/22/15 0512  NA 128* 131*  K 3.7 2.7*  CL 95* 90*  CO2 21* 30  GLUCOSE 101* 106*  BUN 40* 31*  CREATININE 1.39* 1.21*  CALCIUM 8.9 8.7*   GFR: Estimated Creatinine Clearance: 23.8 mL/min (by  C-G formula based on Cr of 1.21). Liver Function Tests: No results for input(s): AST, ALT, ALKPHOS, BILITOT, PROT, ALBUMIN in the last 168 hours. No results for input(s): LIPASE, AMYLASE in the last 168 hours. No results for input(s): AMMONIA in the last 168 hours. Coagulation Profile: No results for input(s): INR, PROTIME in the last 168 hours. Cardiac Enzymes:  Recent Labs Lab 10/21/15 1450 10/21/15 1745  10/21/15 2307 10/22/15 0512  TROPONINI 0.04* 0.04* 0.05* 0.05*   BNP (last 3 results) No results for input(s): PROBNP in the last 8760 hours. HbA1C: No results for input(s): HGBA1C in the last 72 hours. CBG: No results for input(s): GLUCAP in the last 168 hours. Lipid Profile: No results for input(s): CHOL, HDL, LDLCALC, TRIG, CHOLHDL, LDLDIRECT in the last 72 hours. Thyroid Function Tests: No results for input(s): TSH, T4TOTAL, FREET4, T3FREE, THYROIDAB in the last 72 hours. Anemia Panel: No results for input(s): VITAMINB12, FOLATE, FERRITIN, TIBC, IRON, RETICCTPCT in the last 72 hours. Sepsis Labs: No results for input(s): PROCALCITON, LATICACIDVEN in the last 168 hours.  Recent Results (from the past 240 hour(s))  MRSA PCR Screening     Status: None   Collection Time: 10/21/15  6:20 PM  Result Value Ref Range Status   MRSA by PCR NEGATIVE NEGATIVE Final    Comment:        The GeneXpert MRSA Assay (FDA approved for NASAL specimens only), is one component of a comprehensive MRSA colonization surveillance program. It is not intended to diagnose MRSA infection nor to guide or monitor treatment for MRSA infections.          Radiology Studies: Dg Chest 2 View  10/21/2015  CLINICAL DATA:  Shortness of breath and cough. EXAM: CHEST  2 VIEW COMPARISON:  May 09, 2013 FINDINGS: There is mild bibasilar interstitial edema with bilateral pleural effusions. There is atelectatic change in the left base. There is cardiomegaly. The pulmonary vascularity is within normal limits. Pacemaker leads are attached to the right atrium and right ventricle. There is postoperative change in the lower thoracic and lumbar spine regions. IMPRESSION: Findings felt to represent a degree of congestive heart failure. Left base atelectasis present. Pacemaker leads attached to the right atrium and right ventricle. Electronically Signed   By: Bretta BangWilliam  Woodruff III M.D.   On: 10/21/2015 14:48         Scheduled Meds: . heparin  5,000 Units Subcutaneous Q8H  . levothyroxine  125 mcg Oral QAC breakfast  . sodium chloride flush  3 mL Intravenous Q12H   Continuous Infusions:   Time spent: > 35 minutes  Penny PiaVEGA, Ector Laurel, MD [facility] Pager [phone]  If 7PM-7AM, please contact night-coverage [email or web address] Password [email or web address] 10/22/2015, 4:58 PM

## 2015-10-22 NOTE — Progress Notes (Signed)
Lab reports K+ is 2.7 this AM and Troponi remains 0.05.  L Harduk on call and notified.

## 2015-10-22 NOTE — Care Management Note (Signed)
Case Management Note  Patient Details  Name: Bethany Landry MRN: [id number] Date of Birth: February 19, 1921  Subjective/Objective:Per dtr in law[facility] PT cons-await recc.                    Action/Plan:d/c plan home.   Expected Discharge Date:                 Expected Discharge Plan:  Home/Self Care  In-House Referral:     Discharge planning Services  CM Consult  Post Acute Care Choice:   (Lives @ Independent Living) Choice offered to:     DME Arranged:    DME Agency:     HH Arranged:    HH Agency:     Status of Service:  In process, will continue to follow  If discussed at Long Length of Stay Meetings, dates discussed:    Additional Comments:  Lanier ClamMahabir, [name], RN 10/22/2015, 12:14 PM

## 2015-10-22 NOTE — Care Management Note (Signed)
Case Management Note  Patient Details  Name: Bethany Landry MRN: [id number] Date of Birth: [birth year]  Subjective/Objective: 82 y/o f admitted w/CHF. DNR.From ALF. Received referral for Heart failure Protocal, & for PT cons if needed-Nsg notified of weight bearing status to be addressed prior PT cons.Will also inform [facility]-COPD. CSW following.                  Action/Plan:d/c plan return ALF or higher level.   Expected Discharge Date:                Expected Discharge Plan:  Assisted Living / Rest Home  In-House Referral:     Discharge planning Services  CM Consult  Post Acute Care Choice:    Choice offered to:     DME Arranged:    DME Agency:     HH Arranged:    HH Agency:     Status of Service:  In process, will continue to follow  If discussed at Long Length of Stay Meetings, dates discussed:    Additional Comments:  Lanier ClamMahabir, Chaise Passarella, RN 10/22/2015, 9:41 AM

## 2015-10-22 NOTE — Care Management Obs Status (Signed)
MEDICARE OBSERVATION STATUS NOTIFICATION   Patient Details  Name: Bethany Landry MRN: [id number] Date of Birth: Sep 24, 1920   Medicare Observation Status Notification Given:  Yes    Lanier ClamMahabir, Latravious Levitt, RN 10/22/2015, 12:15 PM

## 2015-10-22 NOTE — Evaluation (Signed)
Physical Therapy Evaluation Patient Details Name: Bethany Landry MRN: [id number] DOB: 04/29/1920 Today's Date: 10/22/2015   History of Present Illness  Bethany Landry is a 82 y.o. female with medical history significant of hypothyroidism, CK D stage III, essential hypertension, history of third-degree heart block status post pacemaker, history of CHF in the past. Who presents to the hospital complaining of several days of shortness of breath and dyspnea on exertion.In the ED patient was found to have an elevated BNP of almost 3000, , and sodium of 128. Chest x-ray was obtained which reported findings felt to be secondary to congestive heart failure     Clinical Impression  The patient ambulated to the bathroom 10'x 2, dyspnea 3-4/4, sats 96% RA , HR 96. Patient is unsteady and recommend that she have assistance at DC. Pt admitted with above diagnosis. Pt currently with functional limitations due to the deficits listed below (see PT Problem List). Pt will benefit from skilled PT to increase their independence and safety with mobility to allow discharge to the venue listed below.       Follow Up Recommendations SNF;Supervision/Assistance - 24 hour;Supervision - Intermittentor HHPT if SNF not an option-nofamily present.     Equipment Recommendations  None recommended by PT    Recommendations for Other Services       Precautions / Restrictions Precautions Precautions: Fall Precaution Comments: monitor VS      Mobility  Bed Mobility Overal bed mobility: Needs Assistance Bed Mobility: Supine to Sit;Sit to Supine     Supine to sit: Supervision Sit to supine: Supervision      Transfers Overall transfer level: Needs assistance Equipment used: Rolling walker (2 wheeled) Transfers: Sit to/from Stand Sit to Stand: Min assist         General transfer comment: cues for safety/ temds to move fast  Ambulation/Gait Ambulation/Gait assistance: Min assist Ambulation Distance (Feet): 10  Feet (x 2\) Assistive device: Rolling walker (2 wheeled) Gait Pattern/deviations: Step-through pattern;Staggering right;Staggering left     General Gait Details: moving quickly, cues for safety  Stairs            Wheelchair Mobility    Modified Rankin (Stroke Patients Only)       Balance                                             Pertinent Vitals/Pain Pain Assessment: No/denies pain    Home Living Family/patient expects to be discharged to::  ([facility] independent living)                      Prior Function Level of Independence: Independent with assistive device(s)         Comments: patient was not a great historian, no family present.     Hand Dominance        Extremity/Trunk Assessment   Upper Extremity Assessment: Generalized weakness           Lower Extremity Assessment: Generalized weakness      Cervical / Trunk Assessment: Kyphotic  Communication   Communication: HOH  Cognition Arousal/Alertness: Awake/alert Behavior During Therapy: WFL for tasks assessed/performed Overall Cognitive Status: Impaired/Different from baseline Area of Impairment: Orientation Orientation Level: Disoriented to                  General Comments      Exercises  Assessment/Plan    PT Assessment Patient needs continued PT services  PT Diagnosis Difficulty walking;Generalized weakness   PT Problem List Decreased strength;Decreased activity tolerance;Decreased balance;Decreased mobility;Decreased knowledge of precautions;Decreased safety awareness;Decreased knowledge of use of DME  PT Treatment Interventions DME instruction;Gait training;Functional mobility training;Therapeutic activities;Therapeutic exercise;Patient/family education   PT Goals (Current goals can be found in the Care Plan section) Acute Rehab PT Goals Patient Stated Goal: not stated PT Goal Formulation: Patient unable to participate in goal  setting Time For Goal Achievement: 11/05/15 Potential to Achieve Goals: Fair    Frequency Min 3X/week   Barriers to discharge Decreased caregiver support uncertain if patient gets assistance, no family present, patient not clear.    Co-evaluation               End of Session   Activity Tolerance: Patient limited by fatigue Patient left: in bed;with call bell/phone within reach;with bed alarm set Nurse Communication: Mobility status    Functional Assessment Tool Used: clinical judgement Functional Limitation: Mobility: Walking and moving around Mobility: Walking and Moving Around Current Status ([id number](G8978): At least 20 percent but less than 40 percent impaired, limited or restricted Mobility: Walking and Moving Around Goal Status [phone](G8979): At least 1 percent but less than 20 percent impaired, limited or restricted    Time: 1425-1453 PT Time Calculation (min) (ACUTE ONLY): 28 min   Charges:   PT Evaluation $PT Eval Low Complexity: 1 Procedure PT Treatments $Gait Training: 8-22 mins   PT G Codes:   PT G-Codes **NOT FOR INPATIENT CLASS** Functional Assessment Tool Used: clinical judgement Functional Limitation: Mobility: Walking and moving around Mobility: Walking and Moving Around Current Status (U9811(G8978): At least 20 percent but less than 40 percent impaired, limited or restricted Mobility: Walking and Moving Around Goal Status [phone](G8979): At least 1 percent but less than 20 percent impaired, limited or restricted    Bethany Landry, Bethany Landry Bethany Landry 10/22/2015, 3:42 PM Blanchard KelchKaren Yony Roulston PT [phone]

## 2015-10-22 NOTE — Progress Notes (Signed)
Echocardiogram 2D Echocardiogram has been performed.  Nolon RodBrown, Tony 10/22/2015, 1:05 PM

## 2015-10-23 ENCOUNTER — Telehealth: Payer: Self-pay | Admitting: Interventional Cardiology

## 2015-10-23 DIAGNOSIS — Z950 Presence of cardiac pacemaker: Secondary | ICD-10-CM

## 2015-10-23 DIAGNOSIS — I5021 Acute systolic (congestive) heart failure: Secondary | ICD-10-CM

## 2015-10-23 LAB — BASIC METABOLIC PANEL
ANION GAP: 8 (ref 5–15)
Anion gap: 11 (ref 5–15)
BUN: 27 mg/dL — ABNORMAL HIGH (ref 6–20)
BUN: 24 mg/dL — AB (ref 6–20)
CALCIUM: 8.4 mg/dL — AB (ref 8.9–10.3)
CALCIUM: 8.5 mg/dL — AB (ref 8.9–10.3)
CO2: 32 mmol/L (ref 22–32)
CO2: 30 mmol/L (ref 22–32)
CREATININE: 1.35 mg/dL — AB (ref 0.44–1.00)
Chloride: 88 mmol/L — ABNORMAL LOW (ref 101–111)
Chloride: 87 mmol/L — ABNORMAL LOW (ref 101–111)
Creatinine, Ser: 1.29 mg/dL — ABNORMAL HIGH (ref 0.44–1.00)
GFR calc Af Amer: 40 mL/min — ABNORMAL LOW (ref >60)
GFR, EST AFRICAN AMERICAN: 38 mL/min — AB (ref >60)
GFR, EST NON AFRICAN AMERICAN: 33 mL/min — AB (ref >60)
GFR, EST NON AFRICAN AMERICAN: 35 mL/min — AB (ref >60)
GLUCOSE: 105 mg/dL — AB (ref 65–99)
Glucose, Bld: 112 mg/dL — ABNORMAL HIGH (ref 65–99)
Potassium: 3.3 mmol/L — ABNORMAL LOW (ref 3.5–5.1)
Potassium: 3.2 mmol/L — ABNORMAL LOW (ref 3.5–5.1)
SODIUM: 128 mmol/L — AB (ref 135–145)
SODIUM: 128 mmol/L — AB (ref 135–145)

## 2015-10-23 LAB — MAGNESIUM: MAGNESIUM: 1.6 mg/dL — AB (ref 1.7–2.4)

## 2015-10-23 MED ORDER — ASPIRIN 81 MG PO CHEW
81.0000 mg | CHEWABLE_TABLET | Freq: Every day | ORAL | Status: DC
  Administered 2015-10-24 – 2015-10-26 (×3): 81 mg via ORAL
  Filled 2015-10-23 (×3): qty 1

## 2015-10-23 MED ORDER — FUROSEMIDE 40 MG PO TABS
40.0000 mg | ORAL_TABLET | Freq: Every day | ORAL | Status: DC
  Administered 2015-10-24 – 2015-10-26 (×3): 40 mg via ORAL
  Filled 2015-10-23 (×3): qty 1

## 2015-10-23 MED ORDER — POTASSIUM CHLORIDE CRYS ER 20 MEQ PO TBCR
40.0000 meq | EXTENDED_RELEASE_TABLET | Freq: Once | ORAL | Status: AC
  Administered 2015-10-23: 40 meq via ORAL
  Filled 2015-10-23: qty 2

## 2015-10-23 MED ORDER — CARVEDILOL 3.125 MG PO TABS
3.1250 mg | ORAL_TABLET | Freq: Two times a day (BID) | ORAL | Status: DC
  Administered 2015-10-23 – 2015-10-26 (×6): 3.125 mg via ORAL
  Filled 2015-10-23 (×7): qty 1

## 2015-10-23 MED ORDER — MAGNESIUM SULFATE IN D5W 1-5 GM/100ML-% IV SOLN
1.0000 g | Freq: Once | INTRAVENOUS | Status: AC
  Administered 2015-10-23: 1 g via INTRAVENOUS
  Filled 2015-10-23: qty 100

## 2015-10-23 MED ORDER — MAGNESIUM SULFATE 2 GM/50ML IV SOLN
2.0000 g | Freq: Once | INTRAVENOUS | Status: AC
  Administered 2015-10-23: 2 g via INTRAVENOUS
  Filled 2015-10-23: qty 50

## 2015-10-23 NOTE — Clinical Social Work Note (Signed)
Clinical Social Work Assessment  Patient Details  Name: Bethany Landry MRN: [id number] Date of Birth: Mar 21, 1921  Date of referral:  10/23/15               Reason for consult:  Facility Placement                Permission sought to share information with:  Facility Industrial/product designerContact Representative Permission granted to share information::  Yes, Verbal Permission Granted  Name::        Agency::     Relationship::     Contact Information:     Housing/Transportation Living arrangements for the past 2 months:  Independent Living Facility Source of Information:  Patient, Adult Children Patient Interpreter Needed:  None Criminal Activity/Legal Involvement Pertinent to Current Situation/Hospitalization:  No - Comment as needed Significant Relationships:  Adult Children Lives with:  Facility Resident Do you feel safe going back to the place where you live?  No Need for family participation in patient care:  Yes (Comment)  Care giving concerns:  CSW reviewed PT evaluation recommending SNF at discharge.    Social Worker assessment / plan:  CSW spoke with patient & son, Earl LitesGregory at bedside re: discharge planning. Son informed CSW that [facility], but they are agreeable with plan for her to go to SNF.   Employment status:  Retired Health and safety inspectornsurance information:  Medicare PT Recommendations:  Skilled Nursing Facility Information / Referral to community resources:  Skilled Nursing Facility  Patient/Family's Response to care:  CSW provided patient's [place] & will follow-up with bed offers. Patient's son informed CSW that patient has never been to SNF, and was quite independent prior to hospitalization.   Patient/Family's Understanding of and Emotional Response to Diagnosis, Current Treatment, and Prognosis:    Emotional Assessment Appearance:  Appears stated age Attitude/Demeanor/Rapport:    Affect (typically observed):     Orientation:  Oriented to Self, Oriented to Place, Oriented to  Time Alcohol / Substance use:    Psych involvement (Current and /or in the community):     Discharge Needs  Concerns to be addressed:    Readmission within the last 30 days:    Current discharge risk:    Barriers to Discharge:      Arlyss RepressHarrison, Chelby Salata F, LCSW 10/23/2015, 1:23 PM

## 2015-10-23 NOTE — Plan of Care (Signed)
Problem: Activity: Goal: Capacity to carry out activities will improve Continue.  [facility]  Continue with plan of care.  Problem: Cardiac: Goal: Ability to achieve and maintain adequate cardiopulmonary perfusion will improve Outcome: Progressing .  Problem: Education: Goal: Ability to demonstrate managment of disease process will improve Continue.  Pt sleepy, oriented to person only.  Forgetful, confused.

## 2015-10-23 NOTE — Progress Notes (Addendum)
Bladder scan yielded 550 ml. Patient refusing to get out of bed to the commode.  PCP on call was notified.

## 2015-10-23 NOTE — Telephone Encounter (Signed)
New message    Dr. Diamantina MonksGreg Corsi  - son calling wants to have a discussion with Dr. [name].     [facility] wants Dr. [name] to look at most recent echo & has additional questions for Dr. [name].

## 2015-10-23 NOTE — Progress Notes (Signed)
Patient refuses to have labs drawn this am. Lab technician stated that they would try to draw blood later this am.  PCP was notified.

## 2015-10-23 NOTE — Progress Notes (Signed)
PROGRESS NOTE    Bethany Landry  ZOX:[id number] DOB: May 28, 1920 DOA: 10/21/2015 PCP: Clelia Schaumann, MD   Brief Narrative:  Bethany Landry is a 82 y.o. female with medical history significant of hypothyroidism, CK D stage III, essential hypertension, history of third-degree heart block status post pacemaker, history of CHF in the past. Who presents to the hospital complaining of several days of shortness of breath and dyspnea on exertion. The problem has been gradual in onset and has been progressively getting worse. Nothing she is aware of makes it better. Activity makes his shortness of breath worse. Persistent symptoms patient decided to come to the hospital for further evaluation and recommendations.  Assessment & Plan:    Acute exacerbation of CHF (congestive heart failure) [facility]) - Cardiology consulted and will manage. - Received 60 mg of IV lasix during initial first two days of hospitalization  Active Problems:   Complete heart block (HCC) -  Pacemaker in place. Stable  Hyponatremia - Has been a chronic problem for the patient  Hypokalemia - replace and reassess  Hypomagnesemia - replace and reassess next am.     Essential hypertension -Stable currently, would recommend diet for blood pressure control   DVT prophylaxis: Heparin Code Status: DO NOT RESUSCITATE Family Communication: No family at bedside Disposition Plan: Pending improvement in condition   Consultants:   None   Procedures: None   Antimicrobials: None None  Subjective: Patient has no new complaints. No acute issues overnight  Objective: Filed Vitals:   10/22/15 2155 10/23/15 0500 10/23/15 0503 10/23/15 1500  BP: 129/70  132/77 126/68  Pulse: 85  81 79  Temp: 97.5 F (36.4 C)  97.7 F (36.5 C) 98 F (36.7 C)  TempSrc: Oral  Oral Oral  Resp: Height:      Weight:  49.079 kg (108 lb 3.2 oz)    SpO2: 96%  95% 96%    Intake/Output Summary (Last 24 hours) at 10/23/15  1653 Last data filed at 10/23/15 1402  Gross per 24 hour  Intake    580 ml  Output    975 ml  Net   -395 ml   Filed Weights   10/21/15 1830 10/22/15 0419 10/23/15 0500  Weight: 52.39 kg (115 lb 8 oz) 51.891 kg (114 lb 6.4 oz) 49.079 kg (108 lb 3.2 oz)    Examination:  Eyes: PERRL, lids and conjunctivae normal ENMT: Mucous membranes are moist. No erythema noted minute exam Neck: normal, supple, no masses, no thyromegaly Respiratory: Decreased breath sounds at bases, no wheezes, equal chest rise, poor inspiratory effort  Cardiovascular: Regular rate and rhythm, no rubs, no murmurs, no gallops, positive lower extremity edema Abdomen: no tenderness, no masses palpated. No hepatosplenomegaly. Bowel sounds positive.  Musculoskeletal: no clubbing / cyanosis. No joint deformity upper and lower extremities. Good ROM, no contractures. Normal muscle tone.  Skin: no rashes, lesions, ulcers. No induration Neurologic: Patient answers questions appropriately, no facial asymmetry, moves extremities equally Psychiatric: Normal mood and affect.   Data Reviewed: I have personally reviewed following labs and imaging studies  CBC:  Recent Labs Lab 10/21/15 1450  WBC 11.9*  NEUTROABS 10.4*  HGB 11.8*  HCT 34.1*  MCV 87.0  PLT 251   Basic Metabolic Panel:  Recent Labs Lab 10/21/15 1450 10/22/15 0512 10/23/15 0008 10/23/15 0708  NA 128* 131* 128* 128*  K 3.7 2.7* 3.3* 3.2*  CL 95* 90* 88* 87*  CO2 21* 30 32 30  GLUCOSE 101*  106* 105* 112*  BUN 40* 31* 27* 24*  CREATININE 1.39* 1.21* 1.29* 1.35*  CALCIUM 8.9 8.7* 8.4* 8.5*  MG  --   --   --  1.6*   GFR: Estimated Creatinine Clearance: 20.2 mL/min (by C-G formula based on Cr of 1.35). Liver Function Tests: No results for input(s): AST, ALT, ALKPHOS, BILITOT, PROT, ALBUMIN in the last 168 hours. No results for input(s): LIPASE, AMYLASE in the last 168 hours. No results for input(s): AMMONIA in the last 168 hours. Coagulation  Profile: No results for input(s): INR, PROTIME in the last 168 hours. Cardiac Enzymes:  Recent Labs Lab 10/21/15 1450 10/21/15 1745 10/21/15 2307 10/22/15 0512  TROPONINI 0.04* 0.04* 0.05* 0.05*   BNP (last 3 results) No results for input(s): PROBNP in the last 8760 hours. HbA1C: No results for input(s): HGBA1C in the last 72 hours. CBG: No results for input(s): GLUCAP in the last 168 hours. Lipid Profile: No results for input(s): CHOL, HDL, LDLCALC, TRIG, CHOLHDL, LDLDIRECT in the last 72 hours. Thyroid Function Tests: No results for input(s): TSH, T4TOTAL, FREET4, T3FREE, THYROIDAB in the last 72 hours. Anemia Panel: No results for input(s): VITAMINB12, FOLATE, FERRITIN, TIBC, IRON, RETICCTPCT in the last 72 hours. Sepsis Labs: No results for input(s): PROCALCITON, LATICACIDVEN in the last 168 hours.  Recent Results (from the past 240 hour(s))  MRSA PCR Screening     Status: None   Collection Time: 10/21/15  6:20 PM  Result Value Ref Range Status   MRSA by PCR NEGATIVE NEGATIVE Final    Comment:        The GeneXpert MRSA Assay (FDA approved for NASAL specimens only), is one component of a comprehensive MRSA colonization surveillance program. It is not intended to diagnose MRSA infection nor to guide or monitor treatment for MRSA infections.          Radiology Studies: No results found.      Scheduled Meds: . [START ON 10/24/2015] aspirin  81 mg Oral Daily  . carvedilol  3.125 mg Oral BID WC  . [START ON 10/24/2015] furosemide  40 mg Oral Daily  . heparin  5,000 Units Subcutaneous Q8H  . levothyroxine  125 mcg Oral QAC breakfast  . magnesium sulfate 1 - 4 g bolus IVPB  2 g Intravenous Once  . potassium chloride  40 mEq Oral Once  . sodium chloride flush  3 mL Intravenous Q12H   Continuous Infusions:   Time spent: > 35 minutes  Penny PiaVEGA, Hosey Burmester, MD [facility] Pager [phone]  If 7PM-7AM, please contact  night-coverage [email or web address] Password [email or web address] 10/23/2015, 4:53 PM

## 2015-10-23 NOTE — Clinical Documentation Improvement (Signed)
Family Medicine  Can the diagnosis of CHF be further specified?    Acuity - Acute, Chronic, Acute on Chronic   Type - Systolic, Diastolic, Systolic and Diastolic  Other  Clinically Undetermined   Document any associated diagnoses/conditions   Supporting Information:  82 year old patient with shortness of breath and dyspnea on exertion  H&P: Assessment/Plan Active Problems:  Acute exacerbation of CHF (congestive heart failure) (HCC) - Monitor on telemetry -Obtain echocardiogram -Obtain troponins every 6 hours 3 sets  7/10 progress note: Assessment & Plan:  Acute exacerbation of CHF (congestive heart failure) (HCC) - Continue beta blocker given acute decompensation. - flat trend, most likely due to demand ischemia. No chest pain - Continue to monitor input and output  7/10 Echo: EF 25-30%  Component     Latest Ref Rng 10/21/2015 10/21/2015 10/21/2015 10/22/2015         2:50 PM  5:45 PM 11:07 PM   Troponin I     <0.03 ng/mL 0.04 (HH) 0.04 (HH) 0.05 (HH) 0.05 (HH)   Component     Latest Ref Rng 10/21/2015  B Natriuretic Peptide     0.0 - 100.0 pg/mL 2973.0 (H)   Treatments Echo Troponin x 3 BNP Daily weights Cardiac monitoring Strict I&O  Please exercise your independent, professional judgment when responding. A specific answer is not anticipated or expected.   Thank You,  Harless Littenebora T Azavier Creson [facility] Information [facility] [place] [phone]

## 2015-10-23 NOTE — Consult Note (Signed)
Patient ID: Bethany Landry MRN: [id number], DOB/AGE: 1920-06-04   Admit date: 10/21/2015   Reason for Consult: Acute on chronic CHF; worsening LV dysfunction Requesting MD: Dr. Cena Benton, Internal Medicine  Primary Physician: Clelia Schaumann, MD Primary Cardiologist: Dr. Katrinka Blazing Electrophysiologist: Dr. Johney Frame  Pt. Profile:  82 y/o female with h/o HTN, chronic systolic HF with prior EF of 45%, HLD, CKD and SSS s/p PPM admitted for acute on chronic CHF.   Problem List  Past Medical History  Diagnosis Date  . Osteoporosis   . Benign hypertension   . Hyperlipidemia   . Osteoarthritis   . Insomnia   . Hypothyroidism   . Complete heart block (HCC)   . Mastoiditis     hearing loss, s/p mastoiectomy, right ear  . DOE (dyspnea on exertion)   . CKD (chronic kidney disease), stage III     moderate  . Sinoatrial node dysfunction (HCC)   . Fungal infection     recurrent    Past Surgical History  Procedure Laterality Date  . Mastoidectomy Right     Tumor removal and residual recurrent fungal infection. Dr. Pollyann Kennedy  . Thyroidectomy    . Back surgery    . Laparotomy      Ex lap for abdominal pain (ischemic colitis)  . Insert / replace / remove pacemaker  11/26/09    [employer] Jude, DDD PM  . Cataract extraction       Allergies  Allergies  Allergen Reactions  . Norvasc [Amlodipine Besylate] Swelling    Swelling in ankles  . Quinapril Hcl Swelling  . Ace Inhibitors Swelling and Cough    HPI  82 y/o female with h/o HTN, chronic systolic HF with prior EF of 45%, HLD, CKD and SSS s/p PPM admitted for acute on chronic CHF. She has no known h/o coronary disease.   She presented to the [facility] ED with complaint of increasing dyspnea over a 3 day perior. Mainly with exertion. No CP. She does note occasional tachy plications but no dizziness, syncope/ near syncope.   Prior to admit, she had an echo 04/2015 that showed an EF of 45-50% with mild diffuse hypokinesis. 2D echo this admit shows  reduction in LVEF, now at 25-30%, grade 2DD and moderate AI. Echo also shows a large pleural effusion. CXR shows mild bibasilar interstitial edema with bilateral pleural effusions. There is atelectatic change in the left base. There is cardiomegaly. BNP is 2973. She has chronic renal insufficiency. SCr is 1.35, which appears to be her baseline. Her breathing has improved. No dyspnea at rest. No supplemental O2 requirements. She continues to deny chest pain. Troponin level abnormal but with flat low level trend 0.04>0.05.  Of note, she also has a prior h/o orthostatic hypotension. She was admitted 04/2015 for a fall in the setting of orthostatic hypotension and her BB had to be reduce.   Home Medications  Prior to Admission medications   Medication Sig Start Date End Date Taking? Authorizing Provider  hydrochlorothiazide (MICROZIDE) 12.5 MG capsule TAKE 1 CAPSULE BY MOUTH ONCE DAILY 05/31/15  Yes Historical Provider, MD  levothyroxine (SYNTHROID, LEVOTHROID) 125 MCG tablet Take 1 TABLET BY MOUTH ONCE DAILY 11/17/14  Yes Historical Provider, MD  metoprolol (LOPRESSOR) 50 MG tablet Take 1 tablet by mouth 2 (two) times daily. 05/03/14  Yes Historical Provider, MD   Hospital Meds . heparin  5,000 Units Subcutaneous Q8H  . levothyroxine  125 mcg Oral QAC breakfast  . magnesium sulfate 1 - 4  g bolus IVPB  1 g Intravenous Once  . sodium chloride flush  3 mL Intravenous Q12H   Family History  Family History  Problem Relation Age of Onset  . CAD Brother   . Cerebral aneurysm Brother   . Heart attack Mother     Social History  Social History   Social History  . Marital Status: Divorced    Spouse Name: N/A  . Number of Children: N/A  . Years of Education: N/A   Occupational History  . Not on file.   Social History Main Topics  . Smoking status: Former Smoker    Types: Cigarettes    Quit date: 04/14/1982  . Smokeless tobacco: Not on file  . Alcohol Use: Yes     Comment: wine,  occasionally  . Drug Use: No  . Sexual Activity: Not on file   Other Topics Concern  . Not on file   Social History Narrative     Review of Systems General:  No chills, fever, night sweats or weight changes.  Cardiovascular:  No chest pain, dyspnea on exertion, edema, orthopnea, palpitations, paroxysmal nocturnal dyspnea. Dermatological: No rash, lesions/masses Respiratory: No cough, dyspnea Urologic: No hematuria, dysuria Abdominal:   No nausea, vomiting, diarrhea, bright red blood per rectum, melena, or hematemesis Neurologic:  No visual changes, wkns, changes in mental status. All other systems reviewed and are otherwise negative except as noted above.  Physical Exam  Blood pressure 132/77, pulse 81, temperature 97.7 F (36.5 C), temperature source Oral, resp. rate 20, height 5\' 7"  (1.702 m), weight 108 lb 3.2 oz (49.079 kg), SpO2 95 %.  General: Pleasant, NAD, elderly and frail Psych: Normal affect. Neuro: Alert and oriented X 3. Moves all extremities spontaneously. HEENT: Normal  Neck: Supple without bruits or JVD. Lungs:  Resp regular and unlabored, decreased BS bilaterally Heart: RRR no s3, s4, or murmurs. Abdomen: Soft, non-tender, non-distended, BS + x 4.  Extremities: No clubbing, cyanosis or edema. DP/PT/Radials 2+ and equal bilaterally.  Labs  Troponin (Point of Care Test) No results for input(s): TROPIPOC in the last 72 hours.  Recent Labs  10/21/15 1450 10/21/15 1745 10/21/15 2307 10/22/15 0512  TROPONINI 0.04* 0.04* 0.05* 0.05*   Lab Results  Component Value Date   WBC 11.9* 10/21/2015   HGB 11.8* 10/21/2015   HCT 34.1* 10/21/2015   MCV 87.0 10/21/2015   PLT 251 10/21/2015     Recent Labs Lab 10/23/15 0708  NA 128*  K 3.2*  CL 87*  CO2 30  BUN 24*  CREATININE 1.35*  CALCIUM 8.5*  GLUCOSE 112*   No results found for: CHOL, HDL, LDLCALC, TRIG No results found for: DDIMER   Radiology/Studies  Dg Chest 2 View  10/21/2015  CLINICAL  DATA:  Shortness of breath and cough. EXAM: CHEST  2 VIEW COMPARISON:  May 09, 2013 FINDINGS: There is mild bibasilar interstitial edema with bilateral pleural effusions. There is atelectatic change in the left base. There is cardiomegaly. The pulmonary vascularity is within normal limits. Pacemaker leads are attached to the right atrium and right ventricle. There is postoperative change in the lower thoracic and lumbar spine regions. IMPRESSION: Findings felt to represent a degree of congestive heart failure. Left base atelectasis present. Pacemaker leads attached to the right atrium and right ventricle. Electronically Signed   By: Bretta BangWilliam  Woodruff III M.D.   On: 10/21/2015 14:48    ECG  A-V dual-paced complexes w/ some inhibition  Echocardiogram 10/22/15 Study Conclusions  -  Left ventricle: The cavity size was normal. Wall thickness was  normal. Systolic function was severely reduced. The estimated  ejection fraction was in the range of 25% to 30%. Diffuse  hypokinesis. Features are consistent with a pseudonormal left  ventricular filling pattern, with concomitant abnormal relaxation  and increased filling pressure (grade 2 diastolic dysfunction). - Aortic valve: Trileaflet; mildly thickened, mildly calcified  leaflets. There was moderate regurgitation. - Pericardium, extracardiac: There was a left pleural effusion.  Impressions:  - When compared to prior echocardiogram, EF is reduced (prior 40%).   ASSESSMENT AND PLAN  Active Problems:   Complete heart block (HCC)   Essential hypertension   Acute exacerbation of CHF (congestive heart failure) (HCC)   1. Acute on Chronic Combined Systolic + Diastolic CHF: BNP is abnormal at 2973. Admission CXR showed cardiomegaly with mild bibasilar interstitial edema with bilateral pleural effusions. There is atelectatic change in the left base. She has received 2 doses of IV lasix, 60 mg on 7/9 and 7/10.  Good diuresis thus far, I/Os  net negative 6.6L since admit. There is no significant  peripheral edema. Her weight is down from 126 lb at time of last OV 08/10/15 to 108 lb today. Her breathing has improved.   2. Systolic LV Dysfunction:  2D echo shows further reduction in EF down from 45-50% previously, now down to 25-30% with diffuse hypokinesis and moderate AI. ? Etiology. She denies any recent fever, chills/ viral illnesses. She does note recent episode of tachy palpitations. We will interrogate her PPM to assess for any potential underlying arrhthymias that may have caused her cardiomyopathy. She denies any recent CP but has had exertional dyspnea (? Anginal equivalent vs CHF related dyspnea). She has no documented h/o CAD. Given her advanced age of 51, she may not be an ideal candidate for LHC to assess for underlying CAD. Will defer to MD. For now, treat medically if BP allows. Typically, we would recommended starting with a low dose ACE-I +/- BB, however she has had issues with symptomatic orthostatic hypotension in the past, thus we would need to use caution with these agents.  Low sodium diet. Continue to monitor volume status closely.   3. PPM: inserted for SSS. Followed by Dr. Johney Frame. This is a Statistician. We will see if we can get this interrogated to assess for potential atrial/ventricular arrhthymias given her new cardiomyopathy. I have contacted the device rep. Device will be interrogated later today.   4. Abnormal Troponin: low level trend, 0.04>0.05. She denies CP. Suspect abnormal troponin is likely secondary to demand ischemia from acute on chronic CHF. However, with new reduction in EF down to 25-30%, underlying CAD is not excluded. MD to determine if patient is a candidate for LHC. She is not ideal given advanced age. Medical therapy only may be more appropriate.    Signed, Robbie Lis, PA-C 10/23/2015, 2:58 PM

## 2015-10-23 NOTE — Telephone Encounter (Signed)
I spoke with Dr Mendel RyderH Eichhorn,  will forward to Dr Mendel RyderH [place] for review.

## 2015-10-23 NOTE — Telephone Encounter (Signed)
Spoke to sign. [facility].

## 2015-10-23 NOTE — Clinical Social Work Placement (Signed)
   CLINICAL SOCIAL WORK PLACEMENT  NOTE  Date:  10/23/2015  Patient Details  Name: Bethany Landry MRN: [id number] Date of Birth: 12-24-1920  Clinical Social Work is seeking post-discharge placement for this patient at the Skilled  Nursing Facility level of care (*CSW will initial, date and re-position this form in  chart as items are completed):  Yes   Patient/family provided with [place] Clinical Social Work Department's list of facilities offering this level of care within the geographic area requested by the patient (or if unable, by the patient's family).  Yes   Patient/family informed of their freedom to choose among providers that offer the needed level of care, that participate in Medicare, Medicaid or managed care program needed by the patient, have an available bed and are willing to accept the patient.  Yes   Patient/family informed of [place]'s ownership interest in [facility] and [facility], as well as of the fact that they are under no obligation to receive care at these facilities.  PASRR submitted to EDS on 10/23/15     PASRR number received on 10/23/15     Existing PASRR number confirmed on       FL2 transmitted to all facilities in geographic area requested by pt/family on 10/23/15     FL2 transmitted to all facilities within larger geographic area on       Patient informed that his/her managed care company has contracts with or will negotiate with certain facilities, including the following:            Patient/family informed of bed offers received.  Patient chooses bed at       Physician recommends and patient chooses bed at      Patient to be transferred to   on  .  Patient to be transferred to facility by       Patient family notified on   of transfer.  Name of family member notified:        PHYSICIAN       Additional Comment:    _______________________________________________ Arlyss RepressHarrison, Ayesha Markwell F, LCSW 10/23/2015, 1:25 PM

## 2015-10-23 NOTE — NC FL2 (Signed)
[place] MEDICAID FL2 LEVEL OF CARE SCREENING TOOL     IDENTIFICATION  Patient Name: Bethany Landry Birthdate: 28-Jan-1921 Sex: female Admission Date (Current Location): 10/21/2015  [facility] and [place] Number:  Producer, television/film/video and Address:  [facility],  [address] [place] [address]      Provider Number: [id number]  Attending Physician Name and Address:  Penny Pia, MD  Relative Name and Phone Number:       Current Level of Care: Hospital Recommended Level of Care: Skilled Nursing Facility Prior Approval Number:    Date Approved/Denied:   PASRR Number: [id number]  Discharge Plan: Home    Current Diagnoses: Patient Active Problem List   Diagnosis Date Noted  . Acute exacerbation of CHF (congestive heart failure) (HCC) 10/21/2015  . Syncope 05/04/2015  . Fall 05/04/2015  . Elevated systolic blood pressure 05/04/2015  . CKD (chronic kidney disease), stage III 05/04/2015  . Chronic hyponatremia 05/04/2015  . Occipital scalp laceration 05/04/2015  . Interstitial lung disease (HCC) 06/14/2013  . Cardiac pacemaker 06/14/2013  . Complete heart block (HCC) 05/05/2013  . Essential hypertension 05/05/2013    Orientation RESPIRATION BLADDER Height & Weight     Self, Time, Situation, Place  Normal Continent Weight: 108 lb 3.2 oz (49.079 kg) Height:   (170.2 cm)  BEHAVIORAL SYMPTOMS/MOOD NEUROLOGICAL BOWEL NUTRITION STATUS      Continent Diet (Heart)  AMBULATORY STATUS COMMUNICATION OF NEEDS Skin   Extensive Assist Verbally Normal                       Personal Care Assistance Level of Assistance  Bathing, Dressing Bathing Assistance: Limited assistance   Dressing Assistance: Limited assistance     Functional Limitations Info             SPECIAL CARE FACTORS FREQUENCY  PT (By licensed PT), OT (By licensed OT)     PT Frequency: 5 OT Frequency: 5            Contractures      Additional Factors Info  Code Status,  Allergies Code Status Info: DNR Allergies Info: Allergies:  Norvasc, Quinapril Hcl, Ace Inhibitors           Current Medications (10/23/2015):  This is the current hospital active medication list Current Facility-Administered Medications  Medication Dose Route Frequency Provider Last Rate Last Dose  . 0.9 %  sodium chloride infusion  250 mL Intravenous PRN Penny Pia, MD      . acetaminophen (TYLENOL) tablet 650 mg  650 mg Oral Q4H PRN Penny Pia, MD      . heparin injection 5,000 Units  5,000 Units Subcutaneous Q8H Penny Pia, MD   5,000 Units at 10/22/15 2139  . levothyroxine (SYNTHROID, LEVOTHROID) tablet 125 mcg  125 mcg Oral QAC breakfast Penny Pia, MD   125 mcg at 10/23/15 0829  . magnesium sulfate IVPB 1 g 100 mL  1 g Intravenous Once Penny Pia, MD      . ondansetron [facility]) injection 4 mg  4 mg Intravenous Q6H PRN Penny Pia, MD      . sodium chloride flush (NS) 0.9 % injection 3 mL  3 mL Intravenous Q12H Penny Pia, MD   3 mL at 10/23/15 0829  . sodium chloride flush (NS) 0.9 % injection 3 mL  3 mL Intravenous PRN Penny Pia, MD         Discharge Medications: Please see discharge summary for a list of  discharge medications.  Relevant Imaging Results:  Relevant Lab Results:   Additional Information SSN: [id number]  Arlyss RepressHarrison, Kyndall Amero F, LCSW

## 2015-10-24 DIAGNOSIS — I5023 Acute on chronic systolic (congestive) heart failure: Secondary | ICD-10-CM

## 2015-10-24 DIAGNOSIS — E43 Unspecified severe protein-calorie malnutrition: Secondary | ICD-10-CM

## 2015-10-24 DIAGNOSIS — E871 Hypo-osmolality and hyponatremia: Secondary | ICD-10-CM

## 2015-10-24 LAB — BASIC METABOLIC PANEL
Anion gap: 10 (ref 5–15)
BUN: 22 mg/dL — AB (ref 6–20)
CO2: 26 mmol/L (ref 22–32)
CREATININE: 1.21 mg/dL — AB (ref 0.44–1.00)
Calcium: 8.4 mg/dL — ABNORMAL LOW (ref 8.9–10.3)
Chloride: 89 mmol/L — ABNORMAL LOW (ref 101–111)
GFR calc Af Amer: 43 mL/min — ABNORMAL LOW (ref >60)
GFR, EST NON AFRICAN AMERICAN: 37 mL/min — AB (ref >60)
GLUCOSE: 102 mg/dL — AB (ref 65–99)
POTASSIUM: 4.4 mmol/L (ref 3.5–5.1)
SODIUM: 125 mmol/L — AB (ref 135–145)

## 2015-10-24 LAB — MAGNESIUM: MAGNESIUM: 2.4 mg/dL (ref 1.7–2.4)

## 2015-10-24 MED ORDER — ENSURE ENLIVE PO LIQD
237.0000 mL | Freq: Two times a day (BID) | ORAL | Status: DC
  Administered 2015-10-24 – 2015-10-26 (×3): 237 mL via ORAL

## 2015-10-24 NOTE — Clinical Social Work Placement (Signed)
CSW provided SNF bed offers to patient, left voicemail for patient's son Earl Lites- [place] & left bed offers at patients bedside. CSW will follow-up for SNF decision. CSW has completed FL2 & will continue to follow and assist with discharge when ready.    Lincoln MaxinKelly Nicco Reaume, LCSW [facility] Clinical Social Worker cell #: [phone]     CLINICAL SOCIAL WORK PLACEMENT  NOTE  Date:  10/24/2015  Patient Details  Name: Bethany Landry MRN: [id number] Date of Birth: 07-17-1920  Clinical Social Work is seeking post-discharge placement for this patient at the Skilled  Nursing Facility level of care (*CSW will initial, date and re-position this form in  chart as items are completed):  Yes   Patient/family provided with [facility] Clinical Social Work Department's list of facilities offering this level of care within the geographic area requested by the patient (or if unable, by the patient's family).  Yes   Patient/family informed of their freedom to choose among providers that offer the needed level of care, that participate in Medicare, Medicaid or managed care program needed by the patient, have an available bed and are willing to accept the patient.  Yes   Patient/family informed of [place]'s ownership interest in [facility] and [facility], as well as of the fact that they are under no obligation to receive care at these facilities.  PASRR submitted to EDS on 10/23/15     PASRR number received on 10/23/15     Existing PASRR number confirmed on       FL2 transmitted to all facilities in geographic area requested by pt/family on 10/23/15     FL2 transmitted to all facilities within larger geographic area on       Patient informed that his/her managed care company has contracts with or will negotiate with certain facilities, including the following:        Yes   Patient/family informed of bed offers received.  Patient chooses bed at       Physician recommends and  patient chooses bed at      Patient to be transferred to   on  .  Patient to be transferred to facility by       Patient family notified on   of transfer.  Name of family member notified:        PHYSICIAN       Additional Comment:    _______________________________________________ Arlyss RepressHarrison, Sebrena Engh F, LCSW 10/24/2015, 10:50 AM

## 2015-10-24 NOTE — Progress Notes (Addendum)
PROGRESS NOTE                                                                                                                                                                                                             Patient Demographics:    Bethany Landry, is a 82 y.o. female, DOB - Oct 20, 1920, WUJ:[id number]  Admit date - 10/21/2015   Admitting Physician Bethany Piarlando Vega, MD  Outpatient Primary MD for the patient is Bethany SchaumannIbethal J Shamleffer, MD  LOS - 2  Outpatient Specialists: Bethany Landry  Chief Complaint  Patient presents with  . Shortness of Breath       Brief Narrative   82 year old female with history of CKD stage III, essential hypertension, history of third-degree heart block status post pacemaker, chronic systolic CHF, and hypothyroidism presented with several days of progressive shortness of breath and found to have acute on chronic systolic CHF exacerbation.    Subjective:   Patient reports her breathing to be better.   Assessment  & Plan :   Principal problem Acute on chronic combined systolic and diastolic CHF (HCC) Patient presented with acute severe CHF with worsened EF of 25-30% on repeat echo with diffuse hypokinesis. Cannot rule out underlying CAD with ischemia given significant hypokinesis with elevated troponin.. No recent viral illness, PPM interrogated on 7/10 without any arrhythmias. Given her advanced age cardiology recommended against LHC and plan on optimal medical management. Patient has diuresed well with IV Lasix and switch to by mouth. Added baby aspirin and low-dose Coreg. Patient allergy to ACEi/ ARB. Has lost about 15 pounds since admission. Appears euvolemic.  Elevated troponin. Peaked at 0.05. No chest pain symptoms suspected due to demand ischemia from acute CHF. Given her worsened EF and diffuse hypokinesis cannot rule out ischemic cardiomyopathy. Again given her advanced age cardiology  recommended for optimal medical management.  Hypokalemia/hypomagnesemia Replenished daily and stable today. We will recheck again tomorrow. Likely needs potassium supplement upon discharge.  Hyponatremia Reportedly has chronic hyponatremia, levels dropped to 125 today. Now she is on oral Lasix. Recheck in a.m.  History of sick sinus syndrome status post PPM Interrogated on 7/11 showed noted venous. Follows with Bethany Landry.  Chronic kidney disease stage III Renal function at baseline  Generalized weakness Seen by PT and recommend skilled nursing facility.  Severe protein calorie malnutrition (HCC)  Added nutritional supplement.  Code Status : DO NOT RESUSCITATE  Family Communication  : None at bedside  Disposition Plan  : SNF on 7/13  Barriers For Discharge : hyponatremia  Consults  :   cardiology  Procedures  : 2d echo  DVT Prophylaxis  :  Sq heparin  Lab Results  Component Value Date   PLT 251 10/21/2015    Antibiotics  :    Anti-infectives    None        Objective:   Filed Vitals:   10/23/15 0503 10/23/15 1500 10/23/15 2104 10/24/15 0515  BP: 132/77 126/68 132/76 137/58  Pulse: 81 79 77 69  Temp: 97.7 F (36.5 C) 98 F (36.7 C) 97.6 F (36.4 C) 97.5 F (36.4 C)  TempSrc: Oral Oral Oral Oral  Resp: Height:      Weight:    45.813 kg (101 lb)  SpO2: 95% 96% 98% 98%    Wt Readings from Last 3 Encounters:  10/24/15 45.813 kg (101 lb)  09/07/15 56.246 kg (124 lb)  08/10/15 57.153 kg (126 lb)     Intake/Output Summary (Last 24 hours) at 10/24/15 1435 Last data filed at 10/24/15 1343  Gross per 24 hour  Intake    360 ml  Output   1000 ml  Net   -640 ml     Physical Exam  Gen: not in distress HEENT: no pallor, moist mucosa, supple neck Chest: clear b/l, no added sounds CVS: N S1&S2, no murmurs, rubs or gallop GI: soft, NT, ND, BS+ Musculoskeletal: warm, no edema CNS: Alert and oriented, nonfocal    Data Review:     CBC  Recent Labs Lab 10/21/15 1450  WBC 11.9*  HGB 11.8*  HCT 34.1*  PLT 251  MCV 87.0  MCH 30.1  MCHC 34.6  RDW 13.9  LYMPHSABS 0.7  MONOABS 0.8  EOSABS 0.1  BASOSABS 0.0    Chemistries   Recent Labs Lab 10/21/15 1450 10/22/15 0512 10/23/15 0008 10/23/15 0708 10/24/15 0457  NA 128* 131* 128* 128* 125*  K 3.7 2.7* 3.3* 3.2* 4.4  CL 95* 90* 88* 87* 89*  CO2 21* 30 32 30 26  GLUCOSE 101* 106* 105* 112* 102*  BUN 40* 31* 27* 24* 22*  CREATININE 1.39* 1.21* 1.29* 1.35* 1.21*  CALCIUM 8.9 8.7* 8.4* 8.5* 8.4*  MG  --   --   --  1.6* 2.4   ------------------------------------------------------------------------------------------------------------------ No results for input(s): CHOL, HDL, LDLCALC, TRIG, CHOLHDL, LDLDIRECT in the last 72 hours.  No results found for: HGBA1C ------------------------------------------------------------------------------------------------------------------ No results for input(s): TSH, T4TOTAL, T3FREE, THYROIDAB in the last 72 hours.  Invalid input(s): FREET3 ------------------------------------------------------------------------------------------------------------------ No results for input(s): VITAMINB12, FOLATE, FERRITIN, TIBC, IRON, RETICCTPCT in the last 72 hours.  Coagulation profile No results for input(s): INR, PROTIME in the last 168 hours.  No results for input(s): DDIMER in the last 72 hours.  Cardiac Enzymes  Recent Labs Lab 10/21/15 1745 10/21/15 2307 10/22/15 0512  TROPONINI 0.04* 0.05* 0.05*   ------------------------------------------------------------------------------------------------------------------    Component Value Date/Time   BNP 2973.0* 10/21/2015 1450    Inpatient Medications  Scheduled Meds: . aspirin  81 mg Oral Daily  . carvedilol  3.125 mg Oral BID WC  . feeding supplement (ENSURE ENLIVE)  237 mL Oral BID BM  . furosemide  40 mg Oral Daily  . heparin  5,000 Units Subcutaneous Q8H   . levothyroxine  125 mcg Oral QAC breakfast  . sodium  chloride flush  3 mL Intravenous Q12H   Continuous Infusions:  PRN Meds:.sodium chloride, acetaminophen, ondansetron (ZOFRAN) IV, sodium chloride flush  Micro Results Recent Results (from the past 240 hour(s))  MRSA PCR Screening     Status: None   Collection Time: 10/21/15  6:20 PM  Result Value Ref Range Status   MRSA by PCR NEGATIVE NEGATIVE Final    Comment:        The GeneXpert MRSA Assay (FDA approved for NASAL specimens only), is one component of a comprehensive MRSA colonization surveillance program. It is not intended to diagnose MRSA infection nor to guide or monitor treatment for MRSA infections.     Radiology Reports Dg Chest 2 View  10/21/2015  CLINICAL DATA:  Shortness of breath and cough. EXAM: CHEST  2 VIEW COMPARISON:  May 09, 2013 FINDINGS: There is mild bibasilar interstitial edema with bilateral pleural effusions. There is atelectatic change in the left base. There is cardiomegaly. The pulmonary vascularity is within normal limits. Pacemaker leads are attached to the right atrium and right ventricle. There is postoperative change in the lower thoracic and lumbar spine regions. IMPRESSION: Findings felt to represent a degree of congestive heart failure. Left base atelectasis present. Pacemaker leads attached to the right atrium and right ventricle. Electronically Signed   By: Bretta Bang III M.D.   On: 10/21/2015 14:48    Time Spent in minutes  25   Eddie North M.D on 10/24/2015 at 2:35 PM  Between 7am to 7pm - Pager - [phone]  After 7pm go to [email or web address] - password [facility]  [facility] -  Office  [phone]

## 2015-10-24 NOTE — Progress Notes (Signed)
Initial Nutrition Assessment  DOCUMENTATION CODES:   Severe malnutrition in context of acute illness/injury, Underweight  INTERVENTION:  - Will order Ensure Enlive po BID, each supplement provides 350 kcal and 20 grams of protein. - Continue to encourage PO intakes of meals and supplements. - RD will continue to monitor for needs.  NUTRITION DIAGNOSIS:   Malnutrition related to acute illness as evidenced by percent weight loss, moderate depletions of muscle mass.  GOAL:   Patient will meet greater than or equal to 90% of their needs  MONITOR:   PO intake, Supplement acceptance, Weight trends, Labs, I & O's  REASON FOR ASSESSMENT:   Other (Comment) (Underweight BMI)  ASSESSMENT:   82 y.o. female with medical history significant of hypothyroidism, CK D stage III, essential hypertension, history of third-degree heart block status post pacemaker, history of CHF in the past. Who presents to the hospital complaining of several days of shortness of breath and dyspnea on exertion. The problem has been gradual in onset and has been progressively getting worse. Nothing she is aware of makes it better. Activity makes his shortness of breath worse. Persistent symptoms patient decided to come to the hospital for further evaluation and recommendations.  Pt seen for underweight BMI. Per chart review, pt has mainly been consuming 50% at meals since admission. Pt sleeping at this time with no family/visitors present; did not feel it was necessary to awake pt at this time. Lunch tray on bedside table with 50-75% completion.   Did not perform physical assessment with respect to pt's comfort at this time but noted at least moderate muscle wasting around clavicle and shoulder area. Per chart review, pt lost 23 lbs (18.5% body weight) within the past 2 months which is significant for time frame. This may be partly fluid related as weight is down 14 lbs (12% body weight) since admission on 10/21/15 alone;  will continue to monitor weight trends.  Pt to go to SNF at time of d/c. Will order Ensure Enlive BID to supplement and change as needed.  Medications reviewed; 40 mg oral Lasix/day, 125 mcg Synthroid/day, 3 g Mg sulfate given 7/11, 40 mEq oral KCl given x1 dose 7/11. Labs reviewed; Na: 125 mmol/L, Cl: 89 mmol/L, BUN: 22 mg/dL, creatinine: 1.611.21 mg/dL, Ca: 8.4 mg/dL, GFR: 37 mL/min.    Diet Order:  Diet Heart Room service appropriate?: Yes; Fluid consistency:: Thin  Skin:  Reviewed, no issues  Last BM:  7/12  Height:   Ht Readings from Last 1 Encounters:  10/21/15 5\' 7"  (1.702 m)    Weight:   Wt Readings from Last 1 Encounters:  10/24/15 101 lb (45.813 kg)    Ideal Body Weight:  61.36 kg (kg)  BMI:  Body mass index is 15.82 kg/(m^2).  Estimated Nutritional Needs:   Kcal:  1100-1300  Protein:  45-55 grams  Fluid:  >/= 1 L/day  EDUCATION NEEDS:   No education needs identified at this time     Trenton GammonJessica Niclas Markell, MS, RD, LDN Inpatient Clinical Dietitian Pager # [phone] After hours/weekend pager # [phone]

## 2015-10-24 NOTE — Progress Notes (Signed)
Physical Therapy Treatment Patient Details Name: Bethany Landry MRN: [id number] DOB: [birth year] Today's Date: 10/24/2015    History of Present Illness Bethany Landry is a 82 y.o. female with medical history significant of hypothyroidism, CK D stage III, essential hypertension, history of third-degree heart block status post pacemaker, history of CHF in the past. Who presents to the hospital complaining of several days of shortness of breath and dyspnea on exertion.In the ED patient was found to have an elevated BNP of almost 3000, , and sodium of 128. Chest x-ray was obtained which reported findings felt to be secondary to congestive heart failure       PT Comments    Progressing slowly with mobility. Mod encouragement for OOB/ambulation. Dyspnea with short walk of ~40 feet. Continue to recommend SNF.  Follow Up Recommendations  SNF     Equipment Recommendations  None recommended by PT    Recommendations for Other Services       Precautions / Restrictions Precautions Precautions: Fall Precaution Comments: monitor VS Restrictions Weight Bearing Restrictions: No    Mobility  Bed Mobility Overal bed mobility: Needs Assistance Bed Mobility: Supine to Sit;Sit to Supine     Supine to sit: Supervision Sit to supine: Supervision   General bed mobility comments: for safety  Transfers Overall transfer level: Needs assistance Equipment used: Rolling walker (2 wheeled) Transfers: Sit to/from Stand Sit to Stand: Min guard         General transfer comment: VCs safety, hand placement. close guard for safety.  Ambulation/Gait Ambulation/Gait assistance: Min assist Ambulation Distance (Feet): 40 Feet Assistive device: Rolling walker (2 wheeled) Gait Pattern/deviations: Step-through pattern;Decreased stride length;Trunk flexed     General Gait Details: Assist to stabilize pt and maneuver safely with RW. Dyspnea 3/4. Pt fatigues quickly.   Stairs            Wheelchair  Mobility    Modified Rankin (Stroke Patients Only)       Balance Overall balance assessment: Needs assistance         Standing balance support: Bilateral upper extremity supported;During functional activity Standing balance-Leahy Scale: Poor                      Cognition Arousal/Alertness: Awake/alert Behavior During Therapy: WFL for tasks assessed/performed Overall Cognitive Status: Within Functional Limits for tasks assessed                      Exercises      General Comments        Pertinent Vitals/Pain Pain Assessment: No/denies pain    Home Living                      Prior Function            PT Goals (current goals can now be found in the care plan section) Progress towards PT goals: Progressing toward goals    Frequency  Min 3X/week    PT Plan Current plan remains appropriate    Co-evaluation             End of Session Equipment Utilized During Treatment: Gait belt Activity Tolerance: Patient limited by fatigue Patient left: in bed;with call bell/phone within reach;with bed alarm set     Time: 1347-1355 PT Time Calculation (min) (ACUTE ONLY): 8 min  Charges:  $Gait Training: 8-22 mins  G Codes:      Weston Anna, MPT Pager: [phone]

## 2015-10-24 NOTE — Progress Notes (Signed)
Patient Profile: 82 y/o female with h/o HTN, chronic systolic HF with prior EF of 45%, HLD, CKD and SSS s/p PPM admitted for acute on chronic CHF.  Found to have worsening LV dysfunction by echo with reduced EF of 25-30%.   Subjective: Feels much better today. Was able to ambulate around her room this am w/o exertional dyspnea. No CP.   Objective: Vital signs in last 24 hours: Temp:  [97.5 F (36.4 C)-98 F (36.7 C)] 97.5 F (36.4 C) (07/12 0515) Pulse Rate:  [69-79] 69 (07/12 0515) Resp:  [18-20] 18 (07/12 0515) BP: (126-137)/(58-76) 137/58 mmHg (07/12 0515) SpO2:  [96 %-98 %] 98 % (07/12 0515) Weight:  [101 lb (45.813 kg)] 101 lb (45.813 kg) (07/12 0515) Last BM Date: 10/21/15  Intake/Output from previous day: 07/11 0701 - 07/12 0700 In: 460 [P.O.:360; IV Piggyback:100] Out: 700 [Urine:700] Intake/Output this shift: Total I/O In: -  Out: 150 [Urine:150]  Medications Current Facility-Administered Medications  Medication Dose Route Frequency Provider Last Rate Last Dose  . 0.9 %  sodium chloride infusion  250 mL Intravenous PRN Penny Pia, MD      . acetaminophen (TYLENOL) tablet 650 mg  650 mg Oral Q4H PRN Penny Pia, MD      . aspirin chewable tablet 81 mg  81 mg Oral Daily Chrystie Nose, MD      . carvedilol (COREG) tablet 3.125 mg  3.125 mg Oral BID WC Chrystie Nose, MD   3.125 mg at 10/23/15 1704  . furosemide (LASIX) tablet 40 mg  40 mg Oral Daily Chrystie Nose, MD      . heparin injection 5,000 Units  5,000 Units Subcutaneous Q8H Penny Pia, MD   5,000 Units at 10/23/15 2306  . levothyroxine (SYNTHROID, LEVOTHROID) tablet 125 mcg  125 mcg Oral QAC breakfast Penny Pia, MD   125 mcg at 10/23/15 0829  . ondansetron (ZOFRAN) injection 4 mg  4 mg Intravenous Q6H PRN Penny Pia, MD      . sodium chloride flush (NS) 0.9 % injection 3 mL  3 mL Intravenous Q12H Penny Pia, MD   3 mL at 10/23/15 2307  . sodium chloride flush (NS) 0.9 % injection 3 mL  3  mL Intravenous PRN Penny Pia, MD        PE: General: Pleasant, NAD, elderly and frail Psych: Normal affect. Neuro: Alert and oriented X 3. Moves all extremities spontaneously. HEENT: Normal Neck: Supple without bruits or JVD. Lungs: Resp regular and unlabored, decreased BS bilaterally Heart: RRR no s3, s4, or murmurs. Abdomen: Soft, non-tender, non-distended, BS + x 4.  Extremities: No clubbing, cyanosis or edema. DP/PT/Radials 2+ and equal bilaterally.  Lab Results:   Recent Labs  10/21/15 1450  WBC 11.9*  HGB 11.8*  HCT 34.1*  PLT 251   BMET  Recent Labs  10/23/15 0008 10/23/15 0708 10/24/15 0457  NA 128* 128* 125*  K 3.3* 3.2* 4.4  CL 88* 87* 89*  CO2 32 30 26  GLUCOSE 105* 112* 102*  BUN 27* 24* 22*  CREATININE 1.29* 1.35* 1.21*  CALCIUM 8.4* 8.5* 8.4*   Cardiac Panel (last 3 results)  Recent Labs  10/21/15 1745 10/21/15 2307 10/22/15 0512  TROPONINI 0.04* 0.05* 0.05*    Studies/Results: Echocardiogram 10/22/15 Study Conclusions  - Left ventricle: The cavity size was normal. Wall thickness was  normal. Systolic function was severely reduced. The estimated  ejection fraction was in the range of 25% to 30%. Diffuse  hypokinesis. Features are consistent with a pseudonormal left  ventricular filling pattern, with concomitant abnormal relaxation  and increased filling pressure (grade 2 diastolic dysfunction). - Aortic valve: Trileaflet; mildly thickened, mildly calcified  leaflets. There was moderate regurgitation. - Pericardium, extracardiac: There was a left pleural effusion.  Impressions:  - When compared to prior echocardiogram, EF is reduced (prior 40%).   Assessment/Plan  Active Problems:   Complete heart block (HCC)   Essential hypertension   Acute exacerbation of CHF (congestive heart failure) (HCC)   1. Acute on Chronic Combined Systolic + Diastolic CHF: BNP is abnormal at 2973. Admission CXR showed  cardiomegaly with mild bibasilar interstitial edema with bilateral pleural effusions. There is atelectatic change in the left base. She has received 2 doses of IV lasix, 60 mg on 7/9 and 7/10. Good diuresis thus far, I/Os net negative 6.9L since admit. There is no significant peripheral edema. Her weight is down from 126 lb at time of last OV 08/10/15 to 101 lb today. Her breathing has improved. She has been transitioned to PO lasix, 40 mg daily. BP, renal function and K both stable. She is a bit hyponatremic with Na level at 125. Will need to monitor this closely.   2. Systolic LV Dysfunction: 2D echo shows further reduction in EF down from 45-50% previously, now down to 25-30% with diffuse hypokinesis and moderate AI. ? Etiology. She denies any recent fever, chills/ viral illnesses. She does note recent episode of tachy palpitations, however PPM interrogation yesterday demonstrated no arrhthymias. She denies any recent CP but has had exertional dyspnea (? Anginal equivalent vs CHF related dyspnea). Underlying CAD is not excluded, however given her advanced age of 82 and frailty, she is not a candidate for LHC. We will plan to treat medically. She has an allergy to ACE/ARBs.  Low dose Coreg was added yesterday and she appears to be tolerating this well. HR and BP both stable. ASA added. Low sodium diet. Continue to monitor volume status closely.   3. PPM: [employer] Device. Inserted for SSS. Followed by Dr. Johney FrameAllred. Interrogation 10/23/15 showed no arrhthymias.   4. Abnormal Troponin: low level trend, 0.04>0.05. She denies CP. Suspect abnormal troponin is likely secondary to demand ischemia from acute on chronic CHF. However, with new reduction in EF down to 25-30%, underlying CAD is not excluded. However given advanced age and frailty, she is not a cath candidate. Medical therapy is more appropriate. Continue ASA and BB.   5. Hypokalemia: K improved today and WNL after supplementation at 4.4   6.  Hypomagnesemia: improved after supplementation, now at 2.4.   7. Hyponatremia: She is a bit hyponatremic with Na level at 125. She has been treated with diuretics. Will need to monitor this closely.      LOS: 2 days    Mery Guadalupe M. Delmer IslamSimmons, PA-C 10/24/2015 7:54 AM

## 2015-10-25 DIAGNOSIS — I5043 Acute on chronic combined systolic (congestive) and diastolic (congestive) heart failure: Secondary | ICD-10-CM

## 2015-10-25 LAB — BASIC METABOLIC PANEL
ANION GAP: 8 (ref 5–15)
BUN: 27 mg/dL — AB (ref 6–20)
CO2: 29 mmol/L (ref 22–32)
Calcium: 8.7 mg/dL — ABNORMAL LOW (ref 8.9–10.3)
Chloride: 85 mmol/L — ABNORMAL LOW (ref 101–111)
Creatinine, Ser: 1.31 mg/dL — ABNORMAL HIGH (ref 0.44–1.00)
GFR calc Af Amer: 39 mL/min — ABNORMAL LOW (ref >60)
GFR, EST NON AFRICAN AMERICAN: 34 mL/min — AB (ref >60)
Glucose, Bld: 117 mg/dL — ABNORMAL HIGH (ref 65–99)
POTASSIUM: 4.5 mmol/L (ref 3.5–5.1)
SODIUM: 122 mmol/L — AB (ref 135–145)

## 2015-10-25 NOTE — Progress Notes (Signed)
Calorie Count started, informed patient acknowledged understanding. SRP, RN

## 2015-10-25 NOTE — Progress Notes (Signed)
Pharmacist Heart Failure Core Measure Documentation  Assessment: Bethany Landry has an EF documented as 25-30% on 10/22/15 by ECHO  Rationale: Heart failure patients with left ventricular systolic dysfunction (LVSD) and an EF < 40% should be prescribed an angiotensin converting enzyme inhibitor (ACEI) or angiotensin receptor blocker (ARB) at discharge unless a contraindication is documented in the medical record.  This patient is not currently on an ACEI or ARB for HF.  This note is being placed in the record in order to provide documentation that a contraindication to the use of these agents is present for this encounter.  ACE Inhibitor or Angiotensin Receptor Blocker is contraindicated (specify all that apply)  []   ACEI allergy AND ARB allergy []   Angioedema []   Moderate or severe aortic stenosis []   Hyperkalemia []   Hypotension []   Renal artery stenosis [x]   Worsening renal function, preexisting renal disease or dysfunction   Dannielle HuhZeigler, Dustin George 10/25/2015 7:30 AM

## 2015-10-25 NOTE — Care Management Important Message (Signed)
Important Message  Patient Details  Name: Bethany Landry Huhn MRN: [id number] Date of Birth: 20-Jul-1920   Medicare Important Message Given:  Yes    Haskell FlirtJamison, Meridith Romick 10/25/2015, 9:48 AMImportant Message  Patient Details  Name: Bethany Landry Salle MRN: [id number] Date of Birth: 20-Jul-1920   Medicare Important Message Given:  Yes    Haskell FlirtJamison, Carren Blakley 10/25/2015, 9:48 AM

## 2015-10-25 NOTE — Consult Note (Signed)
   [facility]   10/25/2015  Bethany Landry 1921-04-11 [id number]     [facility]. Chart reviewed. Noted discharge plan is for SNF.  [facility] are no [facility] at [facility]. If patient's post hospital needs change, please place a [facility] consult. For questions please contact:  Raiford Nobletika Anessia [place], MSN-Ed, RN,BSN [facility] Care [facility] Hospital Liaison [phone]

## 2015-10-25 NOTE — Progress Notes (Signed)
PROGRESS NOTE                                                                                                                                                                                                             Patient Demographics:    Bethany Landry, is a 82 y.o. female, DOB - Sep 01, 1920, ZOX:[id number]  Admit date - 10/21/2015   Admitting Physician Bethany Piarlando Vega, MD  Outpatient Primary MD for the patient is Bethany SchaumannIbethal J Shamleffer, MD  LOS - 3  Outpatient Specialists: Bethany Landry  Chief Complaint  Patient presents with  . Shortness of Breath       Brief Narrative   82 year old female with history of CKD stage III, essential hypertension, history of third-degree heart block status post pacemaker, chronic systolic CHF, and hypothyroidism presented with several days of progressive shortness of breath and found to have acute on chronic systolic CHF exacerbation.    Subjective:   Breathing continues to be better. No overnight issues.   Assessment  & Plan :   Principal problem Acute on chronic combined systolic and diastolic CHF (HCC) acute severe CHF with worsened EF of 25-30% on repeat echo with diffuse hypokinesis. Cannot rule out underlying CAD with ischemia given significant hypokinesis with elevated troponin.. No recent viral illness, PPM interrogated on 7/10 without any arrhythmias. Given her advanced age cardiology recommended against LHC and plan on optimal medical management. - diuresed well with IV Lasix and now switched to by mouth. Added baby aspirin and low-dose Coreg. Patient allergic to ACEi/ ARB. - strict I/O and daily weight. Has lost about 15 pounds since admission. Appears euvolemic.  Elevated troponin. Peaked at 0.05. No chest pain symptoms suspected due to demand ischemia from acute CHF. Given her worsened EF and diffuse hypokinesis cannot rule out ischemic cardiomyopathy. Again given her  advanced age cardiology recommended for optimal medical management.  Hypokalemia/hypomagnesemia Replenished daily . Now Stable.   Hyponatremia Reportedly has chronic hyponatremia. Suspect SIADH. Sodium dropped to 122. Ordered strict fluid restriction.  History of sick sinus syndrome status post PPM Interrogated on 7/11 showed noted venous. Follows with Bethany Landry.  Chronic kidney disease stage III Renal function at baseline  Generalized weakness Seen by PT and recommend skilled nursing facility.  Severe protein calorie malnutrition (HCC)  Added nutritional supplement.  Code Status : DO NOT RESUSCITATE  Family Communication  : Spoke with Bethany Landry on the phone  Disposition Plan  : SNF tomorrow if sodium improves  Barriers For Discharge : hyponatremia  Consults  :   cardiology  Procedures  : 2d echo  DVT Prophylaxis  :  Sq heparin  Lab Results  Component Value Date   PLT 251 10/21/2015    Antibiotics  :    Anti-infectives    None        Objective:   Filed Vitals:   10/24/15 0515 10/24/15 1400 10/24/15 2154 10/25/15 0515  BP: 137/58 111/58 133/74 126/71  Pulse: 69 80 73 74  Temp: 97.5 F (36.4 C) 97.6 F (36.4 C) 97.6 F (36.4 C) 97.6 F (36.4 C)  TempSrc: Oral Oral Oral Oral  Resp: Height:      Weight: 45.813 kg (101 lb)   45.042 kg (99 lb 4.8 oz)  SpO2: 98% 97% 96% 98%    Wt Readings from Last 3 Encounters:  10/25/15 45.042 kg (99 lb 4.8 oz)  09/07/15 56.246 kg (124 lb)  08/10/15 57.153 kg (126 lb)     Intake/Output Summary (Last 24 hours) at 10/25/15 1408 Last data filed at 10/25/15 1211  Gross per 24 hour  Intake    480 ml  Output      0 ml  Net    480 ml     Physical Exam  Gen: not in distress HEENT:  moist mucosa, supple neck Chest: clear b/l, no added sounds CVS: N S1&S2, no murmurs, rubs or gallop GI: soft, NT, ND, BS+ Musculoskeletal: warm, no edema     Data Review:    CBC  Recent Labs Lab 10/21/15 1450    WBC 11.9*  HGB 11.8*  HCT 34.1*  PLT 251  MCV 87.0  MCH 30.1  MCHC 34.6  RDW 13.9  LYMPHSABS 0.7  MONOABS 0.8  EOSABS 0.1  BASOSABS 0.0    Chemistries   Recent Labs Lab 10/22/15 0512 10/23/15 0008 10/23/15 0708 10/24/15 0457 10/25/15 0503  NA 131* 128* 128* 125* 122*  K 2.7* 3.3* 3.2* 4.4 4.5  CL 90* 88* 87* 89* 85*  CO2 30 32 GLUCOSE 106* 105* 112* 102* 117*  BUN 31* 27* 24* 22* 27*  CREATININE 1.21* 1.29* 1.35* 1.21* 1.31*  CALCIUM 8.7* 8.4* 8.5* 8.4* 8.7*  MG  --   --  1.6* 2.4  --    ------------------------------------------------------------------------------------------------------------------ No results for input(s): CHOL, HDL, LDLCALC, TRIG, CHOLHDL, LDLDIRECT in the last 72 hours.  No results found for: HGBA1C ------------------------------------------------------------------------------------------------------------------ No results for input(s): TSH, T4TOTAL, T3FREE, THYROIDAB in the last 72 hours.  Invalid input(s): FREET3 ------------------------------------------------------------------------------------------------------------------ No results for input(s): VITAMINB12, FOLATE, FERRITIN, TIBC, IRON, RETICCTPCT in the last 72 hours.  Coagulation profile No results for input(s): INR, PROTIME in the last 168 hours.  No results for input(s): DDIMER in the last 72 hours.  Cardiac Enzymes  Recent Labs Lab 10/21/15 1745 10/21/15 2307 10/22/15 0512  TROPONINI 0.04* 0.05* 0.05*   ------------------------------------------------------------------------------------------------------------------    Component Value Date/Time   BNP 2973.0* 10/21/2015 1450    Inpatient Medications  Scheduled Meds: . aspirin  81 mg Oral Daily  . carvedilol  3.125 mg Oral BID WC  . feeding supplement (ENSURE ENLIVE)  237 mL Oral BID BM  . furosemide  40 mg Oral Daily  . heparin  5,000 Units Subcutaneous Q8H  . levothyroxine  125 mcg Oral QAC breakfast   .  sodium chloride flush  3 mL Intravenous Q12H   Continuous Infusions:  PRN Meds:.sodium chloride, acetaminophen, ondansetron (ZOFRAN) IV, sodium chloride flush  Micro Results Recent Results (from the past 240 hour(s))  MRSA PCR Screening     Status: None   Collection Time: 10/21/15  6:20 PM  Result Value Ref Range Status   MRSA by PCR NEGATIVE NEGATIVE Final    Comment:        The GeneXpert MRSA Assay (FDA approved for NASAL specimens only), is one component of a comprehensive MRSA colonization surveillance program. It is not intended to diagnose MRSA infection nor to guide or monitor treatment for MRSA infections.     Radiology Reports Dg Chest 2 View  10/21/2015  CLINICAL DATA:  Shortness of breath and cough. EXAM: CHEST  2 VIEW COMPARISON:  May 09, 2013 FINDINGS: There is mild bibasilar interstitial edema with bilateral pleural effusions. There is atelectatic change in the left base. There is cardiomegaly. The pulmonary vascularity is within normal limits. Pacemaker leads are attached to the right atrium and right ventricle. There is postoperative change in the lower thoracic and lumbar spine regions. IMPRESSION: Findings felt to represent a degree of congestive heart failure. Left base atelectasis present. Pacemaker leads attached to the right atrium and right ventricle. Electronically Signed   By: Bretta Bang III M.D.   On: 10/21/2015 14:48    Time Spent in minutes  25   Eddie North M.D on 10/25/2015 at 2:08 PM  Between 7am to 7pm - Pager - [phone]  After 7pm go to [email or web address] - password [facility]  [facility] -  Office  [phone]

## 2015-10-25 NOTE — Progress Notes (Signed)
Nutrition Follow-up  DOCUMENTATION CODES:   Severe malnutrition in context of acute illness/injury, Underweight  INTERVENTION:  - Continue Ensure Enlive BID. - Continue to encourage PO intakes of meals and supplements. - RD will follow-up 7/14 related to Calorie Count.  NUTRITION DIAGNOSIS:   Malnutrition related to acute illness as evidenced by percent weight loss, moderate depletions of muscle mass. -ongoing  GOAL:   Patient will meet greater than or equal to 90% of their needs -minimally met since admission.   MONITOR:   PO intake, Supplement acceptance, Weight trends, Labs, I & O's  REASON FOR ASSESSMENT:   Consult Calorie Count  ASSESSMENT:   82 y.o. female with medical history significant of hypothyroidism, CK D stage III, essential hypertension, history of third-degree heart block status post pacemaker, history of CHF in the past. Who presents to the hospital complaining of several days of shortness of breath and dyspnea on exertion. The problem has been gradual in onset and has been progressively getting worse. Nothing she is aware of makes it better. Activity makes his shortness of breath worse. Persistent symptoms patient decided to come to the hospital for further evaluation and recommendations.  7/13 New consult for Calorie Count. Per chart review, pt ate 50% of breakfast and 75% of lunch yesterday and 35% of breakfast this AM. Pt reports that for breakfast she had cereal and fruit which is a typical breakfast for her. She states that at baseline she does not have much of an appetite but eats because she feels that she should rather than being interested in eating. Pt states that her husband sometimes purchases nutrition supplements such as Ensure; she drinks them but not on a consistent basis.   Pt denies any chewing difficulties. She states that foods and liquids sometimes feel that they are getting stuck in her throat and that this sometimes causes her to cough. Per  chart review, weight is down 2 lbs since yesterday with ongoing diuresis. Pt to go to SNF when hyponatremia is resolved; will monitor. Fluid restriction now in place: 1L/day.  Physical assessment shows mild to moderate muscle and fat wasting to upper body; lower body not assessed at this time.   RD will follow-up tomorrow related to Calorie Count. Envelope has already been hung by RN.  Medications reviewed; 40 mg oral Lasix/day, 125 mcg Synthroid/day, PRN Zofran.  Labs reviewed; Na: 122 mmol/L, Cl: 85 mmol/L, BUN: 27 mg/dL, creatinine: 1.31 mg/dL, Ca: 8.7 mg/dL, GFR: 34 mL/min.   7/12 - Pt seen for underweight BMI.  - Per chart review, pt has mainly been consuming 50% at meals since admission.  - Pt sleeping at this time with no family/visitors present; did not feel it was necessary to awake pt at this time.  - Lunch tray on bedside table with 50-75% completion.  - Did not perform physical assessment with respect to pt's comfort at this time but noted at least moderate muscle wasting around clavicle and shoulder area.  - Per chart review, pt lost 23 lbs (18.5% body weight) within the past 2 months which is significant for time frame.  - This may be partly fluid related as weight is down 14 lbs (12% body weight) since admission on 10/21/15 alone; will continue to monitor weight trends. - Will order Ensure Enlive BID to supplement and change as needed.    Diet Order:  Diet Heart Room service appropriate?: Yes; Fluid consistency:: Thin; Fluid restriction:: Other (see comments)  Skin:  Reviewed, no issues  Last BM:  7/13  Height:   Ht Readings from Last 1 Encounters:  10/21/15 '5\' 7"'$  (1.702 m)    Weight:   Wt Readings from Last 1 Encounters:  10/25/15 99 lb 4.8 oz (45.042 kg)    Ideal Body Weight:  61.36 kg (kg)  BMI:  Body mass index is 15.55 kg/(m^2).  Estimated Nutritional Needs:   Kcal:  1100-1300  Protein:  45-55 grams  Fluid:  >/= 1 L/day  EDUCATION NEEDS:   No  education needs identified at this time     Jarome Matin, MS, RD, LDN Inpatient Clinical Dietitian Pager # [phone] After hours/weekend pager # [phone]

## 2015-10-26 DIAGNOSIS — I5022 Chronic systolic (congestive) heart failure: Secondary | ICD-10-CM | POA: Diagnosis present

## 2015-10-26 DIAGNOSIS — N183 Chronic kidney disease, stage 3 (moderate): Secondary | ICD-10-CM

## 2015-10-26 LAB — BASIC METABOLIC PANEL
Anion gap: 8 (ref 5–15)
BUN: 23 mg/dL — AB (ref 6–20)
CHLORIDE: 88 mmol/L — AB (ref 101–111)
CO2: 30 mmol/L (ref 22–32)
Calcium: 8.7 mg/dL — ABNORMAL LOW (ref 8.9–10.3)
Creatinine, Ser: 1.24 mg/dL — ABNORMAL HIGH (ref 0.44–1.00)
GFR calc Af Amer: 42 mL/min — ABNORMAL LOW (ref >60)
GFR calc non Af Amer: 36 mL/min — ABNORMAL LOW (ref >60)
Glucose, Bld: 99 mg/dL (ref 65–99)
POTASSIUM: 4 mmol/L (ref 3.5–5.1)
SODIUM: 126 mmol/L — AB (ref 135–145)

## 2015-10-26 MED ORDER — CARVEDILOL 3.125 MG PO TABS: 3.1250 mg | ORAL_TABLET | Freq: Two times a day (BID) | ORAL | Status: DC

## 2015-10-26 MED ORDER — ENSURE ENLIVE PO LIQD: 237.0000 mL | Freq: Two times a day (BID) | ORAL | Status: DC

## 2015-10-26 MED ORDER — ASPIRIN 81 MG PO CHEW: 81.0000 mg | CHEWABLE_TABLET | Freq: Every day | ORAL | Status: DC

## 2015-10-26 MED ORDER — FUROSEMIDE 40 MG PO TABS: 40.0000 mg | ORAL_TABLET | Freq: Every day | ORAL | Status: DC

## 2015-10-26 NOTE — Care Management Note (Signed)
Case Management Note  Patient Details  Name: Bethany Landry MRN: [id number] Date of Birth: [birth year]  Subjective/Objective:                    Action/Plan:d/c SNF.   Expected Discharge Date:                Expected Discharge Plan:  Skilled Nursing Facility  In-House Referral:  Clinical Social Work  Discharge planning Services  CM Consult  Post Acute Care Choice:   (Lives @ Independent Living) Choice offered to:     DME Arranged:    DME Agency:     HH Arranged:    HH Agency:     Status of Service:  Completed, signed off  If discussed at [employer] Meetings, dates discussed:    Additional Comments:  Lanier ClamMahabir, Jordanny Waddington, RN 10/26/2015, 11:41 AM

## 2015-10-26 NOTE — Progress Notes (Signed)
NUTRITION NOTE  Full follow-up note done yesterday with associated note at 1151. Calorie Count order previously placed and was scheduled to end this AM as pt to d/c today; d/c order and summary currently in place.  Pt with lunch tray in front of her with a few bites of potatoes consumed and ~50% of Ensure. Fish was uncut and pt declined offer for RD to cut this item and declined offer for something different to be ordered for her.  Meal tickets retained from dinner last night and breakfast this AM. For dinner pt consumed 10% of pork loin, 10% of baked potato, 10% of glazed carrots, and 100% of iced tea. For breakfast she had 100% of 4 oz orange juice, 50% of coffee, and 100% of oatmeal.   Pt not meeting estimated needs at this time. D/c summary indicates pt to be on Heart Healthy diet with 1200 mL fluid restriction. Also states that pt likely has chronic hyponatremia. Serum Na up to 126 mmol/L from 122 mmol/L yesterday. Ensure Enlive currently ordered BID; summary indicates plan to continue oral nutrition supplements at facility [facility]   Estimated Nutritional Needs:  Kcal: 1100-1300 Protein: 45-55 grams Fluid: >/= 1 L/day   Trenton GammonJessica Genessa Beman, MS, RD, LDN Inpatient Clinical Dietitian Pager # [phone] After hours/weekend pager # [phone]

## 2015-10-26 NOTE — Progress Notes (Signed)
Patient is set to [facility] today. Patient & son, Tammy SoursGreg made aware. Discharge packet given to RN, Susie. [facility] called for transport.     Lincoln MaxinKelly Kenyatta Keidel, LCSW [facility] Clinical Social Worker cell #: [phone]

## 2015-10-26 NOTE — Clinical Social Work Placement (Signed)
Patient's son has accepted bed [facility] & per Dr. Gonzella Lexhungel is ready to discharge today. Son to complete admission paperwork with Eber Jonesarolyn at [facility] & patient can discharge once that is done & discharge summary is completed.    Lincoln MaxinKelly Airel Magadan, LCSW [facility] Clinical Social Worker cell #: [phone]    CLINICAL SOCIAL WORK PLACEMENT  NOTE  Date:  10/26/2015  Patient Details  Name: Bethany BuffDorothy Schlicker MRN: [id number] Date of Birth: 1920/04/24  Clinical Social Work is seeking post-discharge placement for this patient at the Skilled  Nursing Facility level of care (*CSW will initial, date and re-position this form in  chart as items are completed):  Yes   Patient/family provided with [facility] Clinical Social Work Department's list of facilities offering this level of care within the geographic area requested by the patient (or if unable, by the patient's family).  Yes   Patient/family informed of their freedom to choose among providers that offer the needed level of care, that participate in Medicare, Medicaid or managed care program needed by the patient, have an available bed and are willing to accept the patient.  Yes   Patient/family informed of [facility]'s ownership interest in [facility] and [facility], as well as of the fact that they are under no obligation to receive care at these facilities.  PASRR submitted to EDS on 10/23/15     PASRR number received on 10/23/15     Existing PASRR number confirmed on       FL2 transmitted to all facilities in geographic area requested by pt/family on 10/23/15     FL2 transmitted to all facilities within larger geographic area on       Patient informed that his/her managed care company has contracts with or will negotiate with certain facilities, including the following:        Yes   Patient/family informed of bed offers received.  [facility] bed [facility]     Physician recommends  and patient chooses bed at      [facility] 10[facility]26/15.  Patient to be transferred to facility by [facility]     Patient family notified on 10/26/15 of transfer.  Name of family member notified:  patient's son, Bethany Landry via phone     PHYSICIAN       Additional Comment:    _______________________________________________ Arlyss RepressHarrison, Cynthya Yam F, LCSW 10/26/2015, 9:59 AM

## 2015-10-26 NOTE — Discharge Summary (Signed)
Physician Discharge Summary  Bethany Landry OZH:[id number] DOB: 12/18/1920 DOA: 10/21/2015  PCP: Clelia Schaumann, MD  Admit date: 10/21/2015 Discharge date: 10/26/2015  Admitted From: Independent living Disposition:  Skilled nursing facility  Recommendations for Outpatient Follow-up:  1. Follow up with M.D. at rehabilitation in 1 week. Please monitor BMET including magnesium early next week. 2. Follow-up with cardiology in 4 weeks 3.   Home Health: None Equipment/Devices: None  Discharge Condition: Stable CODE STATUS: DO NOT RESUSCITATE Diet recommendation: Heart Healthy with fluid restriction of 1200 cc/ day    Discharge Diagnoses:  Principal Problem:   Acute on chronic combined systolic and diastolic heart failure (HCC)   Active Problems:   Essential hypertension   Cardiac pacemaker   CKD (chronic kidney disease), stage III   Protein-calorie malnutrition, severe   Hyponatremia  Brief narrative/history of present illness 82 year old female with history of CKD stage III, essential hypertension, history of third-degree heart block status post pacemaker, chronic systolic CHF, and hypothyroidism presented with several days of progressive shortness of breath and found to have acute on chronic systolic CHF exacerbation.  Hospital course Principal problem Acute on chronic combined systolic and diastolic CHF (HCC)  worsened EF of 25-30% on repeat echo with diffuse hypokinesis. Cannot rule out underlying CAD with ischemia given significant hypokinesis with elevated troponin.. No recent viral illness, PPM interrogated on 7/10 without any arrhythmias. Given her advanced age cardiology recommended against LHC and plan on optimal medical management. - diuresed well with IV Lasix and now switched to by mouth. Added baby aspirin and low-dose Coreg. Patient allergic to ACEi/ ARB. -Needs strict I/O and daily weight. lost about 15 pounds since admission. Appears euvolemic.  Elevated  troponin. Peaked at 0.05. No chest pain symptoms suspected due to demand ischemia from acute CHF. Given her worsened EF and diffuse hypokinesis cannot rule out ischemic cardiomyopathy. Again given her advanced age cardiology recommended for optimal medical management.  Hypokalemia/hypomagnesemia Replenished daily . Now Stable. Needs to be monitored at the facility.  Hyponatremia Reportedly has chronic hyponatremia. Suspect SIADH. Will discontinue HCTZ. Needs strict fluid restriction.  History of sick sinus syndrome status post PPM Pacemaker Interrogated on 7/11 showed no arrhythmias. Follows with Dr. Johney Frame.  Chronic kidney disease stage III Renal function at baseline  Generalized weakness Seen by PT and recommend skilled nursing facility.  Severe protein calorie malnutrition (HCC)  Added nutritional supplement.    Family Communication : Spoke with Son on the phone  Disposition Plan : SNF    Consults :  cardiology  Procedures : 2d echo  Discharge Instructions     Medication List    STOP taking these medications        hydrochlorothiazide 12.5 MG capsule  Commonly known as:  MICROZIDE     metoprolol 50 MG tablet  Commonly known as:  LOPRESSOR      TAKE these medications        aspirin 81 MG chewable tablet  Chew 1 tablet (81 mg total) by mouth daily.     carvedilol 3.125 MG tablet  Commonly known as:  COREG  Take 1 tablet (3.125 mg total) by mouth 2 (two) times daily with a meal.     feeding supplement (ENSURE ENLIVE) Liqd  Take 237 mLs by mouth 2 (two) times daily between meals.     furosemide 40 MG tablet  Commonly known as:  LASIX  Take 1 tablet (40 mg total) by mouth daily.     levothyroxine 125 MCG tablet  Commonly known as:  SYNTHROID, LEVOTHROID  Take 1 TABLET BY MOUTH ONCE DAILY           Follow-up Information    Please follow up.   Why:  MD at SNF in 1 week      Follow up with Chrystie NoseKenneth C Hilty, MD. Schedule an appointment as  soon as possible for a visit in 4 weeks.   Specialty:  Cardiology   Contact information:   [address] [place] [address] [phone]      Allergies  Allergen Reactions  . Norvasc [Amlodipine Besylate] Swelling    Swelling in ankles  . Quinapril Hcl Swelling  . Ace Inhibitors Swelling and Cough        Procedures/Studies: Dg Chest 2 View  10/21/2015  CLINICAL DATA:  Shortness of breath and cough. EXAM: CHEST  2 VIEW COMPARISON:  May 09, 2013 FINDINGS: There is mild bibasilar interstitial edema with bilateral pleural effusions. There is atelectatic change in the left base. There is cardiomegaly. The pulmonary vascularity is within normal limits. Pacemaker leads are attached to the right atrium and right ventricle. There is postoperative change in the lower thoracic and lumbar spine regions. IMPRESSION: Findings felt to represent a degree of congestive heart failure. Left base atelectasis present. Pacemaker leads attached to the right atrium and right ventricle. Electronically Signed   By: Bretta BangWilliam  Woodruff III M.D.   On: 10/21/2015 14:48    2-D echo (10/22/2015) Study Conclusions  - Left ventricle: The cavity size was normal. Wall thickness was  normal. Systolic function was severely reduced. The estimated  ejection fraction was in the range of 25% to 30%. Diffuse  hypokinesis. Features are consistent with a pseudonormal left  ventricular filling pattern, with concomitant abnormal relaxation  and increased filling pressure (grade 2 diastolic dysfunction). - Aortic valve: Trileaflet; mildly thickened, mildly calcified  leaflets. There was moderate regurgitation. - Pericardium, extracardiac: There was a left pleural effusion. Impressions: - When compared to prior echocardiogram, EF is reduced (prior 40%).   Subjective: Patient denies any shortness of breath. No overnight issues.  Discharge Exam: Filed Vitals:   10/25/15 2114 10/26/15 0508  BP:  126/62 144/74  Pulse: 73 81  Temp: 97.7 F (36.5 C) 97.6 F (36.4 C)  Resp: 20 18   Filed Vitals:   10/25/15 0515 10/25/15 1424 10/25/15 2114 10/26/15 0508  BP: 126/71 130/57 126/62 144/74  Pulse: 74 77 73 81  Temp: 97.6 F (36.4 C) 97.1 F (36.2 C) 97.7 F (36.5 C) 97.6 F (36.4 C)  TempSrc: Oral Oral Axillary Oral  Resp: 18 24 20 18   Height:      Weight: 45.042 kg (99 lb 4.8 oz)   51.801 kg (114 lb 3.2 oz)  SpO2: 98% 97% 98% 96%     Gen: not in distress HEENT: moist mucosa, supple neck Chest: clear b/l, no added sounds CVS: N S1&S2, no murmurs, rubs or gallop GI: soft, NT, ND, BS+ Musculoskeletal: warm, no edema CNS: Alert and oriented  The results of significant diagnostics from this hospitalization (including imaging, microbiology, ancillary and laboratory) are listed below for reference.     Microbiology: Recent Results (from the past 240 hour(s))  MRSA PCR Screening     Status: None   Collection Time: 10/21/15  6:20 PM  Result Value Ref Range Status   MRSA by PCR NEGATIVE NEGATIVE Final    Comment:        The GeneXpert MRSA Assay (FDA approved for  NASAL specimens only), is one component of a comprehensive MRSA colonization surveillance program. It is not intended to diagnose MRSA infection nor to guide or monitor treatment for MRSA infections.      Labs: BNP (last 3 results)  Recent Labs  10/21/15 1450  BNP 2973.0*   Basic Metabolic Panel:  Recent Labs Lab 10/23/15 0008 10/23/15 0708 10/24/15 0457 10/25/15 0503 10/26/15 0455  NA 128* 128* 125* 122* 126*  K 3.3* 3.2* 4.4 4.5 4.0  CL 88* 87* 89* 85* 88*  CO2 32 GLUCOSE 105* 112* 102* 117* 99  BUN 27* 24* 22* 27* 23*  CREATININE 1.29* 1.35* 1.21* 1.31* 1.24*  CALCIUM 8.4* 8.5* 8.4* 8.7* 8.7*  MG  --  1.6* 2.4  --   --    Liver Function Tests: No results for input(s): AST, ALT, ALKPHOS, BILITOT, PROT, ALBUMIN in the last 168 hours. No results for input(s): LIPASE,  AMYLASE in the last 168 hours. No results for input(s): AMMONIA in the last 168 hours. CBC:  Recent Labs Lab 10/21/15 1450  WBC 11.9*  NEUTROABS 10.4*  HGB 11.8*  HCT 34.1*  MCV 87.0  PLT 251   Cardiac Enzymes:  Recent Labs Lab 10/21/15 1450 10/21/15 1745 10/21/15 2307 10/22/15 0512  TROPONINI 0.04* 0.04* 0.05* 0.05*   BNP: Invalid input(s): POCBNP CBG: No results for input(s): GLUCAP in the last 168 hours. D-Dimer No results for input(s): DDIMER in the last 72 hours. Hgb A1c No results for input(s): HGBA1C in the last 72 hours. Lipid Profile No results for input(s): CHOL, HDL, LDLCALC, TRIG, CHOLHDL, LDLDIRECT in the last 72 hours. Thyroid function studies No results for input(s): TSH, T4TOTAL, T3FREE, THYROIDAB in the last 72 hours.  Invalid input(s): FREET3 Anemia work up No results for input(s): VITAMINB12, FOLATE, FERRITIN, TIBC, IRON, RETICCTPCT in the last 72 hours. Urinalysis    Component Value Date/Time   COLORURINE YELLOW 10/21/2015 1957   APPEARANCEUR CLEAR 10/21/2015 1957   LABSPEC 1.013 10/21/2015 1957   PHURINE 6.0 10/21/2015 1957   GLUCOSEU NEGATIVE 10/21/2015 1957   HGBUR TRACE* 10/21/2015 1957   BILIRUBINUR NEGATIVE 10/21/2015 1957   KETONESUR NEGATIVE 10/21/2015 1957   PROTEINUR 30* 10/21/2015 1957   UROBILINOGEN 0.2 10/12/2013 2057   NITRITE NEGATIVE 10/21/2015 1957   LEUKOCYTESUR NEGATIVE 10/21/2015 1957   Sepsis Labs Invalid input(s): PROCALCITONIN,  WBC,  LACTICIDVEN Microbiology Recent Results (from the past 240 hour(s))  MRSA PCR Screening     Status: None   Collection Time: 10/21/15  6:20 PM  Result Value Ref Range Status   MRSA by PCR NEGATIVE NEGATIVE Final    Comment:        The GeneXpert MRSA Assay (FDA approved for NASAL specimens only), is one component of a comprehensive MRSA colonization surveillance program. It is not intended to diagnose MRSA infection nor to guide or monitor treatment for MRSA infections.       Time coordinating discharge: Over 30 minutes  SIGNED:   Eddie North, MD  [facility] 10/26/2015, 11:08 AM Pager   If 7PM-7AM, please contact night-coverage [email or web address] Password [email or web address]

## 2015-10-29 ENCOUNTER — Non-Acute Institutional Stay (SKILLED_NURSING_FACILITY): Payer: Medicare Other | Admitting: Internal Medicine

## 2015-10-29 ENCOUNTER — Encounter: Payer: Self-pay | Admitting: Internal Medicine

## 2015-10-29 DIAGNOSIS — E46 Unspecified protein-calorie malnutrition: Secondary | ICD-10-CM

## 2015-10-29 DIAGNOSIS — E871 Hypo-osmolality and hyponatremia: Secondary | ICD-10-CM | POA: Diagnosis not present

## 2015-10-29 DIAGNOSIS — I10 Essential (primary) hypertension: Secondary | ICD-10-CM | POA: Diagnosis not present

## 2015-10-29 DIAGNOSIS — D638 Anemia in other chronic diseases classified elsewhere: Secondary | ICD-10-CM

## 2015-10-29 DIAGNOSIS — R5381 Other malaise: Secondary | ICD-10-CM

## 2015-10-29 DIAGNOSIS — N183 Chronic kidney disease, stage 3 unspecified: Secondary | ICD-10-CM

## 2015-10-29 DIAGNOSIS — I5042 Chronic combined systolic (congestive) and diastolic (congestive) heart failure: Secondary | ICD-10-CM

## 2015-10-29 DIAGNOSIS — R4189 Other symptoms and signs involving cognitive functions and awareness: Secondary | ICD-10-CM | POA: Diagnosis not present

## 2015-10-29 DIAGNOSIS — D72829 Elevated white blood cell count, unspecified: Secondary | ICD-10-CM | POA: Diagnosis not present

## 2015-10-29 DIAGNOSIS — E038 Other specified hypothyroidism: Secondary | ICD-10-CM

## 2015-10-29 DIAGNOSIS — Z950 Presence of cardiac pacemaker: Secondary | ICD-10-CM

## 2015-10-29 DIAGNOSIS — R1310 Dysphagia, unspecified: Secondary | ICD-10-CM

## 2015-10-29 NOTE — Progress Notes (Signed)
LOCATION: [facility]   PCP: Clelia Schaumann, MD   Code Status: DNR  Goals of care: Advanced Directive information Advanced Directives 10/29/2015  Does patient have an advance directive? Yes  Type of Advance Directive Out of facility DNR (pink MOST or yellow form)  Does patient want to make changes to advanced directive? No - Patient declined  Copy of advanced directive(s) in chart? Yes       Extended Emergency Contact Information Primary Emergency Contact: Massenburg,Kristie  [place] Home Phone: [phone] Relation: Daughter Secondary Emergency Contact: Kloc,Gregory Address: [address]          [place], [place] Home Phone: [phone] Mobile Phone: [phone] Relation: Son   Allergies  Allergen Reactions  . Norvasc [Amlodipine Besylate] Swelling    Swelling in ankles  . Quinapril Hcl Swelling  . Ace Inhibitors Swelling and Cough    Chief Complaint  Patient presents with  . New Admit To SNF    New Admission     HPI:  Patient is a 82 y.o. female seen today for short term rehabilitation post hospital admission from 10/21/15-10/26/15 with acute on chronic CHF exacerbation. She required iv diuresis and was started on aspirin and coreg. She was seen by cardiology and medical management was recommended. She had EF of 25-30%. Her pacemaker was interrogated and no arrhythmia was noted. She is seen in her room today. She has PMH of HTN, CKD 3 and is s/p pacemaker.   Review of Systems:  Constitutional: Negative for fever, chills, diaphoresis. Feels weak and tired.  HENT: Negative for headache, congestion, nasal discharge, sore throat. Positive for difficulty swallowing.   Eyes: Negative for blurred vision, double vision and discharge.  Respiratory: Negative for cough, shortness of breath and wheezing.  her breathing has improved.  Cardiovascular: Negative for chest pain, palpitations, leg swelling.  Gastrointestinal:  Negative for heartburn, nausea, vomiting, abdominal pain, constipation. Last bowel movement was yesterday.  Genitourinary: Negative for dysuria and flank pain.  Musculoskeletal: Negative for back pain, fall in the facility.  Skin: Negative for itching, rash.  Neurological: Negative for dizziness. Psychiatric/Behavioral: Negative for depression   Past Medical History  Diagnosis Date  . Osteoporosis   . Benign hypertension   . Hyperlipidemia   . Osteoarthritis   . Insomnia   . Hypothyroidism   . Complete heart block (HCC)   . Mastoiditis     hearing loss, s/p mastoiectomy, right ear  . DOE (dyspnea on exertion)   . CKD (chronic kidney disease), stage III     moderate  . Sinoatrial node dysfunction (HCC)   . Fungal infection     recurrent   Past Surgical History  Procedure Laterality Date  . Mastoidectomy Right     Tumor removal and residual recurrent fungal infection. Dr. Pollyann Kennedy  . Thyroidectomy    . Back surgery    . Laparotomy      Ex lap for abdominal pain (ischemic colitis)  . Insert / replace / remove pacemaker  11/26/09    [employer], DDD PM  . Cataract extraction     Social History:   reports that she quit smoking about 33 years ago. Her smoking use included Cigarettes. She does not have any smokeless tobacco history on file. She reports that she drinks alcohol. She reports that she does not use illicit drugs.  Family History  Problem Relation Age of Onset  . CAD Brother   . Cerebral aneurysm Brother   .  Heart attack Mother     Medications:   Medication List       This list is accurate as of: 10/29/15 11:06 AM.  Always use your most recent med list.               aspirin 81 MG chewable tablet  Chew 1 tablet (81 mg total) by mouth daily.     carvedilol 3.125 MG tablet  Commonly known as:  COREG  Take 1 tablet (3.125 mg total) by mouth 2 (two) times daily with a meal.     feeding supplement (ENSURE ENLIVE) Liqd  Take 237 mLs by mouth 2 (two) times  daily between meals.     furosemide 40 MG tablet  Commonly known as:  LASIX  Take 1 tablet (40 mg total) by mouth daily.     levothyroxine 125 MCG tablet  Commonly known as:  SYNTHROID, LEVOTHROID  Take 1 TABLET BY MOUTH ONCE DAILY        Immunizations: Immunization History  Administered Date(s) Administered  . Influenza-Unspecified 01/12/2014     Physical Exam: Filed Vitals:   10/29/15 1059  BP: 118/78  Pulse: 70  Temp: 97.8 F (36.6 C)  TempSrc: Oral  Resp: 19  Height:  (1.702 m)  Weight: 122 lb (55.339 kg)  SpO2: 96%   Body mass index is 19.1 kg/(m^2).  General- elderly female, thin built and frail, in no acute distress Head- normocephalic, atraumatic Nose- no nasal discharge Throat- moist mucus membrane Eyes- PERRLA, EOMI, no pallor, no icterus, no discharge Neck- no cervical lymphadenopathy Cardiovascular- normal s1,s2, + murmur, trace leg edema Respiratory- bilateral clear to auscultation, no wheeze, no rhonchi, no crackles, no use of accessory muscles Abdomen- bowel sounds present, soft, non tender Musculoskeletal- able to move all 4 extremities, generalized weakness, has arthritis changes to her fingers Neurological- alert and oriented to person only Skin- warm and dry Psychiatry- normal mood and affect    Labs reviewed: Basic Metabolic Panel:  Recent Labs  16/10/96 0708 10/24/15 0457 10/25/15 0503 10/26/15 0455  NA 128* 125* 122* 126*  K 3.2* 4.4 4.5 4.0  CL 87* 89* 85* 88*  CO2 GLUCOSE 112* 102* 117* 99  BUN 24* 22* 27* 23*  CREATININE 1.35* 1.21* 1.31* 1.24*  CALCIUM 8.5* 8.4* 8.7* 8.7*  MG 1.6* 2.4  --   --    CBC:  Recent Labs  05/04/15 1602 05/05/15 0305 10/21/15 1450  WBC 11.3* 10.4 11.9*  NEUTROABS  --   --  10.4*  HGB 11.8* 11.4* 11.8*  HCT 34.1* 33.0* 34.1*  MCV 87.4 87.8 87.0  PLT 305 284 251   Cardiac Enzymes:  Recent Labs  10/21/15 1745 10/21/15 2307 10/22/15 0512  TROPONINI 0.04* 0.05*  0.05*   BNP: Invalid input(s): POCBNP CBG:  Recent Labs  05/05/15 0619  GLUCAP 99    Radiological Exams: Dg Chest 2 View  10/21/2015  CLINICAL DATA:  Shortness of breath and cough. EXAM: CHEST  2 VIEW COMPARISON:  May 09, 2013 FINDINGS: There is mild bibasilar interstitial edema with bilateral pleural effusions. There is atelectatic change in the left base. There is cardiomegaly. The pulmonary vascularity is within normal limits. Pacemaker leads are attached to the right atrium and right ventricle. There is postoperative change in the lower thoracic and lumbar spine regions. IMPRESSION: Findings felt to represent a degree of congestive heart failure. Left base atelectasis present. Pacemaker leads attached to the right atrium and right ventricle.  Electronically Signed   By: Bretta BangWilliam  Woodruff III M.D.   On: 10/21/2015 14:48    Assessment/Plan  Physical deconditioning With generalized weakness. Will have her work with physical therapy and occupational therapy team to help with gait training and muscle strengthening exercises.fall precautions. Skin care. Encourage to be out of bed.   Chronic systolic and diastolic chf Monitor daily weight. Continue lasix 40 mg daily, coreg 3.125 mg bid and aspirin 81 mg daily. Check bmp  Protein calorie malnutrition Get RD consult. Monitor weight. Continue feeding supplement  Dysphagia Get SLP to evaluate, aspiration precautions  Cognitive impairment Get BIMS done. Check tsh with free t4. Continue supportive care and to work with PT, OT and SLP.   hypothryoidism Lab Results  Component Value Date   TSH 6.622* 05/04/2015   Check tsh and free t4, continue synthroid 125 mcg daily   Hyponatremia Monitor bmp, hydration to be maintained  Hypomagnesemia Check mg level. If low, consider magnesium supplement  Leukocytosis Afebrile, monitor wbc and temp curve  Cardiac pacemaker Interrogated recently and arrhythmia ruled out.   Anemia of  chronic disease Monitor cbc  ckd stage 3 Check bmp. Avoid NSAIDs.  HTN Monitor bp, continue coreg and check bmp    Goals of care: short term rehabilitation   Labs/tests ordered: cbc, cmp, tsh   Family/ staff Communication: reviewed care plan with patient and nursing supervisor    Oneal GroutMAHIMA Maciel Kegg, MD Internal Medicine [facility] [address] [place], [place] [address] Cell Phone (Monday-Friday 8 am - 5 pm): [phone] On Call: [phone] and follow prompts after 5 pm and on weekends Office Phone: [phone] Office Fax: [phone]

## 2015-10-30 LAB — HEPATIC FUNCTION PANEL
ALK PHOS: 56 U/L (ref 25–125)
ALT: 22 U/L (ref 7–35)
AST: 27 U/L (ref 13–35)
Bilirubin, Total: 0.6 mg/dL

## 2015-10-30 LAB — CBC AND DIFFERENTIAL
HCT: 38 % (ref 36–46)
HEMOGLOBIN: 12.3 g/dL (ref 12.0–16.0)
Platelets: 249 10*3/uL (ref 150–399)
WBC: 7.8 10^3/mL

## 2015-10-30 LAB — BASIC METABOLIC PANEL
BUN: 22 mg/dL — AB (ref 4–21)
CREATININE: 1.1 mg/dL (ref 0.5–1.1)
Glucose: 105 mg/dL
POTASSIUM: 3.7 mmol/L (ref 3.4–5.3)
Sodium: 133 mmol/L — AB (ref 137–147)

## 2015-11-12 ENCOUNTER — Non-Acute Institutional Stay (SKILLED_NURSING_FACILITY): Payer: Medicare Other | Admitting: Family

## 2015-11-12 ENCOUNTER — Encounter: Payer: Self-pay | Admitting: Family

## 2015-11-12 DIAGNOSIS — E43 Unspecified severe protein-calorie malnutrition: Secondary | ICD-10-CM | POA: Diagnosis not present

## 2015-11-12 DIAGNOSIS — N183 Chronic kidney disease, stage 3 unspecified: Secondary | ICD-10-CM

## 2015-11-12 DIAGNOSIS — E871 Hypo-osmolality and hyponatremia: Secondary | ICD-10-CM

## 2015-11-12 DIAGNOSIS — R2681 Unsteadiness on feet: Secondary | ICD-10-CM | POA: Diagnosis not present

## 2015-11-12 DIAGNOSIS — R531 Weakness: Secondary | ICD-10-CM

## 2015-11-12 DIAGNOSIS — I5043 Acute on chronic combined systolic (congestive) and diastolic (congestive) heart failure: Secondary | ICD-10-CM | POA: Diagnosis not present

## 2015-11-12 DIAGNOSIS — R1310 Dysphagia, unspecified: Secondary | ICD-10-CM

## 2015-11-12 DIAGNOSIS — I10 Essential (primary) hypertension: Secondary | ICD-10-CM | POA: Diagnosis not present

## 2015-11-12 DIAGNOSIS — E039 Hypothyroidism, unspecified: Secondary | ICD-10-CM

## 2015-11-12 NOTE — Progress Notes (Signed)
Patient ID: Bethany Landry, female   DOB: January 26, 1921, 82 y.o.   MRN: [id number]    Location:  [facility] Room Number: 1204-P Place of Service:  SNF (31)  Provider: Richarda Blade, FNP-C   PCP: Clelia Schaumann, MD Patient Care Team: Tommy Rainwater, MD as PCP - General (Internal Medicine)  Extended Emergency Contact Information Primary Emergency Contact: Dreama Saa of [place] Home Phone: [phone] Relation: Daughter Secondary Emergency Contact: Held,Gregory Address: [address]          [place], [place] Home Phone: [phone] Mobile Phone: [phone] Relation: Son  Code Status: DNR Goals of care:  Advanced Directive information Advanced Directives 11/12/2015  Does patient have an advance directive? Yes  Type of Advance Directive Out of facility DNR (pink MOST or yellow form)  Does patient want to make changes to advanced directive? No - Patient declined  Copy of advanced directive(s) in chart? Yes     Allergies  Allergen Reactions  . Norvasc [Amlodipine Besylate] Swelling    Swelling in ankles  . Quinapril Hcl Swelling  . Ace Inhibitors Swelling and Cough    Chief Complaint  Patient presents with  . Discharge Note    Discharge from Facility    HPI:  82 y.o. female  Seen today at [facility] for discharge to [facility]. She was here for short term rehabilitation post hospital admission from 10/21/15-10/26/15 with acute on chronic CHF exacerbation. She required iv diuresis and was started on aspirin and coreg. She was seen by cardiology and medical management was recommended. She had EF of 25-30%. Her pacemaker was interrogated and no arrhythmia was noted. She has a medical history of HTN, CKD, CHF, OA among other conditions. She is seen in her room today. She denies any acute issues this visit. She has worked well with  PT/OT now stable for to  [facility]. She will be discharged to [facility] with Home health PT/OT to continue with ROM, Exercise, Gait stability and muscle strengthening. She will require DME: a standard WC with Cushion, anti tippers, extended brake handles, removable elevating leg rests  to enable her to maintain current level of independence.Home health services will be arranged by facility social worker prior to discharge. Prescription medication will be written x 1 month then patient to follow up with PCP in 1-2 weeks. Facility staff report no new concerns.      Past Medical History:  Diagnosis Date  . Benign hypertension   . CKD (chronic kidney disease), stage III    moderate  . Complete heart block (HCC)   . DOE (dyspnea on exertion)   . Fungal infection    recurrent  . Hyperlipidemia   . Hypothyroidism   . Insomnia   . Mastoiditis    hearing loss, s/p mastoiectomy, right ear  . Osteoarthritis   . Osteoporosis   . Sinoatrial node dysfunction (HCC)     Past Surgical History:  Procedure Laterality Date  . BACK SURGERY    . CATARACT EXTRACTION    . INSERT / REPLACE / REMOVE PACEMAKER  11/26/09   St. Jude, DDD PM  . LAPAROTOMY     Ex lap for abdominal pain (ischemic colitis)  . MASTOIDECTOMY Right    Tumor removal and residual recurrent fungal infection. Dr. Pollyann Kennedy  . THYROIDECTOMY        reports that she quit smoking about 33 years ago. Her smoking use  included Cigarettes. She does not have any smokeless tobacco history on file. She reports that she drinks alcohol. She reports that she does not use drugs. Social History   Social History  . Marital status: Divorced    Spouse name: N/A  . Number of children: N/A  . Years of education: N/A   Occupational History  . Not on file.   Social History Main Topics  . Smoking status: Former Smoker    Types: Cigarettes    Quit date: 04/14/1982  . Smokeless tobacco: Not on file  . Alcohol use Yes     Comment: wine, occasionally  . Drug use:  No  . Sexual activity: Not on file   Other Topics Concern  . Not on file   Social History Narrative  . No narrative on file      Allergies  Allergen Reactions  . Norvasc [Amlodipine Besylate] Swelling    Swelling in ankles  . Quinapril Hcl Swelling  . Ace Inhibitors Swelling and Cough    Pertinent  Health Maintenance Due  Topic Date Due  . DEXA SCAN  04/24/1987  . PNA vac Low Risk Adult (1 of 2 - PCV13) 04/24/1987  . INFLUENZA VACCINE  11/13/2015    Medications:   Medication List       Accurate as of 11/12/15  2:47 PM. Always use your most recent med list.          aspirin 81 MG chewable tablet Chew 1 tablet (81 mg total) by mouth daily.   carvedilol 3.125 MG tablet Commonly known as:  COREG Take 1 tablet (3.125 mg total) by mouth 2 (two) times daily with a meal.   furosemide 40 MG tablet Commonly known as:  LASIX Take 1 tablet (40 mg total) by mouth daily.   levothyroxine 125 MCG tablet Commonly known as:  SYNTHROID, LEVOTHROID Take 1 TABLET BY MOUTH ONCE DAILY   NUTRITIONAL SUPPLEMENT PO Take 120 mLs by mouth 2 (two) times daily. Med Pass       Review of Systems  Constitutional: Negative for activity change, appetite change, fatigue and fever.  HENT: Negative for congestion, rhinorrhea, sneezing and sore throat.   Eyes: Negative.   Respiratory: Negative for cough, chest tightness, shortness of breath and wheezing.   Cardiovascular: Negative for chest pain, palpitations and leg swelling.  Gastrointestinal: Negative for abdominal distention, abdominal pain, constipation, diarrhea, nausea and vomiting.  Endocrine: Negative for cold intolerance and heat intolerance.  Genitourinary: Negative for dysuria, flank pain, frequency, hematuria and urgency.  Musculoskeletal: Positive for gait problem.  Skin: Negative for color change, pallor and rash.  Neurological: Negative for dizziness, seizures, syncope, light-headedness, numbness and headaches.    Hematological: Does not bruise/bleed easily.  Psychiatric/Behavioral: Negative for agitation, confusion and hallucinations. The patient is not nervous/anxious.     Vitals:   11/12/15 1425  BP: (!) 143/73  Pulse: 79  Resp: 18  Temp: (!) 96.3 F (35.7 C)  TempSrc: Oral  SpO2: 93%  Weight: 122 lb (55.3 kg)  Height:  (1.702 m)   Body mass index is 19.11 kg/m. Physical Exam  Constitutional: She appears well-developed and well-nourished. No distress.  HENT:  Head: Normocephalic.  Mouth/Throat: Oropharynx is clear and moist. No oropharyngeal exudate.  Eyes: Conjunctivae and EOM are normal. Pupils are equal, round, and reactive to light. Right eye exhibits no discharge. Left eye exhibits no discharge. No scleral icterus.  Neck: Normal range of motion. No JVD present. No thyromegaly present.  Cardiovascular:  Intact distal pulses.  Exam reveals no gallop and no friction rub.   Murmur heard. Pacemaker   Pulmonary/Chest: Effort normal and breath sounds normal. No respiratory distress. She has no wheezes. She has no rales.  Abdominal: Soft. Bowel sounds are normal. She exhibits no distension. There is no tenderness. There is no rebound and no guarding.  Musculoskeletal: Normal range of motion. She exhibits no edema or tenderness.  Generalized weakness   Lymphadenopathy:    She has no cervical adenopathy.  Neurological: She is alert.  Skin: Skin is warm. No rash noted. No erythema. No pallor.  Psychiatric: She has a normal mood and affect.    Labs reviewed: Basic Metabolic Panel:  Recent Labs  37/29/02 0708 10/24/15 0457 10/25/15 0503 10/26/15 0455 10/30/15  NA 128* 125* 122* 126* 133*  K 3.2* 4.4 4.5 4.0 3.7  CL 87* 89* 85* 88*  --   CO2 30 26 29 30   --   GLUCOSE 112* 102* 117* 99  --   BUN 24* 22* 27* 23* 22*  CREATININE 1.35* 1.21* 1.31* 1.24* 1.1  CALCIUM 8.5* 8.4* 8.7* 8.7*  --   MG 1.6* 2.4  --   --   --    Liver Function Tests:  Recent Labs  10/30/15  AST  27  ALT 22  ALKPHOS 56   No results for input(s): LIPASE, AMYLASE in the last 8760 hours. No results for input(s): AMMONIA in the last 8760 hours. CBC:  Recent Labs  05/04/15 1602 05/05/15 0305 10/21/15 1450 10/30/15  WBC 11.3* 10.4 11.9* 7.8  NEUTROABS  --   --  10.4*  --   HGB 11.8* 11.4* 11.8* 12.3  HCT 34.1* 33.0* 34.1* 38  MCV 87.4 87.8 87.0  --   PLT 305 284 251 249   Cardiac Enzymes:  Recent Labs  10/21/15 1745 10/21/15 2307 10/22/15 0512  TROPONINI 0.04* 0.05* 0.05*   BNP: Invalid input(s): POCBNP CBG:  Recent Labs  05/05/15 0619  GLUCAP 99    Procedures and Imaging Studies During Stay: Dg Chest 2 View  Result Date: 10/21/2015 CLINICAL DATA:  Shortness of breath and cough. EXAM: CHEST  2 VIEW COMPARISON:  May 09, 2013 FINDINGS: There is mild bibasilar interstitial edema with bilateral pleural effusions. There is atelectatic change in the left base. There is cardiomegaly. The pulmonary vascularity is within normal limits. Pacemaker leads are attached to the right atrium and right ventricle. There is postoperative change in the lower thoracic and lumbar spine regions. IMPRESSION: Findings felt to represent a degree of congestive heart failure. Left base atelectasis present. Pacemaker leads attached to the right atrium and right ventricle. Electronically Signed   By: Bretta Bang III M.D.   On: 10/21/2015 14:48    Assessment/Plan:   HTN B/p stable. Continue on Coreg 3.125mg  tablet. BMP in 1-2 weeks with PCP  CHF Stable. Exam findings negative for wheezing, shortness of breath, cough or rales. Continue on Lasix 40 mg tablet. BMP in 1-2 weeks with PCP. Monitor weight.   CKD BMP in 1-2 weeks with PCP  Chronic Hyponatremia  Stable. BMP in 1-2 weeks with PCP  Protein malnutrition  Continue on Med pass twice daily. Continue to monitor.  Hypothyroidism  Continue on Levothyroxine 125 mcg Tablet. Monitor TSH level periodically   Dysphagia  Seen by  SLP during her course of rehab her diet advanced textured diet with meats/Veggies cut to small pieces with thin liquids. Continue Aspirations precautions.    Generalized Weakness Has  worked with PT/OT now stable for to [facility]. She will be discharged to [facility] with Home health PT/OT to continue with ROM, Exercise, Gait stability and muscle strengthening.  Unsteady gait  She will require a standard WC with Cushion, anti tippers, extended brake handles, removable elevating leg rests  to enable her to maintain current level of independence.Fall and safety precautions.   Patient is being discharged with the following home health services:     Home health PT/OT to continue with ROM, Exercise, Gait stability and muscle strengthening. Patient is being discharged with the following durable medical equipment:    standard WC with Cushion, anti tippers, extended brake handles, removable elevating leg rests  to enable her to maintain current level of independence.  Patient has been advised to f/u with their PCP in 1-2 weeks to bring them up to date on their rehab stay.  Social services at facility was responsible for arranging this appointment.  Pt was provided with a 30 day supply of prescriptions for medications and refills must be obtained from their PCP.  For controlled substances, a more limited supply may be provided adequate until PCP appointment only.  Future labs/tests needed: CBC, BMP in 1-2 weeks with PCP

## 2015-11-20 ENCOUNTER — Telehealth: Payer: Self-pay | Admitting: Interventional Cardiology

## 2015-11-20 NOTE — Telephone Encounter (Signed)
Dr. [name] will decide after hearing from [facility] if he will increase her Lasix to 40 mg BID.

## 2015-11-20 NOTE — Telephone Encounter (Signed)
Son, Dr. Laverle HobbyGregory Kooi, calling stating when he was visiting his Mom on Sunday she appeared to be having more labored breathing.  No edema in ankles.  Does not know her weight.  [facility]. She is currently taking Lasix 40 mg daily. Was [facility] 7/[facility].  He verbalized that he just wanted to get ahead of her breathing before came to point that she had to be readmitted.  Spoke w/ Dr. [name] who wants [facility] to weigh her daily, give one time dose of Lasix 80 mg tonight and weight after.  Also wants nurse to call tomorrow with results of Lasix and if breathing is better.  Spoke w/Kate, med tech, at [facility] and advised of order.  Will fax to [phone].  Advised that they call office tomorrow and speak with Misty StanleyLisa Parris-Godley,CMA with results. Will forward to Misty StanleyLisa as FYI for tomorrow.

## 2015-11-20 NOTE — Telephone Encounter (Signed)
New message  Pt Son call requesting to get pt seen soon than appt set 8/17. Pt son states he has been monitoring pt breathing and it has gotten really heavy and retaining a lot of fluid. Pt son would like a call back to discuss

## 2015-11-21 NOTE — Telephone Encounter (Signed)
Changes the daily Lasix dose to 60 mg per day. Please do a basic metabolic panel on Monday and for the results to me.

## 2015-11-21 NOTE — Telephone Encounter (Signed)
Bethany Landry @ [facility] called to give an update on patients weight and sob. Patient was given an increased dose of Lasix 80mg  yesterday at the instruction of Dr.Dambrosia. patienst is also to be weighed daily under the same circumstances.  Patients weight when she moved in was 122lb Patients weight this morning 121.6lb Bethany Landry reports that the patients sob has improved and she is doing well. Adv her that I will fwd the update to Dr.Indelicato and call back if he has any additional recommendations. They are to call back if the patients develops sob, selling, weight gain. Bethany Landry verbalized understanding.

## 2015-11-23 ENCOUNTER — Telehealth: Payer: Self-pay | Admitting: Interventional Cardiology

## 2015-11-23 NOTE — Telephone Encounter (Signed)
Dr.Griffiths's signed orders [facility] attn:Katie fax # [phone].  Increase Lasix to 60mg  daily. Bmet on 8/14 results to be faxed to our office.

## 2015-11-23 NOTE — Telephone Encounter (Signed)
Spoke with TurkeyVictoria. ICD-10 code given to draw bmet

## 2015-11-23 NOTE — Telephone Encounter (Signed)
Bethany Landry is calling to get the ICD -10 code to draw a BMET and for the lab to draw they need a code . Their doctor is wanting them to draw labs .Marland Kitchen. Please call

## 2015-11-26 ENCOUNTER — Telehealth: Payer: Self-pay | Admitting: Interventional Cardiology

## 2015-11-26 NOTE — Telephone Encounter (Signed)
Returned call to SuncrestNicole davis [facility] adv her that it would be ok to have pt's bmet drawn tomorrow instead of today. They should fax results to our office attn: Dr.Wandersee as planned.

## 2015-11-26 NOTE — Telephone Encounter (Signed)
Bethany Landry is calling because the persons who draw the labs at their facility will not be able to come on tomorrow to draw labs . Will that be ok ?  Please call   Thanks

## 2015-11-27 ENCOUNTER — Encounter: Payer: Self-pay | Admitting: Interventional Cardiology

## 2015-11-29 ENCOUNTER — Telehealth: Payer: Self-pay | Admitting: Internal Medicine

## 2015-11-29 ENCOUNTER — Ambulatory Visit (INDEPENDENT_AMBULATORY_CARE_PROVIDER_SITE_OTHER): Payer: Medicare Other | Admitting: Internal Medicine

## 2015-11-29 ENCOUNTER — Encounter (INDEPENDENT_AMBULATORY_CARE_PROVIDER_SITE_OTHER): Payer: Self-pay

## 2015-11-29 ENCOUNTER — Encounter: Payer: Self-pay | Admitting: Internal Medicine

## 2015-11-29 VITALS — BP 137/79 | HR 84 | Ht 63.0 in | Wt 119.4 lb

## 2015-11-29 DIAGNOSIS — Z79899 Other long term (current) drug therapy: Secondary | ICD-10-CM

## 2015-11-29 DIAGNOSIS — I5023 Acute on chronic systolic (congestive) heart failure: Secondary | ICD-10-CM

## 2015-11-29 DIAGNOSIS — Z950 Presence of cardiac pacemaker: Secondary | ICD-10-CM

## 2015-11-29 DIAGNOSIS — R0602 Shortness of breath: Secondary | ICD-10-CM | POA: Diagnosis not present

## 2015-11-29 NOTE — Telephone Encounter (Signed)
New message       Talk to the nurse about the order that was faxed today regarding patient's medication.

## 2015-11-29 NOTE — Progress Notes (Signed)
OFFICE NOTE  Chief Complaint:  Hospital follow-up, dyspnea  Primary Care Physician: Clelia SchaumannIbethal J Shamleffer, MD  HPI:  Bethany Landry is a 82 y.o. female who is a patient of Dr. Garnette ScheuermannHank Stjames. She was recently hospitalized in July with acute on chronic systolic heart failure and noted to have a further reduced LVEF of 25-30%. She was diuresed with IV Lasix and discharged on home Lasix 40 mg daily. She does have stage III chronic kidney disease as well. At discharge she did well in [facility] nursing and transition to assisted living. Her son is Dr. Carlyle BasquesGregg Cardosa who is an anesthesiologist and I know very well. He feels that her respirations have been more labored recently and she had significant weight gain. There is a telephone note indicating that he contacted the office in her Lasix was increased from 40-60 mg daily. Repeat lab work shows a basic metabolic profile which was reviewed by Dr. [name] and felt to be stable. She denies any leg edema but has been more short of breath and particularly when laying down.  PMHx:  Past Medical History:  Diagnosis Date  . Benign hypertension   . CKD (chronic kidney disease), stage III    moderate  . Complete heart block (HCC)   . DOE (dyspnea on exertion)   . Fungal infection    recurrent  . Hyperlipidemia   . Hypothyroidism   . Insomnia   . Mastoiditis    hearing loss, s/p mastoiectomy, right ear  . Osteoarthritis   . Osteoporosis   . Sinoatrial node dysfunction (HCC)     Past Surgical History:  Procedure Laterality Date  . BACK SURGERY    . CATARACT EXTRACTION    . INSERT / REPLACE / REMOVE PACEMAKER  11/26/09   St. Jude, DDD PM  . LAPAROTOMY     Ex lap for abdominal pain (ischemic colitis)  . MASTOIDECTOMY Right    Tumor removal and residual recurrent fungal infection. Dr. Pollyann Kennedyosen  . THYROIDECTOMY      FAMHx:  Family History  Problem Relation Age of Onset  . CAD Brother   . Cerebral aneurysm Brother   . Heart attack Mother      SOCHx:   reports that she quit smoking about 33 years ago. Her smoking use included Cigarettes. She does not have any smokeless tobacco history on file. She reports that she drinks alcohol. She reports that she does not use drugs.  ALLERGIES:  Allergies  Allergen Reactions  . Norvasc [Amlodipine Besylate] Swelling    Swelling in ankles  . Quinapril Hcl Swelling  . Ace Inhibitors Swelling and Cough    ROS: Pertinent items noted in HPI and remainder of comprehensive ROS otherwise negative.  HOME MEDS: Current Outpatient Prescriptions  Medication Sig Dispense Refill  . acetaminophen (TYLENOL) 500 MG tablet Take 500 mg by mouth 2 (two) times daily as needed for mild pain or moderate pain.    Marland Kitchen. aspirin 81 MG chewable tablet Chew 1 tablet (81 mg total) by mouth daily. 30 tablet 0  . carvedilol (COREG) 3.125 MG tablet Take 1 tablet (3.125 mg total) by mouth 2 (two) times daily with a meal. 60 tablet 0  . furosemide (LASIX) 40 MG tablet Take 40 mg by mouth 2 (two) times daily. Until Monday 8/21    . levothyroxine (SYNTHROID, LEVOTHROID) 125 MCG tablet Take 1 TABLET BY MOUTH ONCE DAILY  6  . Nutritional Supplements (NUTRITIONAL SUPPLEMENT PO) Take 120 mLs by mouth 2 (two) times  daily. Med Pass     No current facility-administered medications for this visit.     LABS/IMAGING: No results found for this or any previous visit (from the past 48 hour(s)). No results found.  WEIGHTS: Wt Readings from Last 3 Encounters:  11/29/15 119 lb 6.4 oz (54.2 kg)  11/12/15 122 lb (55.3 kg)  10/29/15 122 lb (55.3 kg)    VITALS: BP 137/79   Pulse 84   Ht 5\' 3"  (1.6 m)   Wt 119 lb 6.4 oz (54.2 kg)   BMI 21.15 kg/m   EXAM: General appearance: alert and no distress Neck: JVD - 3 cm above sternal notch and no carotid bruit Lungs: diminished breath sounds RLL and RML and rales RLL and RML Heart: regular rate and rhythm Abdomen: soft, non-tender; bowel sounds normal; no masses,  no  organomegaly Extremities: extremities normal, atraumatic, no cyanosis or edema Pulses: 2+ and symmetric Skin: Skin color, texture, turgor normal. No rashes or lesions Neurologic: Grossly normal Psych: Pleasant, mild dementia  EKG: Deferred  ASSESSMENT: 1. Acute on chronic systolic congestive heart failure, LVEF 25-30% 2. CKD3 3. Hypertension 4. S/p PPM  PLAN: 1.   Mrs. [name] recently had hospitalization for worsening shortness of breath and underwent IV diuresis with improvement in her symptoms. She is now started to gain some weight and become more short of breath. On exam today there are rales halfway up the right lung. She has no peripheral edema but elevated JVP. I like to increase her Lasix to 40 mg twice daily. We will obtain repeat blood work including a basic metabolic profile and BNP next Monday. Based on those findings I may then decrease her dose back or continue increased dose Lasix. We have advised [facility] to check her weights daily and record them closely. Follow-up with me in a couple of weeks since Dr. [name] does not have any office availability.  Chrystie NoseKenneth C. Hilty, MD, [facility] Attending Cardiologist [facility]  Bethany Landry 11/29/2015, 1:16 PM

## 2015-11-29 NOTE — Telephone Encounter (Signed)
LM for TurkeyVictoria that I spoke with a clinician [facility] regarding patient's medication changes/labs and have received a fax that the MD needs to sign off on.

## 2015-11-29 NOTE — Patient Instructions (Addendum)
Your physician has recommended you make the following change in your medication...  1. INCREASE lasix to 40mg  twice daily until Monday or until labs have been reviewed by Dr. Rennis GoldenHilty  Your physician recommends that you return for lab work on Monday @ [facility] (BMET, BNP)  Please have the labs faxed to [phone] to Dr. Blanchie DessertHilty's attention so he can review/adjust medications.   Your physician recommends that you schedule a follow-up appointment in: TWO WEEKS with Dr. Rennis GoldenHilty - OK to double book  Spoke with [facility] clinical staff regarding med changes & labs. They faxed over paperwork/orders for MD to sign off on

## 2015-12-03 ENCOUNTER — Encounter: Payer: Self-pay | Admitting: Internal Medicine

## 2015-12-05 ENCOUNTER — Telehealth: Payer: Self-pay | Admitting: Internal Medicine

## 2015-12-05 NOTE — Telephone Encounter (Signed)
Spoke with patient's son and provided update on labs/med instructions  [facility] and provided med update to TabNicole. Faxed over labs w/written orders to take lasix 40mg  BID until follow up (12/12/15) to this facility @ [phone]

## 2015-12-06 NOTE — Telephone Encounter (Signed)
Faxed signed documentation regarding lasix dose - patient had gone back to lasix 60mg  QD and will resume 40mg  BID 12/06/15

## 2015-12-10 ENCOUNTER — Telehealth: Payer: Self-pay | Admitting: Internal Medicine

## 2015-12-10 NOTE — Telephone Encounter (Signed)
Patient of Dr. Mendel RyderH. Lindeman whom Dr. Rennis GoldenHilty has seen x1  Received fax that states "Resident c/o back pain this morning [12/08/15] during her med pass. Resident states she was in pain with stiffness. Please advise.."  Per Dr. Rennis GoldenHilty - defer to PCP   Written notification faxed

## 2015-12-12 ENCOUNTER — Ambulatory Visit (INDEPENDENT_AMBULATORY_CARE_PROVIDER_SITE_OTHER): Payer: Medicare Other | Admitting: Internal Medicine

## 2015-12-12 ENCOUNTER — Encounter: Payer: Self-pay | Admitting: Internal Medicine

## 2015-12-12 VITALS — BP 133/73 | HR 70 | Ht 63.0 in | Wt 117.4 lb

## 2015-12-12 DIAGNOSIS — I5023 Acute on chronic systolic (congestive) heart failure: Secondary | ICD-10-CM

## 2015-12-12 DIAGNOSIS — R0602 Shortness of breath: Secondary | ICD-10-CM | POA: Diagnosis not present

## 2015-12-12 DIAGNOSIS — Z950 Presence of cardiac pacemaker: Secondary | ICD-10-CM | POA: Diagnosis not present

## 2015-12-12 NOTE — Progress Notes (Signed)
Thanks for helping her. 

## 2015-12-12 NOTE — Patient Instructions (Signed)
Your physician recommends that you schedule a follow-up appointment in: THREE MONTHS with Dr. [name]

## 2015-12-12 NOTE — Progress Notes (Signed)
OFFICE NOTE  Chief Complaint:  Follow-up dyspnea  Primary Care Physician: Clelia SchaumannIbethal J Shamleffer, MD  HPI:  Bethany Landry is a 82 y.o. female who is a patient of Dr. Garnette ScheuermannHank Barthel. She was recently hospitalized in July with acute on chronic systolic heart failure and noted to have a further reduced LVEF of 25-30%. She was diuresed with IV Lasix and discharged on home Lasix 40 mg daily. She does have stage III chronic kidney disease as well. At discharge she did well in [facility] nursing and transition to assisted living. Her son is Dr. Carlyle BasquesGregg Angelino who is an anesthesiologist and I know very well. He feels that her respirations have been more labored recently and she had significant weight gain. There is a telephone note indicating that he contacted the office in her Lasix was increased from 40-60 mg daily. Repeat lab work shows a basic metabolic profile which was reviewed by Dr. Katrinka Landry and felt to be stable. She denies any leg edema but has been more short of breath and particularly when laying down.  12/12/2015  Bethany Landry returns today for follow-up of her dyspnea. While last saw her there were some basilar crackles particularly in the right base as well as some evidence of elevated fluid status. I recommended increasing her Lasix to 40 mg twice a day. I repeated lab work which showed an elevated BNP of approximately 350. After taking Lasix twice daily her creatinine remained fairly stable around 1.1 with mild hyponatremia which was stable. She noted a small increase in her shortness of breath and weight is down about 2 pounds from her last office visit. I discussed this with her son as well and feel that we are probably best served keep her on twice daily Lasix.  PMHx:  Past Medical History:  Diagnosis Date  . Benign hypertension   . CKD (chronic kidney disease), stage III    moderate  . Complete heart block (HCC)   . DOE (dyspnea on exertion)   . Fungal infection    recurrent  .  Hyperlipidemia   . Hypothyroidism   . Insomnia   . Mastoiditis    hearing loss, s/p mastoiectomy, right ear  . Osteoarthritis   . Osteoporosis   . Sinoatrial node dysfunction (HCC)     Past Surgical History:  Procedure Laterality Date  . BACK SURGERY    . CATARACT EXTRACTION    . INSERT / REPLACE / REMOVE PACEMAKER  11/26/09   [employer]. Jude, DDD PM  . LAPAROTOMY     Ex lap for abdominal pain (ischemic colitis)  . MASTOIDECTOMY Right    Tumor removal and residual recurrent fungal infection. Dr. Pollyann Kennedyosen  . THYROIDECTOMY      FAMHx:  Family History  Problem Relation Age of Onset  . CAD Brother   . Cerebral aneurysm Brother   . Heart attack Mother     SOCHx:   reports that she quit smoking about 33 years ago. Her smoking use included Cigarettes. She does not have any smokeless tobacco history on file. She reports that she drinks alcohol. She reports that she does not use drugs.  ALLERGIES:  Allergies  Allergen Reactions  . Norvasc [Amlodipine Besylate] Swelling    Swelling in ankles  . Quinapril Hcl Swelling  . Ace Inhibitors Swelling and Cough    ROS: Pertinent items noted in HPI and remainder of comprehensive ROS otherwise negative.  HOME MEDS: Current Outpatient Prescriptions  Medication Sig Dispense Refill  . acetaminophen (TYLENOL)  500 MG tablet Take 500 mg by mouth 2 (two) times daily as needed for mild pain or moderate pain.    Marland Kitchen aspirin 81 MG chewable tablet Chew 1 tablet (81 mg total) by mouth daily. 30 tablet 0  . carvedilol (COREG) 3.125 MG tablet Take 1 tablet (3.125 mg total) by mouth 2 (two) times daily with a meal. 60 tablet 0  . furosemide (LASIX) 40 MG tablet Take 40 mg by mouth 2 (two) times daily.     Marland Kitchen levothyroxine (SYNTHROID, LEVOTHROID) 125 MCG tablet Take 1 TABLET BY MOUTH ONCE DAILY  6  . NON FORMULARY Prune Juice    . Nutritional Supplements (NUTRITIONAL SUPPLEMENT PO) Take 120 mLs by mouth 2 (two) times daily. Med Pass     No current  facility-administered medications for this visit.     LABS/IMAGING: No results found for this or any previous visit (from the past 48 hour(s)). No results found.  WEIGHTS: Wt Readings from Last 3 Encounters:  12/12/15 117 lb 6.4 oz (53.3 kg)  11/29/15 119 lb 6.4 oz (54.2 kg)  11/12/15 122 lb (55.3 kg)    VITALS: BP 133/73   Pulse 70   Ht 5\' 3"  (1.6 m)   Wt 117 lb 6.4 oz (53.3 kg)   BMI 20.80 kg/m   EXAM: Deferred  EKG: Deferred  ASSESSMENT: 1. Acute on chronic systolic congestive heart failure, LVEF 25-30% 2. CKD3 3. Hypertension 4. S/p PPM  PLAN: 1.   Bethany Landry seems to be somewhat improved with her shortness of breath on increased dose Lasix. I like to keep her on Lasix 40 mg twice daily. Her creatinine has been stable with this. Blood pressure is well controlled. Dry weight is somewhere around 115 to 117 pounds. We will arrange follow-up with Dr. Garnette Scheuermann.   Chrystie Nose, MD, [facility] Attending Cardiologist [facility]  Lisette Abu Kase Shughart 12/12/2015, 2:50 PM

## 2015-12-29 ENCOUNTER — Encounter (HOSPITAL_COMMUNITY): Payer: Self-pay | Admitting: *Deleted

## 2015-12-29 ENCOUNTER — Emergency Department (HOSPITAL_COMMUNITY): Payer: Medicare Other

## 2015-12-29 ENCOUNTER — Observation Stay (HOSPITAL_COMMUNITY)
Admission: EM | Admit: 2015-12-29 | Discharge: 2015-12-31 | Disposition: A | Discharge status: Home / Self Care | Payer: Medicare Other | Attending: Internal Medicine | Admitting: Internal Medicine

## 2015-12-29 DIAGNOSIS — S2231XA Fracture of one rib, right side, initial encounter for closed fracture: Secondary | ICD-10-CM

## 2015-12-29 DIAGNOSIS — S0990XA Unspecified injury of head, initial encounter: Secondary | ICD-10-CM | POA: Insufficient documentation

## 2015-12-29 DIAGNOSIS — W1811XA Fall from or off toilet without subsequent striking against object, initial encounter: Secondary | ICD-10-CM | POA: Insufficient documentation

## 2015-12-29 DIAGNOSIS — S42001A Fracture of unspecified part of right clavicle, initial encounter for closed fracture: Secondary | ICD-10-CM | POA: Diagnosis present

## 2015-12-29 DIAGNOSIS — S0003XA Contusion of scalp, initial encounter: Secondary | ICD-10-CM | POA: Diagnosis not present

## 2015-12-29 DIAGNOSIS — I5022 Chronic systolic (congestive) heart failure: Secondary | ICD-10-CM | POA: Diagnosis present

## 2015-12-29 DIAGNOSIS — Y939 Activity, unspecified: Secondary | ICD-10-CM | POA: Insufficient documentation

## 2015-12-29 DIAGNOSIS — R0600 Dyspnea, unspecified: Secondary | ICD-10-CM | POA: Diagnosis not present

## 2015-12-29 DIAGNOSIS — W19XXXA Unspecified fall, initial encounter: Secondary | ICD-10-CM

## 2015-12-29 DIAGNOSIS — E871 Hypo-osmolality and hyponatremia: Secondary | ICD-10-CM | POA: Diagnosis not present

## 2015-12-29 DIAGNOSIS — I130 Hypertensive heart and chronic kidney disease with heart failure and stage 1 through stage 4 chronic kidney disease, or unspecified chronic kidney disease: Secondary | ICD-10-CM | POA: Diagnosis not present

## 2015-12-29 DIAGNOSIS — Y92121 Bathroom in nursing home as the place of occurrence of the external cause: Secondary | ICD-10-CM | POA: Insufficient documentation

## 2015-12-29 DIAGNOSIS — S2241XA Multiple fractures of ribs, right side, initial encounter for closed fracture: Secondary | ICD-10-CM | POA: Diagnosis present

## 2015-12-29 DIAGNOSIS — E43 Unspecified severe protein-calorie malnutrition: Secondary | ICD-10-CM

## 2015-12-29 DIAGNOSIS — Z87891 Personal history of nicotine dependence: Secondary | ICD-10-CM | POA: Insufficient documentation

## 2015-12-29 DIAGNOSIS — E039 Hypothyroidism, unspecified: Secondary | ICD-10-CM | POA: Diagnosis not present

## 2015-12-29 DIAGNOSIS — S42001P Fracture of unspecified part of right clavicle, subsequent encounter for fracture with malunion: Secondary | ICD-10-CM

## 2015-12-29 DIAGNOSIS — Z950 Presence of cardiac pacemaker: Secondary | ICD-10-CM | POA: Diagnosis present

## 2015-12-29 DIAGNOSIS — R52 Pain, unspecified: Secondary | ICD-10-CM

## 2015-12-29 DIAGNOSIS — S42031A Displaced fracture of lateral end of right clavicle, initial encounter for closed fracture: Principal | ICD-10-CM | POA: Insufficient documentation

## 2015-12-29 DIAGNOSIS — Z23 Encounter for immunization: Secondary | ICD-10-CM | POA: Insufficient documentation

## 2015-12-29 DIAGNOSIS — Y999 Unspecified external cause status: Secondary | ICD-10-CM | POA: Diagnosis not present

## 2015-12-29 DIAGNOSIS — N183 Chronic kidney disease, stage 3 unspecified: Secondary | ICD-10-CM | POA: Diagnosis present

## 2015-12-29 DIAGNOSIS — S2239XA Fracture of one rib, unspecified side, initial encounter for closed fracture: Secondary | ICD-10-CM

## 2015-12-29 DIAGNOSIS — R55 Syncope and collapse: Secondary | ICD-10-CM | POA: Diagnosis present

## 2015-12-29 DIAGNOSIS — I10 Essential (primary) hypertension: Secondary | ICD-10-CM | POA: Diagnosis present

## 2015-12-29 DIAGNOSIS — S4991XA Unspecified injury of right shoulder and upper arm, initial encounter: Secondary | ICD-10-CM | POA: Diagnosis present

## 2015-12-29 DIAGNOSIS — S2249XA Multiple fractures of ribs, unspecified side, initial encounter for closed fracture: Secondary | ICD-10-CM

## 2015-12-29 DIAGNOSIS — Z7982 Long term (current) use of aspirin: Secondary | ICD-10-CM | POA: Diagnosis not present

## 2015-12-29 DIAGNOSIS — I5023 Acute on chronic systolic (congestive) heart failure: Secondary | ICD-10-CM | POA: Diagnosis not present

## 2015-12-29 LAB — URINALYSIS, ROUTINE W REFLEX MICROSCOPIC
Bilirubin Urine: NEGATIVE
Glucose, UA: NEGATIVE mg/dL
Hgb urine dipstick: NEGATIVE
Ketones, ur: NEGATIVE mg/dL
Nitrite: NEGATIVE
PROTEIN: NEGATIVE mg/dL
Specific Gravity, Urine: 1.009 (ref 1.005–1.030)
pH: 7 (ref 5.0–8.0)

## 2015-12-29 LAB — COMPREHENSIVE METABOLIC PANEL
ALK PHOS: 57 U/L (ref 38–126)
ALT: 15 U/L (ref 14–54)
ANION GAP: 9 (ref 5–15)
AST: 21 U/L (ref 15–41)
Albumin: 3.4 g/dL — ABNORMAL LOW (ref 3.5–5.0)
BILIRUBIN TOTAL: 0.4 mg/dL (ref 0.3–1.2)
BUN: 24 mg/dL — ABNORMAL HIGH (ref 6–20)
CO2: 28 mmol/L (ref 22–32)
Calcium: 9 mg/dL (ref 8.9–10.3)
Chloride: 96 mmol/L — ABNORMAL LOW (ref 101–111)
Creatinine, Ser: 1.25 mg/dL — ABNORMAL HIGH (ref 0.44–1.00)
GFR calc Af Amer: 42 mL/min — ABNORMAL LOW (ref >60)
GFR, EST NON AFRICAN AMERICAN: 36 mL/min — AB (ref >60)
Glucose, Bld: 120 mg/dL — ABNORMAL HIGH (ref 65–99)
POTASSIUM: 4 mmol/L (ref 3.5–5.1)
Sodium: 133 mmol/L — ABNORMAL LOW (ref 135–145)
TOTAL PROTEIN: 6.9 g/dL (ref 6.5–8.1)

## 2015-12-29 LAB — CBC WITH DIFFERENTIAL/PLATELET
Basophils Absolute: 0 10*3/uL (ref 0.0–0.1)
Basophils Relative: 0 %
Eosinophils Absolute: 0.3 10*3/uL (ref 0.0–0.7)
Eosinophils Relative: 2 %
HEMATOCRIT: 34.8 % — AB (ref 36.0–46.0)
Hemoglobin: 11.5 g/dL — ABNORMAL LOW (ref 12.0–15.0)
LYMPHS PCT: 11 %
Lymphs Abs: 1.2 10*3/uL (ref 0.7–4.0)
MCH: 29.6 pg (ref 26.0–34.0)
MCHC: 33 g/dL (ref 30.0–36.0)
MCV: 89.7 fL (ref 78.0–100.0)
MONO ABS: 0.6 10*3/uL (ref 0.1–1.0)
MONOS PCT: 5 %
NEUTROS ABS: 9.2 10*3/uL — AB (ref 1.7–7.7)
Neutrophils Relative %: 82 %
Platelets: 250 10*3/uL (ref 150–400)
RBC: 3.88 MIL/uL (ref 3.87–5.11)
RDW: 13.7 % (ref 11.5–15.5)
WBC: 11.4 10*3/uL — ABNORMAL HIGH (ref 4.0–10.5)

## 2015-12-29 LAB — I-STAT TROPONIN, ED: TROPONIN I, POC: 0.16 ng/mL — AB (ref 0.00–0.08)

## 2015-12-29 LAB — BRAIN NATRIURETIC PEPTIDE: B NATRIURETIC PEPTIDE 5: 679.9 pg/mL — AB (ref 0.0–100.0)

## 2015-12-29 LAB — URINE MICROSCOPIC-ADD ON: RBC / HPF: NONE SEEN RBC/hpf (ref 0–5)

## 2015-12-29 MED ORDER — OXYCODONE HCL 5 MG PO TABS
2.5000 mg | ORAL_TABLET | ORAL | Status: DC | PRN
  Administered 2015-12-30 (×2): 5 mg via ORAL
  Filled 2015-12-29 (×3): qty 1

## 2015-12-29 MED ORDER — TETANUS-DIPHTH-ACELL PERTUSSIS 5-2.5-18.5 LF-MCG/0.5 IM SUSP
0.5000 mL | Freq: Once | INTRAMUSCULAR | Status: AC
  Administered 2015-12-29: 0.5 mL via INTRAMUSCULAR
  Filled 2015-12-29: qty 0.5

## 2015-12-29 MED ORDER — CARVEDILOL 3.125 MG PO TABS
3.1250 mg | ORAL_TABLET | Freq: Two times a day (BID) | ORAL | Status: DC
  Administered 2015-12-30 – 2015-12-31 (×4): 3.125 mg via ORAL
  Filled 2015-12-29 (×4): qty 1

## 2015-12-29 MED ORDER — ACETAMINOPHEN 500 MG PO TABS
1000.0000 mg | ORAL_TABLET | Freq: Three times a day (TID) | ORAL | Status: DC
  Administered 2015-12-30 – 2015-12-31 (×5): 1000 mg via ORAL
  Filled 2015-12-29 (×6): qty 2

## 2015-12-29 MED ORDER — SODIUM CHLORIDE 0.9 % IV SOLN
INTRAVENOUS | Status: DC
  Administered 2015-12-30: 1000 mL via INTRAVENOUS

## 2015-12-29 MED ORDER — ACETAMINOPHEN 325 MG PO TABS
650.0000 mg | ORAL_TABLET | Freq: Once | ORAL | Status: AC
  Administered 2015-12-29: 650 mg via ORAL
  Filled 2015-12-29: qty 2

## 2015-12-29 MED ORDER — SODIUM CHLORIDE 0.9% FLUSH
3.0000 mL | Freq: Two times a day (BID) | INTRAVENOUS | Status: DC
  Administered 2015-12-30 – 2015-12-31 (×3): 3 mL via INTRAVENOUS

## 2015-12-29 MED ORDER — ENSURE PLUS PO LIQD: 237.0000 mL | Freq: Two times a day (BID) | ORAL | Status: DC | PRN

## 2015-12-29 MED ORDER — LEVOTHYROXINE SODIUM 25 MCG PO TABS
125.0000 ug | ORAL_TABLET | Freq: Every day | ORAL | Status: DC
  Administered 2015-12-30 – 2015-12-31 (×2): 125 ug via ORAL
  Filled 2015-12-29 (×2): qty 1

## 2015-12-29 MED ORDER — HYDROMORPHONE HCL 1 MG/ML IJ SOLN: 0.5000 mg | INTRAMUSCULAR | Status: DC | PRN

## 2015-12-29 MED ORDER — ONDANSETRON HCL 4 MG PO TABS: 4.0000 mg | ORAL_TABLET | Freq: Four times a day (QID) | ORAL | Status: DC | PRN

## 2015-12-29 MED ORDER — SODIUM CHLORIDE 0.9 % IV BOLUS (SEPSIS)
1000.0000 mL | Freq: Once | INTRAVENOUS | Status: AC
  Administered 2015-12-29: 1000 mL via INTRAVENOUS

## 2015-12-29 MED ORDER — ONDANSETRON HCL 4 MG/2ML IJ SOLN: 4.0000 mg | Freq: Four times a day (QID) | INTRAMUSCULAR | Status: DC | PRN

## 2015-12-29 MED ORDER — SENNOSIDES-DOCUSATE SODIUM 8.6-50 MG PO TABS: 1.0000 | ORAL_TABLET | Freq: Every evening | ORAL | Status: DC | PRN

## 2015-12-29 MED ORDER — ENSURE ENLIVE PO LIQD: 237.0000 mL | Freq: Two times a day (BID) | ORAL | Status: DC | PRN

## 2015-12-29 NOTE — ED Notes (Signed)
NS bolus stopped at this time per provider request.  600cc's noted to have infused.  Admitting physician at the bedside.

## 2015-12-29 NOTE — ED Notes (Signed)
Attempted report.  Nurse to call back when available. 

## 2015-12-29 NOTE — ED Notes (Signed)
Spoke with son with permission from patient.  Updated on condition and plan for admission.  Reports that he will be out of town until Monday.

## 2015-12-29 NOTE — ED Provider Notes (Signed)
[facility]-EMERGENCY DEPT Provider Note   CSN: [id number] Arrival date & time: 12/29/15  1705     History   Chief Complaint Chief Complaint  Patient presents with  . Fall    HPI Bethany Landry is a 82 y.o. female.  Patient is a 82 year old female past medical history of hypertension, osteoarthritis, hypothyroidism, complete heart block with pacemaker and CK D who presents the ED via EMS [facility] s/p unwitnessed fall, onset PTA. Patient reports she was sitting on the toilet urinating and notes when she was about to stand up she began to feel dizzy and felt like the room was spinning. She notes the next thing she remembers is laying on her right side on the ground next to the toilet. LOC unknown. She reports her dizziness resolved when she was laying on the ground. Patient reports having pain to the right side of her head, right shoulder, right side of her chest. Patient states pain is worse with movement. Patient states her chest pain is worse with coughing. She notes she has had a nonproductive cough for the past week. Denies fever, visual changes, difficulty breathing, wheezing, palpitations, abdominal pain, nausea, vomiting, diarrhea, urinary symptoms, blood in urine or stool, numbness, tingling, weakness. Patient states she has had similar episodes of dizziness in the past but has not been evaluated for them. Denies use of anticoagulants. Tetanus status unknown.      Past Medical History:  Diagnosis Date  . Benign hypertension   . CKD (chronic kidney disease), stage III    moderate  . Complete heart block (HCC)   . DOE (dyspnea on exertion)   . Fungal infection    recurrent  . Hyperlipidemia   . Hypothyroidism   . Insomnia   . Mastoiditis    hearing loss, s/p mastoiectomy, right ear  . Osteoarthritis   . Osteoporosis   . Sinoatrial node dysfunction [facility](HCC)     Patient Active Problem List   Diagnosis Date Noted  . Shortness of breath 11/29/2015  .  Medication management 11/29/2015  . Hypothyroidism 11/12/2015  . Dysphagia 11/12/2015  . Acute on chronic systolic (congestive) heart failure (HCC) 10/26/2015  . Hyponatremia   . Protein-calorie malnutrition, severe 10/24/2015  . Syncope 05/04/2015  . Fall 05/04/2015  . Elevated systolic blood pressure 05/04/2015  . CKD (chronic kidney disease), stage III 05/04/2015  . Chronic hyponatremia 05/04/2015  . Occipital scalp laceration 05/04/2015  . Interstitial lung disease (HCC) 06/14/2013  . Cardiac pacemaker 06/14/2013  . Complete heart block (HCC) 05/05/2013  . Essential hypertension 05/05/2013    Past Surgical History:  Procedure Laterality Date  . BACK SURGERY    . CATARACT EXTRACTION    . INSERT / REPLACE / REMOVE PACEMAKER  11/26/09   [employer]. Jude, DDD PM  . LAPAROTOMY     Ex lap for abdominal pain (ischemic colitis)  . MASTOIDECTOMY Right    Tumor removal and residual recurrent fungal infection. Dr. Pollyann Kennedyosen  . THYROIDECTOMY      OB History    No data available       Home Medications    Prior to Admission medications   Medication Sig Start Date End Date Taking? Authorizing Provider  acetaminophen (TYLENOL) 500 MG tablet Take 500 mg by mouth 2 (two) times daily as needed for mild pain or moderate pain.   Yes Historical Provider, MD  aspirin 81 MG chewable tablet Chew 1 tablet (81 mg total) by mouth daily. 10/26/15  Yes Nishant Dhungel,  MD  carvedilol (COREG) 3.125 MG tablet Take 1 tablet (3.125 mg total) by mouth 2 (two) times daily with a meal. 10/26/15  Yes Nishant Dhungel, MD  ENSURE PLUS (ENSURE PLUS) LIQD Take 237 mLs by mouth 2 (two) times daily as needed (for lack of appetite).   Yes Historical Provider, MD  furosemide (LASIX) 40 MG tablet Take 40 mg by mouth 2 (two) times daily.    Yes Historical Provider, MD  levothyroxine (SYNTHROID, LEVOTHROID) 125 MCG tablet Take 1 TABLET (125 mcg) BY MOUTH ONCE DAILY 11/17/14  Yes Historical Provider, MD  NON FORMULARY Take 240 mLs  by mouth as needed (for constipation). Prune Juice    Yes Historical Provider, MD    Family History Family History  Problem Relation Age of Onset  . CAD Brother   . Cerebral aneurysm Brother   . Heart attack Mother     Social History Social History  Substance Use Topics  . Smoking status: Former Smoker    Types: Cigarettes    Quit date: 04/14/1982  . Smokeless tobacco: Never Used  . Alcohol use Yes     Comment: wine, occasionally     Allergies   Norvasc [amlodipine besylate]; Quinapril hcl; and Ace inhibitors   Review of Systems Review of Systems  Musculoskeletal: Positive for arthralgias (right shoulder) and neck pain.  Skin: Positive for wound (hematoma).  Neurological: Positive for dizziness.  All other systems reviewed and are negative.    Physical Exam Updated Vital Signs BP 168/60   Pulse 78   Temp 97.8 F (36.6 C)   Resp (!) 33   Ht 5\' 3"  (1.6 m)   Wt 53.1 kg   SpO2 97%   BMI 20.73 kg/m   Physical Exam  Constitutional: She is oriented to person, place, and time. She appears well-developed and well-nourished. No distress.  Elderly appearing female  HENT:  Head: Normocephalic. Head is with abrasion and with contusion (3cm hematoma noted to right parietal region with small abrasion present, no active bleeding). Head is without raccoon's eyes, without Battle's sign and without laceration.  Right Ear: Tympanic membrane normal. No hemotympanum.  Left Ear: Tympanic membrane normal. No hemotympanum.  Nose: Nose normal. No sinus tenderness, nasal deformity, septal deviation or nasal septal hematoma. No epistaxis.  Mouth/Throat: Uvula is midline, oropharynx is clear and moist and mucous membranes are normal. No oropharyngeal exudate, posterior oropharyngeal edema, posterior oropharyngeal erythema or tonsillar abscesses. No tonsillar exudate.  Eyes: Conjunctivae and EOM are normal. Pupils are equal, round, and reactive to light. Right eye exhibits no discharge.  Left eye exhibits no discharge. No scleral icterus.  Neck: Normal range of motion. Neck supple.  Cardiovascular: Normal rate, regular rhythm, normal heart sounds and intact distal pulses.   Pulmonary/Chest: Effort normal and breath sounds normal. No respiratory distress. She has no wheezes. She has no rales. She exhibits tenderness (TTP over right anterior chest wall). She exhibits no laceration, no crepitus, no edema, no deformity, no swelling and no retraction.  Abdominal: Soft. Bowel sounds are normal. She exhibits no distension and no mass. There is no tenderness. There is no rebound and no guarding. No hernia.  Musculoskeletal: She exhibits tenderness. She exhibits no edema or deformity.       Right shoulder: She exhibits decreased range of motion, tenderness and decreased strength (due to pain). She exhibits no swelling, no effusion, no crepitus, no deformity, no laceration and normal pulse.  No thoracic, or lumbar spine midline TTP. TTP over cervical  midline spine.  TTP over right shoulder and right upper trapezius with ecchymoses present. Dec ROM of right shoulder due to reported pain, FROM of right elbow, forearm, wrist and hand. Full ROM of left upper and lower extremities with 5/5 strength.   No pelvic instability or TTP over bilateral hips.  2+ radial and PT pulses. Sensation grossly intact.   Neurological: She is alert and oriented to person, place, and time. She has normal strength. No cranial nerve deficit or sensory deficit. She displays a negative Romberg sign. Coordination normal.  Skin: Skin is warm and dry. Capillary refill takes less than 2 seconds. She is not diaphoretic.  Nursing note and vitals reviewed.    ED Treatments / Results  Labs (all labs ordered are listed, but only abnormal results are displayed) Labs Reviewed  CBC WITH DIFFERENTIAL/PLATELET - Abnormal; Notable for the following:       Result Value   WBC 11.4 (*)    Hemoglobin 11.5 (*)    HCT 34.8 (*)     Neutro Abs 9.2 (*)    All other components within normal limits  COMPREHENSIVE METABOLIC PANEL - Abnormal; Notable for the following:    Sodium 133 (*)    Chloride 96 (*)    Glucose, Bld 120 (*)    BUN 24 (*)    Creatinine, Ser 1.25 (*)    Albumin 3.4 (*)    GFR calc non Af Amer 36 (*)    GFR calc Af Amer 42 (*)    All other components within normal limits  I-STAT TROPOININ, ED - Abnormal; Notable for the following:    Troponin i, poc 0.16 (*)    All other components within normal limits  URINALYSIS, ROUTINE W REFLEX MICROSCOPIC (NOT AT [facility])  BRAIN NATRIURETIC PEPTIDE    EKG  EKG Interpretation  Date/Time:  Saturday December 29 2015 17:14:21 EDT Ventricular Rate:  88 PR Interval:    QRS Duration: 166 QT Interval:  400 QTC Calculation: 484 R Axis:   -80 Text Interpretation:  Atrial-sensed ventricular-paced rhythm No further analysis attempted due to paced rhythm Confirmed by Adriana Simas  MD, BRIAN ([id number]) on 12/29/2015 6:32:15 PM       Radiology Dg Chest 1 View  Result Date: 12/29/2015 CLINICAL DATA:  Right-sided rib and right shoulder pain after fall today. EXAM: CHEST 1 VIEW COMPARISON:  10/21/2015 FINDINGS: Left-sided pacemaker unchanged. Lungs are adequately inflated with mild stable interstitial prominence over the mid to lower lungs. Stable hazy opacification over the left base/ retrocardiac region. Mild stable cardiomegaly. Calcified plaque is present over the thoracic aorta. There are moderate degenerate changes of the spine. Stabilization hardware is partially visualized over the lower thoracic spine intact. Multiple old right rib fractures. Acute nondisplaced right posterior third rib fracture. Suggestion of an acute displaced distal right clavicle fracture. IMPRESSION: Stable left base opacification which may be due to effusions/atelectasis, although cannot exclude infection. Chronic interstitial prominence over the mid to lower lungs. Acute nondisplaced right posterior third  rib fracture. Suggestion of a displaced distal right clavicle fracture. Electronically Signed   By: Elberta Fortis M.D.   On: 12/29/2015 19:10   Dg Ribs Unilateral Right  Result Date: 12/29/2015 CLINICAL DATA:  Right-sided rib pain and right shoulder pain after fall today. EXAM: RIGHT RIBS - 2 VIEW COMPARISON:  Chest x-ray 10/21/2015 FINDINGS: Examination demonstrates suggestion of a distal right clavicle fracture. There is a non displaced fracture of the right posterior fourth rib and possibly the medial aspect  of the right posterior third rib. There are multiple old right-sided rib fractures. There are displaced lateral right third and fourth rib fractures. Remainder the exam is unchanged. IMPRESSION: Displaced right lateral third and fourth rib fractures and nondisplaced right posterior fourth rib fracture. Possible fracture along the posterior medial aspect of the right third rib. Suggestion a distal right clavicle fracture. Electronically Signed   By: Elberta Fortis M.D.   On: 12/29/2015 19:14   Dg Shoulder Right  Result Date: 12/29/2015 CLINICAL DATA:  Right rib and shoulder pain after fall today. EXAM: RIGHT SHOULDER - 2+ VIEW COMPARISON:  None. FINDINGS: There is a displaced acute distal right clavicle fracture. Evidence of patient's displaced lateral right third and fourth rib fractures. Suggestion of a posterior medial left second and third rib fracture. Degenerative change of the [facility] joint and glenohumeral joints. IMPRESSION: Displaced distal right clavicle fracture. Displaced right lateral third and fourth rib fractures. Suggestion of minimally displaced posterior medial right second and third rib fractures. Electronically Signed   By: Elberta Fortis M.D.   On: 12/29/2015 19:17   Ct Head Wo Contrast  Result Date: 12/29/2015 CLINICAL DATA:  Fall from toilet with laceration to right-sided head. Right rib and shoulder pain. EXAM: CT HEAD WITHOUT CONTRAST CT CERVICAL SPINE WITHOUT CONTRAST TECHNIQUE:  Multidetector CT imaging of the head and cervical spine was performed following the standard protocol without intravenous contrast. Multiplanar CT image reconstructions of the cervical spine were also generated. COMPARISON:  05/04/2015 FINDINGS: CT HEAD FINDINGS Brain: The ventricles and cisterns are within normal. There is minimal age related atrophic change present. There is chronic ischemic microvascular disease. There is no mass, mass effect, shift of midline structures or acute hemorrhage. No evidence of acute infarction. Vascular: Calcified plaque over the cavernous and petrous segments of the internal carotid arteries. Calcified plaque over the vertebral arteries. Skull: No acute fracture. Postsurgical change compatible previous left mastoidectomy with stable residual density over the surgical bed and middle ear cavity. Bone island over the clivus unchanged. Sinuses/Orbits: Paranasal sinuses are clear. Orbits are within normal. Other: Small right frontal temporal scalp contusion. CT CERVICAL SPINE FINDINGS Alignment: Straightening of the normal cervical lordosis unchanged. Skull base and vertebrae: Mild spondylosis throughout the cervical spine. No acute fracture or traumatic subluxation. Moderate uncovertebral joint spurring and facet arthropathy. Significant bilateral neural foraminal narrowing at multiple levels due to adjacent bony spurring. Soft tissues and spinal canal: Prevertebral soft tissues are within normal. Spinal canal is unremarkable. Disc levels: Disc space narrowing at the C5-6 level and to lesser extent at the C6-7 level unchanged. Upper chest: Minimal emphysematous disease. Suggestion of a nondisplaced fracture of the right posterior fourth rib. Other: IMPRESSION: No acute intracranial findings. Chronic ischemic microvascular disease and mild age related atrophic change. Small right frontotemporal scalp contusion. Stable postsurgical change of the right mastoid sinus. No acute cervical  spine injury. Spondylosis throughout the cervical spine with disc disease and multilevel neural foraminal narrowing as described. Suggestion a nondisplaced fracture of the right posterior fourth rib. Electronically Signed   By: Elberta Fortis M.D.   On: 12/29/2015 19:46   Ct Cervical Spine Wo Contrast  Result Date: 12/29/2015 CLINICAL DATA:  Fall from toilet with laceration to right-sided head. Right rib and shoulder pain. EXAM: CT HEAD WITHOUT CONTRAST CT CERVICAL SPINE WITHOUT CONTRAST TECHNIQUE: Multidetector CT imaging of the head and cervical spine was performed following the standard protocol without intravenous contrast. Multiplanar CT image reconstructions of the cervical spine were  also generated. COMPARISON:  05/04/2015 FINDINGS: CT HEAD FINDINGS Brain: The ventricles and cisterns are within normal. There is minimal age related atrophic change present. There is chronic ischemic microvascular disease. There is no mass, mass effect, shift of midline structures or acute hemorrhage. No evidence of acute infarction. Vascular: Calcified plaque over the cavernous and petrous segments of the internal carotid arteries. Calcified plaque over the vertebral arteries. Skull: No acute fracture. Postsurgical change compatible previous left mastoidectomy with stable residual density over the surgical bed and middle ear cavity. Bone island over the clivus unchanged. Sinuses/Orbits: Paranasal sinuses are clear. Orbits are within normal. Other: Small right frontal temporal scalp contusion. CT CERVICAL SPINE FINDINGS Alignment: Straightening of the normal cervical lordosis unchanged. Skull base and vertebrae: Mild spondylosis throughout the cervical spine. No acute fracture or traumatic subluxation. Moderate uncovertebral joint spurring and facet arthropathy. Significant bilateral neural foraminal narrowing at multiple levels due to adjacent bony spurring. Soft tissues and spinal canal: Prevertebral soft tissues are within  normal. Spinal canal is unremarkable. Disc levels: Disc space narrowing at the C5-6 level and to lesser extent at the C6-7 level unchanged. Upper chest: Minimal emphysematous disease. Suggestion of a nondisplaced fracture of the right posterior fourth rib. Other: IMPRESSION: No acute intracranial findings. Chronic ischemic microvascular disease and mild age related atrophic change. Small right frontotemporal scalp contusion. Stable postsurgical change of the right mastoid sinus. No acute cervical spine injury. Spondylosis throughout the cervical spine with disc disease and multilevel neural foraminal narrowing as described. Suggestion a nondisplaced fracture of the right posterior fourth rib. Electronically Signed   By: Elberta Fortis M.D.   On: 12/29/2015 19:46    Procedures Procedures (including critical care time)  Medications Ordered in ED Medications  acetaminophen (TYLENOL) tablet 650 mg (650 mg Oral Given 12/29/15 1923)  sodium chloride 0.9 % bolus 1,000 mL (0 mLs Intravenous Stopped 12/29/15 2058)  Tdap (BOOSTRIX) injection 0.5 mL (0.5 mLs Intramuscular Given 12/29/15 2045)     Initial Impression / Assessment and Plan / ED Course  I have reviewed the triage vital signs and the nursing notes.  Pertinent labs & imaging results that were available during my care of the patient were reviewed by me and considered in my medical decision making (see chart for details).  Clinical Course    Patient presents status post unwitnessed fall from nursing facility with reported right arm pain, chest pain and neck pain. Patient reports feeling dizzy after urinating all sitting on the toilet prior to falling on her right side. LOC unknown. VSS. Exam revealed hematoma to right parietal/temporal region, no active bleeding. Tenderness over right shoulder and right anterior and lateral chest wall. Lungs clear to auscultation bilaterally. No abdominal tenderness. Patient moving all 4 extremities spontaneously. No  neuro deficits. No other signs of injury or trauma.  EKG showed ventricular paced rhythm. Troponin 0.16. WBC 11.4. Na 133, CL 96, Cr 1.25. Remaining labs unremarkable. Urine pending. X-rays revealed displaced right distal clavicle fracture. Displaced right lateral third and fourth rib fractures and nondisplaced right posterior fourth rib fracture. CT head and neck unremarkable. Orthostatics positive. Pt given 1L IVF. Pending pacemaker (St. Jude) interrogation. Consulted hospitalist for admission. Discussed results and plan for admission with pt.   Final Clinical Impressions(s) / ED Diagnoses   Final diagnoses:  Rib fracture, right, closed, initial encounter  Right clavicle fracture, closed, initial encounter  Fall, initial encounter    New Prescriptions New Prescriptions   No medications on file  Satira Sark [place], [place] 12/29/15 2116    Donnetta Hutching, MD 12/29/15 [phone]

## 2015-12-29 NOTE — ED Notes (Signed)
Attempted to call family per patient request to let them know she is here.  No answer received.  Message left to call when available.

## 2015-12-29 NOTE — H&P (Signed)
History and Physical  Patient Name: Bethany Landry     RUE:[id number]    DOB: [birth year]    DOA: 12/29/2015 PCP: Clelia Schaumann, MD   Patient coming from: [facility]   Chief Complaint: Fall and right rib pain  HPI: Bethany Landry is a 82 y.o. female with a past medical history significant for CHF EF 20-25%, CKD III, hypothyroidism, and CHB with pacer who presents with fall and rib fractures.  The patient was in her usual state of health until tonight, she was sitting on the commode after going to the bathroom when she felt dizzy.  She stood up and held the bar in her room, but became more dizzy/spinning and passed out and fell.  The next thing she knew she was lying on the ground on her right side, having struck the vanity on the way down and was transported to the ER per protocol.  She had a scalp laceration and right sided chest pain, severe, worse with movement.  No substernal or left sided chest discomfort/pressure, no palpitations, SOB.  ED course: -Febrile, heart rate 80s to 100s, respirations 18-26, blood pressure 150/70, pulse oximetry normal -Na 133 (chronic), K 4.0, Cr 1.25 (baseline 1.1), WBC 11.4K, Hgb 11.5 baseline 12) -UA with scant pyuria -Troponin minimally elevated at 0.16 -Dedicated rib films showed displaced new lateral third and fourth rib fractures, nondisplaced posterior fourth fracture, possible posterior third fracture (in addition to old rib fractures). -Shoulder film showed a distal clavicle fracture -Her pacemaker was interrogated and showed no new events -Positive orthostatics  -She was given gentle fluids and TRH were asked to observe   She was admitted in January for syncope, found to be orthostatic, no arrhythmias on pacer interrogation, and pacer was reprogrammed with a higher rate and she was discharged.  In July she was admitted again, this time for new onset systolic CHF, diuresed about 4 L, and improved.  Lately, her breathing has been stable,  she had increased her lasix with Cardiology during August, but had stayed at the new regimen of furosemide 40 mg BID this month and seemed to have less dyspnea and be stable at her dry weight.  Her LV function had decreased from 40% in January to 20% in July.         ROS: Review of Systems  Constitutional: Negative for chills and fever.  Respiratory: Negative for shortness of breath.   Cardiovascular: Negative for chest pain, palpitations, leg swelling and PND.  Musculoskeletal: Positive for falls (with right sided chest wall and shoulder pain).  Neurological: Positive for dizziness (at time of event) and loss of consciousness.  All other systems reviewed and are negative.         Past Medical History:  Diagnosis Date  . Benign hypertension   . CKD (chronic kidney disease), stage III    moderate  . Complete heart block (HCC)   . DOE (dyspnea on exertion)   . Fungal infection    recurrent  . Hyperlipidemia   . Hypothyroidism   . Insomnia   . Mastoiditis    hearing loss, s/p mastoiectomy, right ear  . Osteoarthritis   . Osteoporosis   . Sinoatrial node dysfunction (HCC)     Past Surgical History:  Procedure Laterality Date  . BACK SURGERY    . CATARACT EXTRACTION    . INSERT / REPLACE / REMOVE PACEMAKER  11/26/09   [employer], DDD PM  . LAPAROTOMY     Ex lap for abdominal  pain (ischemic colitis)  . MASTOIDECTOMY Right    Tumor removal and residual recurrent fungal infection. Dr. Pollyann Kennedy  . THYROIDECTOMY      Social History: Patient lives at [facility].  The patient walks with a walker.  She is a remote former smoker.  From [place] originally.  Her son is Bethany Landry, from [facility].    Allergies  Allergen Reactions  . Norvasc [Amlodipine Besylate] Swelling    Swelling in ankles  . Quinapril Hcl Swelling  . Ace Inhibitors Swelling and Cough    Family history: family history includes CAD in her brother; Cerebral aneurysm in her brother; Heart  attack in her mother.  Prior to Admission medications   Medication Sig Start Date End Date Taking? Authorizing Provider  acetaminophen (TYLENOL) 500 MG tablet Take 500 mg by mouth 2 (two) times daily as needed for mild pain or moderate pain.   Yes Historical Provider, MD  aspirin 81 MG chewable tablet Chew 1 tablet (81 mg total) by mouth daily. 10/26/15  Yes Nishant Dhungel, MD  carvedilol (COREG) 3.125 MG tablet Take 1 tablet (3.125 mg total) by mouth 2 (two) times daily with a meal. 10/26/15  Yes Nishant Dhungel, MD  ENSURE PLUS (ENSURE PLUS) LIQD Take 237 mLs by mouth 2 (two) times daily as needed (for lack of appetite).   Yes Historical Provider, MD  furosemide (LASIX) 40 MG tablet Take 40 mg by mouth 2 (two) times daily.    Yes Historical Provider, MD  levothyroxine (SYNTHROID, LEVOTHROID) 125 MCG tablet Take 1 TABLET (125 mcg) BY MOUTH ONCE DAILY 11/17/14  Yes Historical Provider, MD  NON FORMULARY Take 240 mLs by mouth as needed (for constipation). Prune Juice    Yes Historical Provider, MD       Physical Exam: BP 168/60   Pulse 78   Temp 97.8 F (36.6 C)   Resp (!) 33   Ht 5\' 3"  (1.6 m)   Wt 53.1 kg (117 lb)   SpO2 97%   BMI 20.73 kg/m  General appearance: Frail elderly female, alert and in no acute distress.   Eyes: Anicteric, conjunctiva pink, lids and lashes normal. PERRL.    HENT: Laceration to right head.  No nasal deformity, discharge, epistaxis.  Hearing normal. OP moist without lesions, dentition good.   Neck: No neck masses.  Trachea midline.  No thyromegaly/tenderness. Lymph: No cervical or supraclavicular lymphadenopathy. Skin: Warm and dry.  No jaundice.  No suspicious rashes or lesions. Cardiac: Irregularly irregular, normal rate, nl S1-S2, no murmurs appreciated.  Capillary refill is brisk.  JVP normal.  No LE edema.  Radial and DP pulses 2+ and symmetric. Respiratory: Normal respiratory rate and rhythm.  CTAB without rales or wheezes. Abdomen: Abdomen soft.  No  TTP. No ascites, distension.   MSK: No deformities or effusions.  No cyanosis or clubbing.  There is pain to light palpation over right chest, and pain with movement. Neuro: Cranial nerves grossly normal.  Sensation intact to light touch. Speech is fluent.  Muscle strength globally weak, deconditioned.    Psych: Sensorium intact and responding to questions, attention normal.  Behavior appropriate.  Affect normal, pleasant.  Judgment and insight appear only slightly diminised by dementia.     Labs on Admission:  I have personally reviewed following labs and imaging studies: CBC:  Recent Labs Lab 12/29/15 1748  WBC 11.4*  NEUTROABS 9.2*  HGB 11.5*  HCT 34.8*  MCV 89.7  PLT 250   Basic Metabolic Panel:  Recent Labs Lab 12/29/15 1748  NA 133*  K 4.0  CL 96*  CO2 28  GLUCOSE 120*  BUN 24*  CREATININE 1.25*  CALCIUM 9.0   GFR: Estimated Creatinine Clearance: 23.3 mL/min (by C-G formula based on SCr of 1.25 mg/dL (H)).  Liver Function Tests:  Recent Labs Lab 12/29/15 1748  AST 21  ALT 15  ALKPHOS 57  BILITOT 0.4  PROT 6.9  ALBUMIN 3.4*         Radiological Exams on Admission: Personally reviewed chest x-ray shows stable old interstitial markings, no focal opacity.  Other below reports were reviewed: Dg Chest 1 View  Result Date: 12/29/2015 CLINICAL DATA:  Right-sided rib and right shoulder pain after fall today. EXAM: CHEST 1 VIEW COMPARISON:  10/21/2015 FINDINGS: Left-sided pacemaker unchanged. Lungs are adequately inflated with mild stable interstitial prominence over the mid to lower lungs. Stable hazy opacification over the left base/ retrocardiac region. Mild stable cardiomegaly. Calcified plaque is present over the thoracic aorta. There are moderate degenerate changes of the spine. Stabilization hardware is partially visualized over the lower thoracic spine intact. Multiple old right rib fractures. Acute nondisplaced right posterior third rib fracture.  Suggestion of an acute displaced distal right clavicle fracture. IMPRESSION: Stable left base opacification which may be due to effusions/atelectasis, although cannot exclude infection. Chronic interstitial prominence over the mid to lower lungs. Acute nondisplaced right posterior third rib fracture. Suggestion of a displaced distal right clavicle fracture. Electronically Signed   By: Elberta Fortis M.D.   On: 12/29/2015 19:10   Dg Ribs Unilateral Right  Result Date: 12/29/2015 CLINICAL DATA:  Right-sided rib pain and right shoulder pain after fall today. EXAM: RIGHT RIBS - 2 VIEW COMPARISON:  Chest x-ray 10/21/2015 FINDINGS: Examination demonstrates suggestion of a distal right clavicle fracture. There is a non displaced fracture of the right posterior fourth rib and possibly the medial aspect of the right posterior third rib. There are multiple old right-sided rib fractures. There are displaced lateral right third and fourth rib fractures. Remainder the exam is unchanged. IMPRESSION: Displaced right lateral third and fourth rib fractures and nondisplaced right posterior fourth rib fracture. Possible fracture along the posterior medial aspect of the right third rib. Suggestion a distal right clavicle fracture. Electronically Signed   By: Elberta Fortis M.D.   On: 12/29/2015 19:14   Dg Shoulder Right  Result Date: 12/29/2015 CLINICAL DATA:  Right rib and shoulder pain after fall today. EXAM: RIGHT SHOULDER - 2+ VIEW COMPARISON:  None. FINDINGS: There is a displaced acute distal right clavicle fracture. Evidence of patient's displaced lateral right third and fourth rib fractures. Suggestion of a posterior medial left second and third rib fracture. Degenerative change of the [facility] joint and glenohumeral joints. IMPRESSION: Displaced distal right clavicle fracture. Displaced right lateral third and fourth rib fractures. Suggestion of minimally displaced posterior medial right second and third rib fractures.  Electronically Signed   By: Elberta Fortis M.D.   On: 12/29/2015 19:17   Ct Head Wo Contrast  Result Date: 12/29/2015 CLINICAL DATA:  Fall from toilet with laceration to right-sided head. Right rib and shoulder pain. EXAM: CT HEAD WITHOUT CONTRAST CT CERVICAL SPINE WITHOUT CONTRAST TECHNIQUE: Multidetector CT imaging of the head and cervical spine was performed following the standard protocol without intravenous contrast. Multiplanar CT image reconstructions of the cervical spine were also generated. COMPARISON:  05/04/2015 FINDINGS: CT HEAD FINDINGS Brain: The ventricles and cisterns are within normal. There is minimal age related atrophic  change present. There is chronic ischemic microvascular disease. There is no mass, mass effect, shift of midline structures or acute hemorrhage. No evidence of acute infarction. Vascular: Calcified plaque over the cavernous and petrous segments of the internal carotid arteries. Calcified plaque over the vertebral arteries. Skull: No acute fracture. Postsurgical change compatible previous left mastoidectomy with stable residual density over the surgical bed and middle ear cavity. Bone island over the clivus unchanged. Sinuses/Orbits: Paranasal sinuses are clear. Orbits are within normal. Other: Small right frontal temporal scalp contusion. CT CERVICAL SPINE FINDINGS Alignment: Straightening of the normal cervical lordosis unchanged. Skull base and vertebrae: Mild spondylosis throughout the cervical spine. No acute fracture or traumatic subluxation. Moderate uncovertebral joint spurring and facet arthropathy. Significant bilateral neural foraminal narrowing at multiple levels due to adjacent bony spurring. Soft tissues and spinal canal: Prevertebral soft tissues are within normal. Spinal canal is unremarkable. Disc levels: Disc space narrowing at the C5-6 level and to lesser extent at the C6-7 level unchanged. Upper chest: Minimal emphysematous disease. Suggestion of a  nondisplaced fracture of the right posterior fourth rib. Other: IMPRESSION: No acute intracranial findings. Chronic ischemic microvascular disease and mild age related atrophic change. Small right frontotemporal scalp contusion. Stable postsurgical change of the right mastoid sinus. No acute cervical spine injury. Spondylosis throughout the cervical spine with disc disease and multilevel neural foraminal narrowing as described. Suggestion a nondisplaced fracture of the right posterior fourth rib. Electronically Signed   By: Elberta Fortis M.D.   On: 12/29/2015 19:46   Ct Cervical Spine Wo Contrast  Result Date: 12/29/2015 CLINICAL DATA:  Fall from toilet with laceration to right-sided head. Right rib and shoulder pain. EXAM: CT HEAD WITHOUT CONTRAST CT CERVICAL SPINE WITHOUT CONTRAST TECHNIQUE: Multidetector CT imaging of the head and cervical spine was performed following the standard protocol without intravenous contrast. Multiplanar CT image reconstructions of the cervical spine were also generated. COMPARISON:  05/04/2015 FINDINGS: CT HEAD FINDINGS Brain: The ventricles and cisterns are within normal. There is minimal age related atrophic change present. There is chronic ischemic microvascular disease. There is no mass, mass effect, shift of midline structures or acute hemorrhage. No evidence of acute infarction. Vascular: Calcified plaque over the cavernous and petrous segments of the internal carotid arteries. Calcified plaque over the vertebral arteries. Skull: No acute fracture. Postsurgical change compatible previous left mastoidectomy with stable residual density over the surgical bed and middle ear cavity. Bone island over the clivus unchanged. Sinuses/Orbits: Paranasal sinuses are clear. Orbits are within normal. Other: Small right frontal temporal scalp contusion. CT CERVICAL SPINE FINDINGS Alignment: Straightening of the normal cervical lordosis unchanged. Skull base and vertebrae: Mild spondylosis  throughout the cervical spine. No acute fracture or traumatic subluxation. Moderate uncovertebral joint spurring and facet arthropathy. Significant bilateral neural foraminal narrowing at multiple levels due to adjacent bony spurring. Soft tissues and spinal canal: Prevertebral soft tissues are within normal. Spinal canal is unremarkable. Disc levels: Disc space narrowing at the C5-6 level and to lesser extent at the C6-7 level unchanged. Upper chest: Minimal emphysematous disease. Suggestion of a nondisplaced fracture of the right posterior fourth rib. Other: IMPRESSION: No acute intracranial findings. Chronic ischemic microvascular disease and mild age related atrophic change. Small right frontotemporal scalp contusion. Stable postsurgical change of the right mastoid sinus. No acute cervical spine injury. Spondylosis throughout the cervical spine with disc disease and multilevel neural foraminal narrowing as described. Suggestion a nondisplaced fracture of the right posterior fourth rib. Electronically Signed  By: Elberta Fortis M.D.   On: 12/29/2015 19:46    EKG: Independently reviewed. Rate 88, atrial sensed paced rhythm, same from previous.  Echocardiogram July 2017 Systolic function was severely reduced.  EF 25% to 30%.  Diffuse hypokinesis.  Grade 2 diastolic dysfunction Moderate AR   Echocardiogram January 2017: Systolic function was mildly reduced. The estimated ejection fraction was in the range of 45% to 50%. Mild diffuse hypokinesis with no identifiable regional variations. Ventricular septum: Septal motion showed paradox. These changes are consistent with right ventricular pacing. Aortic valve: There was mild to moderate regurgitation directed centrally in the LVOT.        Assessment/Plan 1. Rib fractures and clavicle fracture:  Lateral 3-4 rib fractures new.  Posterior non-displaced rib fractures as well.  Right clavicle fracture.  Old rib fractures also noted.  Not on  anticoagulation -Consult to Trauma -Repeat CXR tomorrow -Acetaminophen scheduled TID -Oxycodone 2.5-5 mg PRN or hydromorphone PRN for pain -Ondansetron for nausea -Supplemental O2 as needed    2. Syncope:  Pacer interrogation normal.  Orthostatic vitals showed drop from 160 --> 120 with standing. -Gentle fluids and hold tomorrow's Lasix, then restart per Cardiology  3. Elevated troponin:  Unclear etiology.  No chest pain present.  ACS not immediately suspected. -Monitor on telemetry -Trend troponins -Consult to Cardiology, appreciate cares  4. HTN:  -Continue carvedilol  5. CKD:  Stable  6. Chronic hyponatremia:  Stable  7. Chronic systolic CHF:  EF 20-25% in July.  Appears at baseline weight and no dyspnea or peripheral edema. -Hold Lasix tomorrow as noted, restart 9/18 or per Cardiology  8. Hypothyroidism: -Continue levothyroxine  9. Chronic protein-calorie malnutrition, severe: -Continue Ensure supplements      DVT prophylaxis: SCDs   Code Status: DO NOT RESUSCITATE  Family Communication: Son, Sharlot Gowda by phone.  Status updated, overnight plan discussed, CODE STATUS confirmed.  Disposition Plan: Anticipate pain control and trauma evaluation.   Consults called: Trauma, Cardiology Admission status: INPATIENT, telemetry      Medical decision making: Patient seen at 8:50 PM on 12/29/2015.  The patient was discussed with Melburn Hake PA-C, Dr. Corliss Skains and Dr. Charlestine Night.  What exists of the patient's chart was reviewed in depth and summarized above.  Clinical condition: stable.        Alberteen Sam [facility] Pager [phone]     At the time of admission, it appears that the appropriate admission status for this patient is INPATIENT. This is judged to be reasonable and necessary in order to provide the required intensity of service to ensure the patient's safety given the presenting symptoms, physical exam findings, and initial radiographic and  laboratory data in the context of their chronic comorbidities.  Together, these circumstances are felt to place her/him at high risk for further clinical deterioration threatening life, limb, or organ. The following factors support the admission status of inpatient:   A. The patient's presenting symptoms include dizziness, syncope, fall, chest and shoulder pain B. The worrisome physical exam findings include tenderness to palpation on right chest, tachycardia, tachpynea C. The initial radiographic and laboratory data are worrisome because of multiple rib fractures. D. The chronic co-morbidities include congestive heart failure with systolic function reduced EF 20%, chronic kidneydisease, pacemaker, super advanced age E. Patient requires inpatient status due to high intensity of service, high risk for further deterioration and high frequency of surveillance required because of this acute illness that poses a threat to life or bodily function. F. I certify  that at the point of admission it is my clinical judgment that the patient will require inpatient hospital care spanning beyond 2 midnights from the point of admission.

## 2015-12-29 NOTE — ED Notes (Signed)
[facility] called with pacemaker report.  Reports no issues.  Provider notified.

## 2015-12-29 NOTE — ED Notes (Signed)
PT TO XRAY

## 2015-12-29 NOTE — ED Notes (Signed)
Interrogated left chest [employer] device.

## 2015-12-29 NOTE — ED Notes (Signed)
Paged [employer] regarding pacemaker interrogation report.  To email to me at this time.

## 2015-12-29 NOTE — ED Notes (Signed)
THE PT IS VERY HARD OF HEARING  SMALL CUT UNDER A SWELLING IN HER RT SCALP  CUT CLEANED WITH SOAP AND WATER.  BLEEDING CONTROLED AT PRESENT

## 2015-12-29 NOTE — Progress Notes (Signed)
Orthopedic Tech Progress Note Patient Details:  Vernia BuffDorothy Rocchio 1920/12/18 [id number]  Ortho Devices Type of Ortho Device: Sling immobilizer Ortho Device/Splint Interventions: Application   Saul FordyceJennifer C Yostin Malacara 12/29/2015, 11:28 PM

## 2015-12-29 NOTE — ED Triage Notes (Signed)
The npt was brought in by [facility] from [facility].  She was sitting on the toilet and cant remember what happened but woke up lying in the floor    Pain rt wrist hematoma rt side of her head iv per ems  Hx pacemaKER

## 2015-12-30 ENCOUNTER — Observation Stay (HOSPITAL_COMMUNITY): Payer: Medicare Other

## 2015-12-30 DIAGNOSIS — I442 Atrioventricular block, complete: Secondary | ICD-10-CM

## 2015-12-30 DIAGNOSIS — S2241XA Multiple fractures of ribs, right side, initial encounter for closed fracture: Secondary | ICD-10-CM | POA: Diagnosis not present

## 2015-12-30 DIAGNOSIS — S42031A Displaced fracture of lateral end of right clavicle, initial encounter for closed fracture: Secondary | ICD-10-CM | POA: Diagnosis not present

## 2015-12-30 DIAGNOSIS — I5022 Chronic systolic (congestive) heart failure: Secondary | ICD-10-CM | POA: Diagnosis not present

## 2015-12-30 DIAGNOSIS — N183 Chronic kidney disease, stage 3 (moderate): Secondary | ICD-10-CM | POA: Diagnosis not present

## 2015-12-30 LAB — BASIC METABOLIC PANEL
ANION GAP: 10 (ref 5–15)
BUN: 22 mg/dL — ABNORMAL HIGH (ref 6–20)
CALCIUM: 8.7 mg/dL — AB (ref 8.9–10.3)
CO2: 27 mmol/L (ref 22–32)
CREATININE: 1.11 mg/dL — AB (ref 0.44–1.00)
Chloride: 99 mmol/L — ABNORMAL LOW (ref 101–111)
GFR, EST AFRICAN AMERICAN: 48 mL/min — AB (ref >60)
GFR, EST NON AFRICAN AMERICAN: 41 mL/min — AB (ref >60)
Glucose, Bld: 125 mg/dL — ABNORMAL HIGH (ref 65–99)
Potassium: 3.3 mmol/L — ABNORMAL LOW (ref 3.5–5.1)
SODIUM: 136 mmol/L (ref 135–145)

## 2015-12-30 LAB — CBC
HCT: 30.4 % — ABNORMAL LOW (ref 36.0–46.0)
Hemoglobin: 9.9 g/dL — ABNORMAL LOW (ref 12.0–15.0)
MCH: 28.7 pg (ref 26.0–34.0)
MCHC: 32.6 g/dL (ref 30.0–36.0)
MCV: 88.1 fL (ref 78.0–100.0)
PLATELETS: 195 10*3/uL (ref 150–400)
RBC: 3.45 MIL/uL — AB (ref 3.87–5.11)
RDW: 13.9 % (ref 11.5–15.5)
WBC: 9.2 10*3/uL (ref 4.0–10.5)

## 2015-12-30 LAB — TROPONIN I
TROPONIN I: 0.18 ng/mL — AB (ref <0.03)
Troponin I: 0.14 ng/mL (ref <0.03)

## 2015-12-30 LAB — GLUCOSE, CAPILLARY
Glucose-Capillary: 105 mg/dL — ABNORMAL HIGH (ref 65–99)
Glucose-Capillary: 137 mg/dL — ABNORMAL HIGH (ref 65–99)

## 2015-12-30 LAB — MAGNESIUM: Magnesium: 1.5 mg/dL — ABNORMAL LOW (ref 1.7–2.4)

## 2015-12-30 LAB — TSH: TSH: 0.053 u[IU]/mL — AB (ref 0.350–4.500)

## 2015-12-30 LAB — MRSA PCR SCREENING: MRSA BY PCR: NEGATIVE

## 2015-12-30 MED ORDER — TRAMADOL HCL 50 MG PO TABS: 100.0000 mg | ORAL_TABLET | Freq: Two times a day (BID) | ORAL | Status: DC | PRN

## 2015-12-30 MED ORDER — MAGNESIUM SULFATE 2 GM/50ML IV SOLN
2.0000 g | Freq: Once | INTRAVENOUS | Status: AC
  Administered 2015-12-30: 2 g via INTRAVENOUS
  Filled 2015-12-30: qty 50

## 2015-12-30 MED ORDER — ATORVASTATIN CALCIUM 80 MG PO TABS
80.0000 mg | ORAL_TABLET | Freq: Once | ORAL | Status: AC
  Administered 2015-12-30: 80 mg via ORAL
  Filled 2015-12-30: qty 1

## 2015-12-30 MED ORDER — ASPIRIN EC 81 MG PO TBEC
81.0000 mg | DELAYED_RELEASE_TABLET | Freq: Once | ORAL | Status: AC
  Administered 2015-12-30: 81 mg via ORAL
  Filled 2015-12-30: qty 1

## 2015-12-30 MED ORDER — HEPARIN SODIUM (PORCINE) 5000 UNIT/ML IJ SOLN
5000.0000 [IU] | Freq: Three times a day (TID) | INTRAMUSCULAR | Status: DC
Start: 2015-12-30 — End: 2015-12-31
  Administered 2015-12-30 (×2): 5000 [IU] via SUBCUTANEOUS
  Filled 2015-12-30 (×2): qty 1

## 2015-12-30 MED ORDER — POTASSIUM CHLORIDE CRYS ER 20 MEQ PO TBCR
40.0000 meq | EXTENDED_RELEASE_TABLET | Freq: Once | ORAL | Status: AC
  Administered 2015-12-30: 40 meq via ORAL
  Filled 2015-12-30: qty 2

## 2015-12-30 MED ORDER — POTASSIUM CHLORIDE IN NACL 40-0.9 MEQ/L-% IV SOLN: INTRAVENOUS | Status: DC

## 2015-12-30 NOTE — Consult Note (Signed)
ORTHOPAEDIC CONSULTATION  REQUESTING PHYSICIAN: Richarda OverlieNayana Abrol, MD  Chief Complaint: Right shoulder and right rib pain  HPI: Bethany Landry is a 82 y.o. female who presents with right nondisplaced clavicle fracture and multiple right rib fractures. Patient had a ground-level fall sustaining the above-mentioned injuries.  Past Medical History:  Diagnosis Date  . Benign hypertension   . CKD (chronic kidney disease), stage III    moderate  . Complete heart block (HCC)   . DOE (dyspnea on exertion)   . Fungal infection    recurrent  . Hyperlipidemia   . Hypothyroidism   . Insomnia   . Mastoiditis    hearing loss, s/p mastoiectomy, right ear  . Osteoarthritis   . Osteoporosis   . Sinoatrial node dysfunction (HCC)    Past Surgical History:  Procedure Laterality Date  . BACK SURGERY    . CATARACT EXTRACTION    . INSERT / REPLACE / REMOVE PACEMAKER  11/26/09   [employer], DDD PM  . LAPAROTOMY     Ex lap for abdominal pain (ischemic colitis)  . MASTOIDECTOMY Right    Tumor removal and residual recurrent fungal infection. Dr. Pollyann Kennedyosen  . THYROIDECTOMY     Social History   Social History  . Marital status: Divorced    Spouse name: N/A  . Number of children: N/A  . Years of education: N/A   Social History Main Topics  . Smoking status: Former Smoker    Types: Cigarettes    Quit date: 04/14/1982  . Smokeless tobacco: Never Used  . Alcohol use Yes     Comment: wine, occasionally  . Drug use: No  . Sexual activity: Not Asked   Other Topics Concern  . None   Social History Narrative  . None   Family History  Problem Relation Age of Onset  . CAD Brother   . Cerebral aneurysm Brother   . Heart attack Mother    - negative except otherwise stated in the family history section Allergies  Allergen Reactions  . Norvasc [Amlodipine Besylate] Swelling    Swelling in ankles  . Quinapril Hcl Swelling  . Ace Inhibitors Swelling and Cough   Prior to Admission medications     Medication Sig Start Date End Date Taking? Authorizing Provider  acetaminophen (TYLENOL) 500 MG tablet Take 500 mg by mouth 2 (two) times daily as needed for mild pain or moderate pain.   Yes Historical Provider, MD  aspirin 81 MG chewable tablet Chew 1 tablet (81 mg total) by mouth daily. 10/26/15  Yes Nishant Dhungel, MD  carvedilol (COREG) 3.125 MG tablet Take 1 tablet (3.125 mg total) by mouth 2 (two) times daily with a meal. 10/26/15  Yes Nishant Dhungel, MD  ENSURE PLUS (ENSURE PLUS) LIQD Take 237 mLs by mouth 2 (two) times daily as needed (for lack of appetite).   Yes Historical Provider, MD  furosemide (LASIX) 40 MG tablet Take 40 mg by mouth 2 (two) times daily.    Yes Historical Provider, MD  levothyroxine (SYNTHROID, LEVOTHROID) 125 MCG tablet Take 1 TABLET (125 mcg) BY MOUTH ONCE DAILY 11/17/14  Yes Historical Provider, MD  NON FORMULARY Take 240 mLs by mouth as needed (for constipation). Prune Juice    Yes Historical Provider, MD   Dg Chest 1 View  Result Date: 12/29/2015 CLINICAL DATA:  Right-sided rib and right shoulder pain after fall today. EXAM: CHEST 1 VIEW COMPARISON:  10/21/2015 FINDINGS: Left-sided pacemaker unchanged. Lungs are adequately inflated with mild stable  interstitial prominence over the mid to lower lungs. Stable hazy opacification over the left base/ retrocardiac region. Mild stable cardiomegaly. Calcified plaque is present over the thoracic aorta. There are moderate degenerate changes of the spine. Stabilization hardware is partially visualized over the lower thoracic spine intact. Multiple old right rib fractures. Acute nondisplaced right posterior third rib fracture. Suggestion of an acute displaced distal right clavicle fracture. IMPRESSION: Stable left base opacification which may be due to effusions/atelectasis, although cannot exclude infection. Chronic interstitial prominence over the mid to lower lungs. Acute nondisplaced right posterior third rib fracture.  Suggestion of a displaced distal right clavicle fracture. Electronically Signed   By: Elberta Fortis M.D.   On: 12/29/2015 19:10   Dg Ribs Unilateral Right  Result Date: 12/29/2015 CLINICAL DATA:  Right-sided rib pain and right shoulder pain after fall today. EXAM: RIGHT RIBS - 2 VIEW COMPARISON:  Chest x-ray 10/21/2015 FINDINGS: Examination demonstrates suggestion of a distal right clavicle fracture. There is a non displaced fracture of the right posterior fourth rib and possibly the medial aspect of the right posterior third rib. There are multiple old right-sided rib fractures. There are displaced lateral right third and fourth rib fractures. Remainder the exam is unchanged. IMPRESSION: Displaced right lateral third and fourth rib fractures and nondisplaced right posterior fourth rib fracture. Possible fracture along the posterior medial aspect of the right third rib. Suggestion a distal right clavicle fracture. Electronically Signed   By: Elberta Fortis M.D.   On: 12/29/2015 19:14   Dg Shoulder Right  Result Date: 12/29/2015 CLINICAL DATA:  Right rib and shoulder pain after fall today. EXAM: RIGHT SHOULDER - 2+ VIEW COMPARISON:  None. FINDINGS: There is a displaced acute distal right clavicle fracture. Evidence of patient's displaced lateral right third and fourth rib fractures. Suggestion of a posterior medial left second and third rib fracture. Degenerative change of the [facility] joint and glenohumeral joints. IMPRESSION: Displaced distal right clavicle fracture. Displaced right lateral third and fourth rib fractures. Suggestion of minimally displaced posterior medial right second and third rib fractures. Electronically Signed   By: Elberta Fortis M.D.   On: 12/29/2015 19:17   Ct Head Wo Contrast  Result Date: 12/29/2015 CLINICAL DATA:  Fall from toilet with laceration to right-sided head. Right rib and shoulder pain. EXAM: CT HEAD WITHOUT CONTRAST CT CERVICAL SPINE WITHOUT CONTRAST TECHNIQUE: Multidetector  CT imaging of the head and cervical spine was performed following the standard protocol without intravenous contrast. Multiplanar CT image reconstructions of the cervical spine were also generated. COMPARISON:  05/04/2015 FINDINGS: CT HEAD FINDINGS Brain: The ventricles and cisterns are within normal. There is minimal age related atrophic change present. There is chronic ischemic microvascular disease. There is no mass, mass effect, shift of midline structures or acute hemorrhage. No evidence of acute infarction. Vascular: Calcified plaque over the cavernous and petrous segments of the internal carotid arteries. Calcified plaque over the vertebral arteries. Skull: No acute fracture. Postsurgical change compatible previous left mastoidectomy with stable residual density over the surgical bed and middle ear cavity. Bone island over the clivus unchanged. Sinuses/Orbits: Paranasal sinuses are clear. Orbits are within normal. Other: Small right frontal temporal scalp contusion. CT CERVICAL SPINE FINDINGS Alignment: Straightening of the normal cervical lordosis unchanged. Skull base and vertebrae: Mild spondylosis throughout the cervical spine. No acute fracture or traumatic subluxation. Moderate uncovertebral joint spurring and facet arthropathy. Significant bilateral neural foraminal narrowing at multiple levels due to adjacent bony spurring. Soft tissues and spinal canal:  Prevertebral soft tissues are within normal. Spinal canal is unremarkable. Disc levels: Disc space narrowing at the C5-6 level and to lesser extent at the C6-7 level unchanged. Upper chest: Minimal emphysematous disease. Suggestion of a nondisplaced fracture of the right posterior fourth rib. Other: IMPRESSION: No acute intracranial findings. Chronic ischemic microvascular disease and mild age related atrophic change. Small right frontotemporal scalp contusion. Stable postsurgical change of the right mastoid sinus. No acute cervical spine injury.  Spondylosis throughout the cervical spine with disc disease and multilevel neural foraminal narrowing as described. Suggestion a nondisplaced fracture of the right posterior fourth rib. Electronically Signed   By: Elberta Fortis M.D.   On: 12/29/2015 19:46   Ct Cervical Spine Wo Contrast  Result Date: 12/29/2015 CLINICAL DATA:  Fall from toilet with laceration to right-sided head. Right rib and shoulder pain. EXAM: CT HEAD WITHOUT CONTRAST CT CERVICAL SPINE WITHOUT CONTRAST TECHNIQUE: Multidetector CT imaging of the head and cervical spine was performed following the standard protocol without intravenous contrast. Multiplanar CT image reconstructions of the cervical spine were also generated. COMPARISON:  05/04/2015 FINDINGS: CT HEAD FINDINGS Brain: The ventricles and cisterns are within normal. There is minimal age related atrophic change present. There is chronic ischemic microvascular disease. There is no mass, mass effect, shift of midline structures or acute hemorrhage. No evidence of acute infarction. Vascular: Calcified plaque over the cavernous and petrous segments of the internal carotid arteries. Calcified plaque over the vertebral arteries. Skull: No acute fracture. Postsurgical change compatible previous left mastoidectomy with stable residual density over the surgical bed and middle ear cavity. Bone island over the clivus unchanged. Sinuses/Orbits: Paranasal sinuses are clear. Orbits are within normal. Other: Small right frontal temporal scalp contusion. CT CERVICAL SPINE FINDINGS Alignment: Straightening of the normal cervical lordosis unchanged. Skull base and vertebrae: Mild spondylosis throughout the cervical spine. No acute fracture or traumatic subluxation. Moderate uncovertebral joint spurring and facet arthropathy. Significant bilateral neural foraminal narrowing at multiple levels due to adjacent bony spurring. Soft tissues and spinal canal: Prevertebral soft tissues are within normal.  Spinal canal is unremarkable. Disc levels: Disc space narrowing at the C5-6 level and to lesser extent at the C6-7 level unchanged. Upper chest: Minimal emphysematous disease. Suggestion of a nondisplaced fracture of the right posterior fourth rib. Other: IMPRESSION: No acute intracranial findings. Chronic ischemic microvascular disease and mild age related atrophic change. Small right frontotemporal scalp contusion. Stable postsurgical change of the right mastoid sinus. No acute cervical spine injury. Spondylosis throughout the cervical spine with disc disease and multilevel neural foraminal narrowing as described. Suggestion a nondisplaced fracture of the right posterior fourth rib. Electronically Signed   By: Elberta Fortis M.D.   On: 12/29/2015 19:46   - pertinent xrays, CT, MRI studies were reviewed and independently interpreted  Positive ROS: All other systems have been reviewed and were otherwise negative with the exception of those mentioned in the HPI and as above.  Physical Exam: General: Alert, no acute distress Psychiatric: Patient is competent for consent with normal mood and affect Lymphatic: No axillary or cervical lymphadenopathy Cardiovascular: No pedal edema Respiratory: No cyanosis, no use of accessory musculature GI: No organomegaly, abdomen is soft and non-tender  Skin: Patient has some ecchymosis and bruising in the right upper extremity.   Neurologic: Patient has protective sensation bilateral lower extremities.   MUSCULOSKELETAL:  Examination right upper extremity she is neurovascularly intact. There is no tenting the skin and no open wounds. Review of the radiographs shows  a nondisplaced right distal clavicle fracture as well as multiple rib fractures that are acute and multiple rib fractures that are chronic.  Assessment: Assessment: Acute right clavicle fracture with acute right rib fractures and old healed right rib fractures.  Plan: Plan: She may use the sling  for comfort she may use the right upper extremity as she is comfortable. There is no indication for surgical intervention I will follow-up in the office in 2 weeks.  Thank you for the consult and the opportunity to see Ms. Malka So, MD [facility] [phone] 2:40 PM

## 2015-12-30 NOTE — Evaluation (Signed)
Physical Therapy Evaluation Patient Details Name: Bethany Landry MRN: [id number] DOB: 1920/11/27 Today's Date: 12/30/2015   History of Present Illness  HPI: 82 yo female with an extensive past medical history presents after syncopal episode.  She was sitting on the commode at her assisted living facility when she felt dizzy and fell to her right side.  Apparently, she landed on her right side, striking the vanity. L 3rd and 4th lateral rib fxs, distal clavicle fx  Clinical Impression   Pt admitted with above diagnosis. Pt currently with functional limitations due to the deficits listed below (see PT Problem List). Presents with decr activity tolerance limited by postural hypotension; painful R shoulder and ribs;  Pt will benefit from skilled PT to increase their independence and safety with mobility to allow discharge to the venue listed below.       Follow Up Recommendations SNF;Other (comment) (While she will benefit from SNF for rehab, she is obs status, and will likely not qualify for SNF through medicare rules; would like then for HHPT at ALF, and consider ramping up services)    Equipment Recommendations  None recommended by PT    Recommendations for Other Services OT consult     Precautions / Restrictions Precautions Precautions: Fall Precaution Comments: Orthostatic hypotension      Mobility  Bed Mobility Overal bed mobility: Needs Assistance Bed Mobility: Supine to Sit     Supine to sit: Mod assist     General bed mobility comments: Cues to initiate and for technique; mod assist to elevate trunk to sit  Transfers Overall transfer level: Needs assistance Equipment used: Rolling walker (2 wheeled);1 person hand held assist Transfers: Sit to/from [employer]Stand;Stand Pivot Transfers Sit to Stand: Mod assist Stand pivot transfers: Min assist       General transfer comment: Mod assist to power up the stand, assist provided at L side/shoulder girdle; able to take pivot steps  bed to chair  Ambulation/Gait             General Gait Details: Deferred secondary to low BP response to standing  Stairs            Wheelchair Mobility    Modified Rankin (Stroke Patients Only)       Balance                                             Pertinent Vitals/Pain Pain Assessment: Faces Faces Pain Scale: Hurts little more Pain Location: R shoulder and ribs with mobiltiy Pain Descriptors / Indicators: Aching;Grimacing;Discomfort;Guarding Pain Intervention(s): Limited activity within patient's tolerance;Monitored during session;Repositioned    Home Living Family/patient expects to be discharged to:: Assisted living               Home Equipment: Dan HumphreysWalker - 2 wheels Additional Comments: Will need more information re: the exact level [facility] at [facility]; is it an option to amp up services?    Prior Function Level of Independence: Independent with assistive device(s)         Comments: REports she walks to dining room with RW; needs help occasionally with that walk; will need to find out if she has a bath aide     Hand Dominance        Extremity/Trunk Assessment   Upper Extremity Assessment: RUE deficits/detail RUE Deficits / Details: Did not move R shoulder much  due to anticipation of pain RUE: Unable to fully assess due to pain       Lower Extremity Assessment: Generalized weakness         Communication   Communication: HOH  Cognition Arousal/Alertness: Awake/alert Behavior During Therapy: WFL for tasks assessed/performed Overall Cognitive Status: Within Functional Limits for tasks assessed (for simple mobility; noted some short-term memory issues)       Memory: Decreased short-term memory              General Comments General comments (skin integrity, edema, etc.): Noted a significant drop in BP between sitting and standing; dizziness, nausea, and vomiting during and after standing  trials; BP sitting 147/67; BP after standing trial (could not stand long enough for BP to read while standing) 103/68, HR 90; BP once reclined with feet up in chair 159/62    Exercises     Assessment/Plan    PT Assessment Patient needs continued PT services  PT Problem List Decreased strength;Decreased range of motion;Decreased activity tolerance;Decreased balance;Decreased mobility;Decreased coordination;Decreased cognition;Decreased knowledge of use of DME;Decreased safety awareness;Cardiopulmonary status limiting activity;Pain          PT Treatment Interventions DME instruction;Gait training;Functional mobility training;Therapeutic activities;Therapeutic exercise;Balance training;Neuromuscular re-education;Patient/family education;Cognitive remediation    PT Goals (Current goals can be found in the Care Plan section)  Acute Rehab PT Goals Patient Stated Goal: did not state PT Goal Formulation: With patient Time For Goal Achievement: 01/13/16 Potential to Achieve Goals: Good    Frequency Min 3X/week   Barriers to discharge Decreased caregiver support Will need to know how much assist is available to her at [facility]    Co-evaluation               End of Session Equipment Utilized During Treatment: Gait belt Activity Tolerance: Other (comment) (limited by postureal hypotension) Patient left: in chair;with call bell/phone within reach;with chair alarm set Nurse Communication: Mobility status    Functional Assessment Tool Used: Clinical Judgement Functional Limitation: Mobility: Walking and moving around Mobility: Walking and Moving Around Current Status [phone]): At least 20 percent but less than 40 percent impaired, limited or restricted Mobility: Walking and Moving Around Goal Status [phone]): 0 percent impaired, limited or restricted    Time: 1521-1541 PT Time Calculation (min) (ACUTE ONLY): 20 min   Charges:   PT Evaluation $PT Eval Moderate Complexity: 1  Procedure     PT G Codes:   PT G-Codes **NOT FOR INPATIENT CLASS** Functional Assessment Tool Used: Clinical Judgement Functional Limitation: Mobility: Walking and moving around Mobility: Walking and Moving Around Current Status ([id number]): At least 20 percent but less than 40 percent impaired, limited or restricted Mobility: Walking and Moving Around Goal Status [phone]): 0 percent impaired, limited or restricted    Van Clines [facility] 12/30/2015, 6:06 PM  Van Clines, PT  Acute Rehabilitation Services Pager [phone] Office [phone]

## 2015-12-30 NOTE — Progress Notes (Signed)
CARDIOLOGY CONSULT NOTE     Primary Care Physician: Bethany SchaumannIbethal J Shamleffer, MD Referring Physician:  Admit Date: 12/29/2015  Reason for consultation:  Bethany Landry is a [age] y.o. female with a h/o CHF EF 25-30%, complete AV block s/p [employer] Jude pacemaker presents to the hospital after a fall.  She was sitting on the toilet when she became dizzy and short of breath with head spinning and fell.  She does not remember much of the episode.  Cannot tell if she had chest pain, SOB, or palpitations.  Does not know if she actually lost consciousness.  Interrogation of her pacemaker reveals no episodes of tachycardia or bradycardia.  Per the patient, her only complaint is of pain in her shoulder and ribs from fractures.  Says that her breathing is at baseline.  Today, she denies symptoms of palpitations, chest pain, shortness of breath, orthopnea, PND, lower extremity edema, dizziness, or neurologic sequela.   Past Medical History:  Diagnosis Date  . Benign hypertension   . CKD (chronic kidney disease), stage III    moderate  . Complete heart block (HCC)   . DOE (dyspnea on exertion)   . Fungal infection    recurrent  . Hyperlipidemia   . Hypothyroidism   . Insomnia   . Mastoiditis    hearing loss, s/p mastoiectomy, right ear  . Osteoarthritis   . Osteoporosis   . Sinoatrial node dysfunction (HCC)    Past Surgical History:  Procedure Laterality Date  . BACK SURGERY    . CATARACT EXTRACTION    . INSERT / REPLACE / REMOVE PACEMAKER  11/26/09   St. Jude, DDD PM  . LAPAROTOMY     Ex lap for abdominal pain (ischemic colitis)  . MASTOIDECTOMY Right    Tumor removal and residual recurrent fungal infection. Dr. Pollyann Kennedyosen  . THYROIDECTOMY      . acetaminophen  1,000 mg Oral TID  . carvedilol  3.125 mg Oral BID WC  . levothyroxine  125 mcg Oral QAC breakfast  . sodium chloride flush  3 mL Intravenous Q12H   . sodium chloride 1,000 mL (12/30/15 0802)    Allergies  Allergen Reactions  .  Norvasc [Amlodipine Besylate] Swelling    Swelling in ankles  . Quinapril Hcl Swelling  . Ace Inhibitors Swelling and Cough    Social History   Social History  . Marital status: Divorced    Spouse name: N/A  . Number of children: N/A  . Years of education: N/A   Occupational History  . Not on file.   Social History Main Topics  . Smoking status: Former Smoker    Types: Cigarettes    Quit date: 04/14/1982  . Smokeless tobacco: Never Used  . Alcohol use Yes     Comment: wine, occasionally  . Drug use: No  . Sexual activity: Not on file   Other Topics Concern  . Not on file   Social History Narrative  . No narrative on file    Family History  Problem Relation Age of Onset  . CAD Brother   . Cerebral aneurysm Brother   . Heart attack Mother     ROS- All systems are reviewed and negative except as per the HPI above  Physical Exam: Telemetry: Vitals:   12/29/15 2215 12/29/15 2307 12/29/15 2321 12/30/15 0506  BP: 159/77 (!) 150/82  (!) 129/56  Pulse: 102 99  81  Resp: (!) 28 (!) 27  (!) 22  Temp:  98 F (36.7 C)  98.2 F (36.8 C)  TempSrc:  Oral  Oral  SpO2: 99% 95% 95% 95%  Weight:  117 lb 1 oz (53.1 kg)    Height:  5\' 3"  (1.6 m)      GEN- The patient is alert and oriented x 3 today.   Head- normocephalic, scalp bruising and laceration Eyes-  Sclera clear, conjunctiva pink Ears- hearing intact Oropharynx- clear Neck- supple, no JVP Lymph- no cervical lymphadenopathy Lungs- faint crackles at the bases, normal work of breathing Heart- Regular rate and rhythm, no murmurs, rubs or gallops, PMI not laterally displaced GI- soft, NT, ND, + BS Extremities- no clubbing, cyanosis, or edema MS- no significant deformity or atrophy Skin- no rash or lesion Psych- euthymic mood, full affect Neuro- strength and sensation are intact  EKG: A sense, V pace  Labs:   Lab Results  Component Value Date   WBC 9.2 12/30/2015   HGB 9.9 (L) 12/30/2015   HCT 30.4 (L)  12/30/2015   MCV 88.1 12/30/2015   PLT 195 12/30/2015    Recent Labs Lab 12/29/15 1748 12/30/15 0531  NA 133* 136  K 4.0 3.3*  CL 96* 99*  CO2 28 27  BUN 24* 22*  CREATININE 1.25* 1.11*  CALCIUM 9.0 8.7*  PROT 6.9  --   BILITOT 0.4  --   ALKPHOS 57  --   ALT 15  --   AST 21  --   GLUCOSE 120* 125*   Lab Results  Component Value Date   TROPONINI 0.14 (HH) 12/30/2015   No results found for: CHOL No results found for: HDL No results found for: LDLCALC No results found for: TRIG No results found for: CHOLHDL No results found for: LDLDIRECT    Radiology: Stable left base opacification which may be due to effusions/atelectasis, although cannot exclude infection. Chronic interstitial prominence over the mid to lower lungs.  Acute nondisplaced right posterior third rib fracture. Suggestion of a displaced distal right clavicle fracture.  Echo: - Left ventricle: The cavity size was normal. Wall thickness was   normal. Systolic function was severely reduced. The estimated   ejection fraction was in the range of 25% to 30%. Diffuse   hypokinesis. Features are consistent with a pseudonormal left   ventricular filling pattern, with concomitant abnormal relaxation   and increased filling pressure (grade 2 diastolic dysfunction). - Aortic valve: Trileaflet; mildly thickened, mildly calcified   leaflets. There was moderate regurgitation. - Pericardium, extracardiac: There was a left pleural effusion.  Impressions:  - When compared to prior echocardiogram, EF is reduced (prior 40%).  ASSESSMENT AND PLAN:   Complete AV block: Currently A sense, V paced.  No issues with pacemaker at this time.  Interrogations showed no evidence of arrhythmia.  Chronic systolic heart failure: appears well compensated today.  SOB is at baseline.  Would try to monitor strict I/O for volume status.  Would also get daily weights.  Would attempt to keep I=O.  Would restart her lasix when taking in  more PO fluids.   Bethany Neyman Jorja Loa, MD 12/30/2015  8:32 AM

## 2015-12-30 NOTE — Progress Notes (Signed)
[facility] PROGRESS NOTE  Bethany Landry AVW:[id number] DOB: November 12, 1920 DOA: 12/29/2015   PCP: Clelia Schaumann, MD     Assessment/Plan: Principal Problem:   Multiple fractures of ribs, right side, initial encounter for closed fracture Active Problems:   Essential hypertension   Cardiac pacemaker   Syncope   CKD (chronic kidney disease), stage III   Chronic hyponatremia   Protein-calorie malnutrition, severe   Chronic systolic CHF (congestive heart failure) (HCC)   Hypothyroidism   Closed right clavicular fracture     Bethany Landry is a 82 y.o. female with a past medical history significant for CHF EF 20-25%, CKD III, hypothyroidism, and CHB with pacer who presents with fall and rib fractures, right clavicular fracture. Patient was found to be orthostatic on admission. Pacemaker interrogated and did not show any new events. Patient has been seen by general surgery and EP.Her LV function had decreased from 40% in January to 20% in July.    Assessment and plan   1. Rib fractures and clavicle fracture:  Lateral 3-4 rib fractures new.  Posterior non-displaced rib fractures as well.  Right clavicle fracture.  Old rib fractures also noted.  Not on anticoagulation  Seen by trauma surgery, continued pain control, pulmonary toilet, sling for clavicle fracture, orthopedic consultation requested, Dr. Lajoyce Corners will see the patient -Acetaminophen scheduled TID -Oxycodone 2.5-5 mg PRN or hydromorphone PRN for pain -Ondansetron for nausea -Supplemental O2 as needed    2. Syncope:  likely orthostatic upon admission, also noted to have low potassium and magnesium, cannot completely rule out arrhythmia Pacer interrogation was normal.  Orthostatic vitals showed drop from 160 --> 120 with standing. Replete electrolytes Patient has been seen by cardiology Abnormal troponin likely secondary to chronic heart failure  3. Elevated troponin:  Secondary to chronic heart failure?Marland Kitchen   No chest pain present.  ACS not immediately suspected. -Monitor on telemetry Cardiology following,  4. HTN:  -Continue carvedilol  5. CKD:  Stable  6. Chronic hyponatremia:  Stable  7. Chronic systolic CHF:  EF 20-25% in July.  Appears at baseline weight and no dyspnea or peripheral edema. Will resume Lasix tomorrow  8. Hypothyroidism: -Continue levothyroxine  9. Chronic protein-calorie malnutrition, severe: -Continue Ensure supplements  10. Hypokalemia-hypomagnesemia-repleted    DVT prophylaxsis  heparin  Code Status:  DO NOT RESUSCITATE    Family Communication: Discussed in detail with the patient, all imaging results, lab results explained to the patient   Disposition Plan:  PT evaluation, orthopedic consult   Consultations-trauma, EP, orthopedics  Procedures:  None  Antibiotics: Anti-infectives    None         HPI/Subjective: Lot of chest pain with deep breathing   Objective: Vitals:   12/29/15 2215 12/29/15 2307 12/29/15 2321 12/30/15 0506  BP: 159/77 (!) 150/82  (!) 129/56  Pulse: 102 99  81  Resp: (!) 28 (!) 27  (!) 22  Temp:  98 F (36.7 C)  98.2 F (36.8 C)  TempSrc:  Oral  Oral  SpO2: 99% 95% 95% 95%  Weight:  53.1 kg (117 lb 1 oz)    Height:  5\' 3"  (1.6 m)      Intake/Output Summary (Last 24 hours) at 12/30/15 1045 Last data filed at 12/30/15 0730  Gross per 24 hour  Intake             1080 ml  Output              375  ml  Net              705 ml    Exam:  Examination:  General exam: Appears calm and comfortable  Respiratory system: Clear to auscultation. Respiratory effort normal. Cardiovascular system: S1 & S2 heard, RRR. No JVD, murmurs, rubs, gallops or clicks. No pedal edema. Gastrointestinal system: Abdomen is nondistended, soft and nontender. No organomegaly or masses felt. Normal bowel sounds heard. Central nervous system: Alert and oriented. No focal neurological deficits. Extremities: Symmetric 5 x 5  power. Skin: No rashes, lesions or ulcers Psychiatry: Judgement and insight appear normal. Mood & affect appropriate.     Data Reviewed: I have personally reviewed following labs and imaging studies  Micro Results Recent Results (from the past 240 hour(s))  MRSA PCR Screening     Status: None   Collection Time: 12/30/15  1:45 AM  Result Value Ref Range Status   MRSA by PCR NEGATIVE NEGATIVE Final    Comment:        The GeneXpert MRSA Assay (FDA approved for NASAL specimens only), is one component of a comprehensive MRSA colonization surveillance program. It is not intended to diagnose MRSA infection nor to guide or monitor treatment for MRSA infections.     Radiology Reports Dg Chest 1 View  Result Date: 12/29/2015 CLINICAL DATA:  Right-sided rib and right shoulder pain after fall today. EXAM: CHEST 1 VIEW COMPARISON:  10/21/2015 FINDINGS: Left-sided pacemaker unchanged. Lungs are adequately inflated with mild stable interstitial prominence over the mid to lower lungs. Stable hazy opacification over the left base/ retrocardiac region. Mild stable cardiomegaly. Calcified plaque is present over the thoracic aorta. There are moderate degenerate changes of the spine. Stabilization hardware is partially visualized over the lower thoracic spine intact. Multiple old right rib fractures. Acute nondisplaced right posterior third rib fracture. Suggestion of an acute displaced distal right clavicle fracture. IMPRESSION: Stable left base opacification which may be due to effusions/atelectasis, although cannot exclude infection. Chronic interstitial prominence over the mid to lower lungs. Acute nondisplaced right posterior third rib fracture. Suggestion of a displaced distal right clavicle fracture. Electronically Signed   By: Elberta Fortis M.D.   On: 12/29/2015 19:10   Dg Ribs Unilateral Right  Result Date: 12/29/2015 CLINICAL DATA:  Right-sided rib pain and right shoulder pain after fall  today. EXAM: RIGHT RIBS - 2 VIEW COMPARISON:  Chest x-ray 10/21/2015 FINDINGS: Examination demonstrates suggestion of a distal right clavicle fracture. There is a non displaced fracture of the right posterior fourth rib and possibly the medial aspect of the right posterior third rib. There are multiple old right-sided rib fractures. There are displaced lateral right third and fourth rib fractures. Remainder the exam is unchanged. IMPRESSION: Displaced right lateral third and fourth rib fractures and nondisplaced right posterior fourth rib fracture. Possible fracture along the posterior medial aspect of the right third rib. Suggestion a distal right clavicle fracture. Electronically Signed   By: Elberta Fortis M.D.   On: 12/29/2015 19:14   Dg Shoulder Right  Result Date: 12/29/2015 CLINICAL DATA:  Right rib and shoulder pain after fall today. EXAM: RIGHT SHOULDER - 2+ VIEW COMPARISON:  None. FINDINGS: There is a displaced acute distal right clavicle fracture. Evidence of patient's displaced lateral right third and fourth rib fractures. Suggestion of a posterior medial left second and third rib fracture. Degenerative change of the [facility] joint and glenohumeral joints. IMPRESSION: Displaced distal right clavicle fracture. Displaced right lateral third and fourth rib fractures. Suggestion  of minimally displaced posterior medial right second and third rib fractures. Electronically Signed   By: Elberta Fortisaniel  Boyle M.D.   On: 12/29/2015 19:17   Ct Head Wo Contrast  Result Date: 12/29/2015 CLINICAL DATA:  Fall from toilet with laceration to right-sided head. Right rib and shoulder pain. EXAM: CT HEAD WITHOUT CONTRAST CT CERVICAL SPINE WITHOUT CONTRAST TECHNIQUE: Multidetector CT imaging of the head and cervical spine was performed following the standard protocol without intravenous contrast. Multiplanar CT image reconstructions of the cervical spine were also generated. COMPARISON:  05/04/2015 FINDINGS: CT HEAD FINDINGS Brain:  The ventricles and cisterns are within normal. There is minimal age related atrophic change present. There is chronic ischemic microvascular disease. There is no mass, mass effect, shift of midline structures or acute hemorrhage. No evidence of acute infarction. Vascular: Calcified plaque over the cavernous and petrous segments of the internal carotid arteries. Calcified plaque over the vertebral arteries. Skull: No acute fracture. Postsurgical change compatible previous left mastoidectomy with stable residual density over the surgical bed and middle ear cavity. Bone island over the clivus unchanged. Sinuses/Orbits: Paranasal sinuses are clear. Orbits are within normal. Other: Small right frontal temporal scalp contusion. CT CERVICAL SPINE FINDINGS Alignment: Straightening of the normal cervical lordosis unchanged. Skull base and vertebrae: Mild spondylosis throughout the cervical spine. No acute fracture or traumatic subluxation. Moderate uncovertebral joint spurring and facet arthropathy. Significant bilateral neural foraminal narrowing at multiple levels due to adjacent bony spurring. Soft tissues and spinal canal: Prevertebral soft tissues are within normal. Spinal canal is unremarkable. Disc levels: Disc space narrowing at the C5-6 level and to lesser extent at the C6-7 level unchanged. Upper chest: Minimal emphysematous disease. Suggestion of a nondisplaced fracture of the right posterior fourth rib. Other: IMPRESSION: No acute intracranial findings. Chronic ischemic microvascular disease and mild age related atrophic change. Small right frontotemporal scalp contusion. Stable postsurgical change of the right mastoid sinus. No acute cervical spine injury. Spondylosis throughout the cervical spine with disc disease and multilevel neural foraminal narrowing as described. Suggestion a nondisplaced fracture of the right posterior fourth rib. Electronically Signed   By: Elberta Fortisaniel  Boyle M.D.   On: 12/29/2015 19:46    Ct Cervical Spine Wo Contrast  Result Date: 12/29/2015 CLINICAL DATA:  Fall from toilet with laceration to right-sided head. Right rib and shoulder pain. EXAM: CT HEAD WITHOUT CONTRAST CT CERVICAL SPINE WITHOUT CONTRAST TECHNIQUE: Multidetector CT imaging of the head and cervical spine was performed following the standard protocol without intravenous contrast. Multiplanar CT image reconstructions of the cervical spine were also generated. COMPARISON:  05/04/2015 FINDINGS: CT HEAD FINDINGS Brain: The ventricles and cisterns are within normal. There is minimal age related atrophic change present. There is chronic ischemic microvascular disease. There is no mass, mass effect, shift of midline structures or acute hemorrhage. No evidence of acute infarction. Vascular: Calcified plaque over the cavernous and petrous segments of the internal carotid arteries. Calcified plaque over the vertebral arteries. Skull: No acute fracture. Postsurgical change compatible previous left mastoidectomy with stable residual density over the surgical bed and middle ear cavity. Bone island over the clivus unchanged. Sinuses/Orbits: Paranasal sinuses are clear. Orbits are within normal. Other: Small right frontal temporal scalp contusion. CT CERVICAL SPINE FINDINGS Alignment: Straightening of the normal cervical lordosis unchanged. Skull base and vertebrae: Mild spondylosis throughout the cervical spine. No acute fracture or traumatic subluxation. Moderate uncovertebral joint spurring and facet arthropathy. Significant bilateral neural foraminal narrowing at multiple levels due to adjacent bony spurring.  Soft tissues and spinal canal: Prevertebral soft tissues are within normal. Spinal canal is unremarkable. Disc levels: Disc space narrowing at the C5-6 level and to lesser extent at the C6-7 level unchanged. Upper chest: Minimal emphysematous disease. Suggestion of a nondisplaced fracture of the right posterior fourth rib. Other:  IMPRESSION: No acute intracranial findings. Chronic ischemic microvascular disease and mild age related atrophic change. Small right frontotemporal scalp contusion. Stable postsurgical change of the right mastoid sinus. No acute cervical spine injury. Spondylosis throughout the cervical spine with disc disease and multilevel neural foraminal narrowing as described. Suggestion a nondisplaced fracture of the right posterior fourth rib. Electronically Signed   By: Elberta Fortis M.D.   On: 12/29/2015 19:46     CBC  Recent Labs Lab 12/29/15 1748 12/30/15 0531  WBC 11.4* 9.2  HGB 11.5* 9.9*  HCT 34.8* 30.4*  PLT 250 195  MCV 89.7 88.1  MCH 29.6 28.7  MCHC 33.0 32.6  RDW 13.7 13.9  LYMPHSABS 1.2  --   MONOABS 0.6  --   EOSABS 0.3  --   BASOSABS 0.0  --     Chemistries   Recent Labs Lab 12/29/15 1748 12/30/15 0048 12/30/15 0531  NA 133*  --  136  K 4.0  --  3.3*  CL 96*  --  99*  CO2 28  --  27  GLUCOSE 120*  --  125*  BUN 24*  --  22*  CREATININE 1.25*  --  1.11*  CALCIUM 9.0  --  8.7*  MG  --  1.5*  --   AST 21  --   --   ALT 15  --   --   ALKPHOS 57  --   --   BILITOT 0.4  --   --    ------------------------------------------------------------------------------------------------------------------ estimated creatinine clearance is 26.2 mL/min (by C-G formula based on SCr of 1.11 mg/dL (H)). ------------------------------------------------------------------------------------------------------------------ No results for input(s): HGBA1C in the last 72 hours. ------------------------------------------------------------------------------------------------------------------ No results for input(s): CHOL, HDL, LDLCALC, TRIG, CHOLHDL, LDLDIRECT in the last 72 hours. ------------------------------------------------------------------------------------------------------------------  Recent Labs  12/30/15 0048  TSH 0.053*    ------------------------------------------------------------------------------------------------------------------ No results for input(s): VITAMINB12, FOLATE, FERRITIN, TIBC, IRON, RETICCTPCT in the last 72 hours.  Coagulation profile No results for input(s): INR, PROTIME in the last 168 hours.  No results for input(s): DDIMER in the last 72 hours.  Cardiac Enzymes  Recent Labs Lab 12/30/15 0048 12/30/15 0531  TROPONINI 0.18* 0.14*   ------------------------------------------------------------------------------------------------------------------ Invalid input(s): POCBNP   CBG:  Recent Labs Lab 12/30/15 0729 12/30/15 0818  GLUCAP 105* 137*       Studies: Dg Chest 1 View  Result Date: 12/29/2015 CLINICAL DATA:  Right-sided rib and right shoulder pain after fall today. EXAM: CHEST 1 VIEW COMPARISON:  10/21/2015 FINDINGS: Left-sided pacemaker unchanged. Lungs are adequately inflated with mild stable interstitial prominence over the mid to lower lungs. Stable hazy opacification over the left base/ retrocardiac region. Mild stable cardiomegaly. Calcified plaque is present over the thoracic aorta. There are moderate degenerate changes of the spine. Stabilization hardware is partially visualized over the lower thoracic spine intact. Multiple old right rib fractures. Acute nondisplaced right posterior third rib fracture. Suggestion of an acute displaced distal right clavicle fracture. IMPRESSION: Stable left base opacification which may be due to effusions/atelectasis, although cannot exclude infection. Chronic interstitial prominence over the mid to lower lungs. Acute nondisplaced right posterior third rib fracture. Suggestion of a displaced distal right clavicle fracture. Electronically Signed  By: Elberta Fortis M.D.   On: 12/29/2015 19:10   Dg Ribs Unilateral Right  Result Date: 12/29/2015 CLINICAL DATA:  Right-sided rib pain and right shoulder pain after fall today. EXAM:  RIGHT RIBS - 2 VIEW COMPARISON:  Chest x-ray 10/21/2015 FINDINGS: Examination demonstrates suggestion of a distal right clavicle fracture. There is a non displaced fracture of the right posterior fourth rib and possibly the medial aspect of the right posterior third rib. There are multiple old right-sided rib fractures. There are displaced lateral right third and fourth rib fractures. Remainder the exam is unchanged. IMPRESSION: Displaced right lateral third and fourth rib fractures and nondisplaced right posterior fourth rib fracture. Possible fracture along the posterior medial aspect of the right third rib. Suggestion a distal right clavicle fracture. Electronically Signed   By: Elberta Fortis M.D.   On: 12/29/2015 19:14   Dg Shoulder Right  Result Date: 12/29/2015 CLINICAL DATA:  Right rib and shoulder pain after fall today. EXAM: RIGHT SHOULDER - 2+ VIEW COMPARISON:  None. FINDINGS: There is a displaced acute distal right clavicle fracture. Evidence of patient's displaced lateral right third and fourth rib fractures. Suggestion of a posterior medial left second and third rib fracture. Degenerative change of the [facility] joint and glenohumeral joints. IMPRESSION: Displaced distal right clavicle fracture. Displaced right lateral third and fourth rib fractures. Suggestion of minimally displaced posterior medial right second and third rib fractures. Electronically Signed   By: Elberta Fortis M.D.   On: 12/29/2015 19:17   Ct Head Wo Contrast  Result Date: 12/29/2015 CLINICAL DATA:  Fall from toilet with laceration to right-sided head. Right rib and shoulder pain. EXAM: CT HEAD WITHOUT CONTRAST CT CERVICAL SPINE WITHOUT CONTRAST TECHNIQUE: Multidetector CT imaging of the head and cervical spine was performed following the standard protocol without intravenous contrast. Multiplanar CT image reconstructions of the cervical spine were also generated. COMPARISON:  05/04/2015 FINDINGS: CT HEAD FINDINGS Brain: The  ventricles and cisterns are within normal. There is minimal age related atrophic change present. There is chronic ischemic microvascular disease. There is no mass, mass effect, shift of midline structures or acute hemorrhage. No evidence of acute infarction. Vascular: Calcified plaque over the cavernous and petrous segments of the internal carotid arteries. Calcified plaque over the vertebral arteries. Skull: No acute fracture. Postsurgical change compatible previous left mastoidectomy with stable residual density over the surgical bed and middle ear cavity. Bone island over the clivus unchanged. Sinuses/Orbits: Paranasal sinuses are clear. Orbits are within normal. Other: Small right frontal temporal scalp contusion. CT CERVICAL SPINE FINDINGS Alignment: Straightening of the normal cervical lordosis unchanged. Skull base and vertebrae: Mild spondylosis throughout the cervical spine. No acute fracture or traumatic subluxation. Moderate uncovertebral joint spurring and facet arthropathy. Significant bilateral neural foraminal narrowing at multiple levels due to adjacent bony spurring. Soft tissues and spinal canal: Prevertebral soft tissues are within normal. Spinal canal is unremarkable. Disc levels: Disc space narrowing at the C5-6 level and to lesser extent at the C6-7 level unchanged. Upper chest: Minimal emphysematous disease. Suggestion of a nondisplaced fracture of the right posterior fourth rib. Other: IMPRESSION: No acute intracranial findings. Chronic ischemic microvascular disease and mild age related atrophic change. Small right frontotemporal scalp contusion. Stable postsurgical change of the right mastoid sinus. No acute cervical spine injury. Spondylosis throughout the cervical spine with disc disease and multilevel neural foraminal narrowing as described. Suggestion a nondisplaced fracture of the right posterior fourth rib. Electronically Signed   By: Elberta Fortis  M.D.   On: 12/29/2015 19:46   Ct  Cervical Spine Wo Contrast  Result Date: 12/29/2015 CLINICAL DATA:  Fall from toilet with laceration to right-sided head. Right rib and shoulder pain. EXAM: CT HEAD WITHOUT CONTRAST CT CERVICAL SPINE WITHOUT CONTRAST TECHNIQUE: Multidetector CT imaging of the head and cervical spine was performed following the standard protocol without intravenous contrast. Multiplanar CT image reconstructions of the cervical spine were also generated. COMPARISON:  05/04/2015 FINDINGS: CT HEAD FINDINGS Brain: The ventricles and cisterns are within normal. There is minimal age related atrophic change present. There is chronic ischemic microvascular disease. There is no mass, mass effect, shift of midline structures or acute hemorrhage. No evidence of acute infarction. Vascular: Calcified plaque over the cavernous and petrous segments of the internal carotid arteries. Calcified plaque over the vertebral arteries. Skull: No acute fracture. Postsurgical change compatible previous left mastoidectomy with stable residual density over the surgical bed and middle ear cavity. Bone island over the clivus unchanged. Sinuses/Orbits: Paranasal sinuses are clear. Orbits are within normal. Other: Small right frontal temporal scalp contusion. CT CERVICAL SPINE FINDINGS Alignment: Straightening of the normal cervical lordosis unchanged. Skull base and vertebrae: Mild spondylosis throughout the cervical spine. No acute fracture or traumatic subluxation. Moderate uncovertebral joint spurring and facet arthropathy. Significant bilateral neural foraminal narrowing at multiple levels due to adjacent bony spurring. Soft tissues and spinal canal: Prevertebral soft tissues are within normal. Spinal canal is unremarkable. Disc levels: Disc space narrowing at the C5-6 level and to lesser extent at the C6-7 level unchanged. Upper chest: Minimal emphysematous disease. Suggestion of a nondisplaced fracture of the right posterior fourth rib. Other: IMPRESSION:  No acute intracranial findings. Chronic ischemic microvascular disease and mild age related atrophic change. Small right frontotemporal scalp contusion. Stable postsurgical change of the right mastoid sinus. No acute cervical spine injury. Spondylosis throughout the cervical spine with disc disease and multilevel neural foraminal narrowing as described. Suggestion a nondisplaced fracture of the right posterior fourth rib. Electronically Signed   By: Elberta Fortis M.D.   On: 12/29/2015 19:46      No results found for: HGBA1C Lab Results  Component Value Date   CREATININE 1.11 (H) 12/30/2015       Scheduled Meds: . acetaminophen  1,000 mg Oral TID  . carvedilol  3.125 mg Oral BID WC  . levothyroxine  125 mcg Oral QAC breakfast  . magnesium sulfate 1 - 4 g bolus IVPB  2 g Intravenous Once  . potassium chloride  40 mEq Oral Once  . sodium chloride flush  3 mL Intravenous Q12H   Continuous Infusions:    LOS: 0 days    Time spent: >30 MINS    [facility]  [facility] Pager [phone]. If 7PM-7AM, please contact night-coverage at [email or web address], password [facility] 12/30/2015, 10:45 AM  LOS: 0 days

## 2015-12-30 NOTE — Consult Note (Addendum)
Reason for Consult:Fall/ rib fractures Referring Physician: Mckinzie Saksa is an 82 y.o. female.  HPI: 82 yo female with an extensive past medical history presents after syncopal episode.  She was sitting on the commode at her assisted living facility when she felt dizzy and fell to her right side.  Apparently, she landed on her right side, striking the vanity.  She was transported to the ED for evaluation, complaining of right sided chest pain, worse with movement.  She has a small scalp laceration.  She is being admitted by [facility].  We are asked to consult  Past Medical History:  Diagnosis Date  . Benign hypertension   . CKD (chronic kidney disease), stage III    moderate  . Complete heart block (Home)   . DOE (dyspnea on exertion)   . Fungal infection    recurrent  . Hyperlipidemia   . Hypothyroidism   . Insomnia   . Mastoiditis    hearing loss, s/p mastoiectomy, right ear  . Osteoarthritis   . Osteoporosis   . Sinoatrial node dysfunction (HCC)     Past Surgical History:  Procedure Laterality Date  . BACK SURGERY    . CATARACT EXTRACTION    . INSERT / REPLACE / REMOVE PACEMAKER  11/26/09   [employer], DDD PM  . LAPAROTOMY     Ex lap for abdominal pain (ischemic colitis)  . MASTOIDECTOMY Right    Tumor removal and residual recurrent fungal infection. Dr. Constance Holster  . THYROIDECTOMY      Family History  Problem Relation Age of Onset  . CAD Brother   . Cerebral aneurysm Brother   . Heart attack Mother     Social History:  reports that she quit smoking about 33 years ago. Her smoking use included Cigarettes. She has never used smokeless tobacco. She reports that she drinks alcohol. She reports that she does not use drugs.   Her son is an Magazine features editor at [employer].  Allergies:  Allergies  Allergen Reactions  . Norvasc [Amlodipine Besylate] Swelling    Swelling in ankles  . Quinapril Hcl Swelling  . Ace Inhibitors Swelling and Cough     Medications:  Prior to Admission medications   Medication Sig Start Date End Date Taking? Authorizing Provider  acetaminophen (TYLENOL) 500 MG tablet Take 500 mg by mouth 2 (two) times daily as needed for mild pain or moderate pain.   Yes Historical Provider, MD  aspirin 81 MG chewable tablet Chew 1 tablet (81 mg total) by mouth daily. 10/26/15  Yes Nishant Dhungel, MD  carvedilol (COREG) 3.125 MG tablet Take 1 tablet (3.125 mg total) by mouth 2 (two) times daily with a meal. 10/26/15  Yes Nishant Dhungel, MD  ENSURE PLUS (ENSURE PLUS) LIQD Take 237 mLs by mouth 2 (two) times daily as needed (for lack of appetite).   Yes Historical Provider, MD  furosemide (LASIX) 40 MG tablet Take 40 mg by mouth 2 (two) times daily.    Yes Historical Provider, MD  levothyroxine (SYNTHROID, LEVOTHROID) 125 MCG tablet Take 1 TABLET (125 mcg) BY MOUTH ONCE DAILY 11/17/14  Yes Historical Provider, MD  NON FORMULARY Take 240 mLs by mouth as needed (for constipation). Prune Juice    Yes Historical Provider, MD     Results for orders placed or performed during the hospital encounter of 12/29/15 (from the past 48 hour(s))  CBC with Differential     Status: Abnormal   Collection Time: 12/29/15  5:48 PM  Result Value Ref Range   WBC 11.4 (H) 4.0 - 10.5 K/uL   RBC 3.88 3.87 - 5.11 MIL/uL   Hemoglobin 11.5 (L) 12.0 - 15.0 g/dL   HCT 34.8 (L) 36.0 - 46.0 %   MCV 89.7 78.0 - 100.0 fL   MCH 29.6 26.0 - 34.0 pg   MCHC 33.0 30.0 - 36.0 g/dL   RDW 13.7 11.5 - 15.5 %   Platelets 250 150 - 400 K/uL   Neutrophils Relative % 82 %   Neutro Abs 9.2 (H) 1.7 - 7.7 K/uL   Lymphocytes Relative 11 %   Lymphs Abs 1.2 0.7 - 4.0 K/uL   Monocytes Relative 5 %   Monocytes Absolute 0.6 0.1 - 1.0 K/uL   Eosinophils Relative 2 %   Eosinophils Absolute 0.3 0.0 - 0.7 K/uL   Basophils Relative 0 %   Basophils Absolute 0.0 0.0 - 0.1 K/uL  Comprehensive metabolic panel     Status: Abnormal   Collection Time: 12/29/15  5:48 PM  Result  Value Ref Range   Sodium 133 (L) 135 - 145 mmol/L   Potassium 4.0 3.5 - 5.1 mmol/L   Chloride 96 (L) 101 - 111 mmol/L   CO2 28 22 - 32 mmol/L   Glucose, Bld 120 (H) 65 - 99 mg/dL   BUN 24 (H) 6 - 20 mg/dL   Creatinine, Ser 1.25 (H) 0.44 - 1.00 mg/dL   Calcium 9.0 8.9 - 10.3 mg/dL   Total Protein 6.9 6.5 - 8.1 g/dL   Albumin 3.4 (L) 3.5 - 5.0 g/dL   AST 21 15 - 41 U/L   ALT 15 14 - 54 U/L   Alkaline Phosphatase 57 38 - 126 U/L   Total Bilirubin 0.4 0.3 - 1.2 mg/dL   GFR calc non Af Amer 36 (L) >60 mL/min   GFR calc Af Amer 42 (L) >60 mL/min    Comment: (NOTE) The eGFR has been calculated using the CKD EPI equation. This calculation has not been validated in all clinical situations. eGFR's persistently <60 mL/min signify possible Chronic Kidney Disease.    Anion gap 9 5 - 15  Brain natriuretic peptide     Status: Abnormal   Collection Time: 12/29/15  5:48 PM  Result Value Ref Range   B Natriuretic Peptide 679.9 (H) 0.0 - 100.0 pg/mL  I-stat troponin, ED     Status: Abnormal   Collection Time: 12/29/15  6:02 PM  Result Value Ref Range   Troponin i, poc 0.16 (HH) 0.00 - 0.08 ng/mL   Comment NOTIFIED PHYSICIAN    Comment 3            Comment: Due to the release kinetics of cTnI, a negative result within the first hours of the onset of symptoms does not rule out myocardial infarction with certainty. If myocardial infarction is still suspected, repeat the test at appropriate intervals.   Urinalysis, Routine w reflex microscopic     Status: Abnormal   Collection Time: 12/29/15  8:41 PM  Result Value Ref Range   Color, Urine YELLOW YELLOW   APPearance CLOUDY (A) CLEAR   Specific Gravity, Urine 1.009 1.005 - 1.030   pH 7.0 5.0 - 8.0   Glucose, UA NEGATIVE NEGATIVE mg/dL   Hgb urine dipstick NEGATIVE NEGATIVE   Bilirubin Urine NEGATIVE NEGATIVE   Ketones, ur NEGATIVE NEGATIVE mg/dL   Protein, ur NEGATIVE NEGATIVE mg/dL   Nitrite NEGATIVE NEGATIVE   Leukocytes, UA MODERATE  (A) NEGATIVE  Urine microscopic-add  on     Status: Abnormal   Collection Time: 12/29/15  8:41 PM  Result Value Ref Range   Squamous Epithelial / LPF 0-5 (A) NONE SEEN   WBC, UA 6-30 0 - 5 WBC/hpf   RBC / HPF NONE SEEN 0 - 5 RBC/hpf   Bacteria, UA FEW (A) NONE SEEN    Dg Chest 1 View  Result Date: 12/29/2015 CLINICAL DATA:  Right-sided rib and right shoulder pain after fall today. EXAM: CHEST 1 VIEW COMPARISON:  10/21/2015 FINDINGS: Left-sided pacemaker unchanged. Lungs are adequately inflated with mild stable interstitial prominence over the mid to lower lungs. Stable hazy opacification over the left base/ retrocardiac region. Mild stable cardiomegaly. Calcified plaque is present over the thoracic aorta. There are moderate degenerate changes of the spine. Stabilization hardware is partially visualized over the lower thoracic spine intact. Multiple old right rib fractures. Acute nondisplaced right posterior third rib fracture. Suggestion of an acute displaced distal right clavicle fracture. IMPRESSION: Stable left base opacification which may be due to effusions/atelectasis, although cannot exclude infection. Chronic interstitial prominence over the mid to lower lungs. Acute nondisplaced right posterior third rib fracture. Suggestion of a displaced distal right clavicle fracture. Electronically Signed   By: Marin Olp M.D.   On: 12/29/2015 19:10   Dg Ribs Unilateral Right  Result Date: 12/29/2015 CLINICAL DATA:  Right-sided rib pain and right shoulder pain after fall today. EXAM: RIGHT RIBS - 2 VIEW COMPARISON:  Chest x-ray 10/21/2015 FINDINGS: Examination demonstrates suggestion of a distal right clavicle fracture. There is a non displaced fracture of the right posterior fourth rib and possibly the medial aspect of the right posterior third rib. There are multiple old right-sided rib fractures. There are displaced lateral right third and fourth rib fractures. Remainder the exam is unchanged.  IMPRESSION: Displaced right lateral third and fourth rib fractures and nondisplaced right posterior fourth rib fracture. Possible fracture along the posterior medial aspect of the right third rib. Suggestion a distal right clavicle fracture. Electronically Signed   By: Marin Olp M.D.   On: 12/29/2015 19:14   Dg Shoulder Right  Result Date: 12/29/2015 CLINICAL DATA:  Right rib and shoulder pain after fall today. EXAM: RIGHT SHOULDER - 2+ VIEW COMPARISON:  None. FINDINGS: There is a displaced acute distal right clavicle fracture. Evidence of patient's displaced lateral right third and fourth rib fractures. Suggestion of a posterior medial left second and third rib fracture. Degenerative change of the [facility] joint and glenohumeral joints. IMPRESSION: Displaced distal right clavicle fracture. Displaced right lateral third and fourth rib fractures. Suggestion of minimally displaced posterior medial right second and third rib fractures. Electronically Signed   By: Marin Olp M.D.   On: 12/29/2015 19:17   Ct Head Wo Contrast  Result Date: 12/29/2015 CLINICAL DATA:  Fall from toilet with laceration to right-sided head. Right rib and shoulder pain. EXAM: CT HEAD WITHOUT CONTRAST CT CERVICAL SPINE WITHOUT CONTRAST TECHNIQUE: Multidetector CT imaging of the head and cervical spine was performed following the standard protocol without intravenous contrast. Multiplanar CT image reconstructions of the cervical spine were also generated. COMPARISON:  05/04/2015 FINDINGS: CT HEAD FINDINGS Brain: The ventricles and cisterns are within normal. There is minimal age related atrophic change present. There is chronic ischemic microvascular disease. There is no mass, mass effect, shift of midline structures or acute hemorrhage. No evidence of acute infarction. Vascular: Calcified plaque over the cavernous and petrous segments of the internal carotid arteries. Calcified plaque over the vertebral  arteries. Skull: No acute  fracture. Postsurgical change compatible previous left mastoidectomy with stable residual density over the surgical bed and middle ear cavity. Bone island over the clivus unchanged. Sinuses/Orbits: Paranasal sinuses are clear. Orbits are within normal. Other: Small right frontal temporal scalp contusion. CT CERVICAL SPINE FINDINGS Alignment: Straightening of the normal cervical lordosis unchanged. Skull base and vertebrae: Mild spondylosis throughout the cervical spine. No acute fracture or traumatic subluxation. Moderate uncovertebral joint spurring and facet arthropathy. Significant bilateral neural foraminal narrowing at multiple levels due to adjacent bony spurring. Soft tissues and spinal canal: Prevertebral soft tissues are within normal. Spinal canal is unremarkable. Disc levels: Disc space narrowing at the C5-6 level and to lesser extent at the C6-7 level unchanged. Upper chest: Minimal emphysematous disease. Suggestion of a nondisplaced fracture of the right posterior fourth rib. Other: IMPRESSION: No acute intracranial findings. Chronic ischemic microvascular disease and mild age related atrophic change. Small right frontotemporal scalp contusion. Stable postsurgical change of the right mastoid sinus. No acute cervical spine injury. Spondylosis throughout the cervical spine with disc disease and multilevel neural foraminal narrowing as described. Suggestion a nondisplaced fracture of the right posterior fourth rib. Electronically Signed   By: Marin Olp M.D.   On: 12/29/2015 19:46   Ct Cervical Spine Wo Contrast  Result Date: 12/29/2015 CLINICAL DATA:  Fall from toilet with laceration to right-sided head. Right rib and shoulder pain. EXAM: CT HEAD WITHOUT CONTRAST CT CERVICAL SPINE WITHOUT CONTRAST TECHNIQUE: Multidetector CT imaging of the head and cervical spine was performed following the standard protocol without intravenous contrast. Multiplanar CT image reconstructions of the cervical spine  were also generated. COMPARISON:  05/04/2015 FINDINGS: CT HEAD FINDINGS Brain: The ventricles and cisterns are within normal. There is minimal age related atrophic change present. There is chronic ischemic microvascular disease. There is no mass, mass effect, shift of midline structures or acute hemorrhage. No evidence of acute infarction. Vascular: Calcified plaque over the cavernous and petrous segments of the internal carotid arteries. Calcified plaque over the vertebral arteries. Skull: No acute fracture. Postsurgical change compatible previous left mastoidectomy with stable residual density over the surgical bed and middle ear cavity. Bone island over the clivus unchanged. Sinuses/Orbits: Paranasal sinuses are clear. Orbits are within normal. Other: Small right frontal temporal scalp contusion. CT CERVICAL SPINE FINDINGS Alignment: Straightening of the normal cervical lordosis unchanged. Skull base and vertebrae: Mild spondylosis throughout the cervical spine. No acute fracture or traumatic subluxation. Moderate uncovertebral joint spurring and facet arthropathy. Significant bilateral neural foraminal narrowing at multiple levels due to adjacent bony spurring. Soft tissues and spinal canal: Prevertebral soft tissues are within normal. Spinal canal is unremarkable. Disc levels: Disc space narrowing at the C5-6 level and to lesser extent at the C6-7 level unchanged. Upper chest: Minimal emphysematous disease. Suggestion of a nondisplaced fracture of the right posterior fourth rib. Other: IMPRESSION: No acute intracranial findings. Chronic ischemic microvascular disease and mild age related atrophic change. Small right frontotemporal scalp contusion. Stable postsurgical change of the right mastoid sinus. No acute cervical spine injury. Spondylosis throughout the cervical spine with disc disease and multilevel neural foraminal narrowing as described. Suggestion a nondisplaced fracture of the right posterior fourth  rib. Electronically Signed   By: Marin Olp M.D.   On: 12/29/2015 19:46    ROS Blood pressure (!) 150/82, pulse 99, temperature 98 F (36.7 C), temperature source Oral, resp. rate (!) 27, height '5\' 3"'$  (1.6 m), weight 53.1 kg (117 lb 1 oz),  SpO2 95 %. Physical Exam  Vitals reviewed. Constitutional: She is oriented to person, place, and time. She appears well-developed and well-nourished. She is cooperative. No distress.  HENT:  Head: Normocephalic. Head is without raccoon's eyes, without Battle's sign, without abrasion, without contusion and without laceration.  Right Ear: Hearing, tympanic membrane, external ear and ear canal normal. No lacerations. No drainage or tenderness. No foreign bodies. Tympanic membrane is not perforated. No hemotympanum.  Left Ear: Hearing, tympanic membrane, external ear and ear canal normal. No lacerations. No drainage or tenderness. No foreign bodies. Tympanic membrane is not perforated. No hemotympanum.  Nose: Nose normal. No nose lacerations, sinus tenderness, nasal deformity or nasal septal hematoma. No epistaxis.  Mouth/Throat: Uvula is midline, oropharynx is clear and moist and mucous membranes are normal. No lacerations.  Small laceration to right scalp   Eyes: Conjunctivae, EOM and lids are normal. Pupils are equal, round, and reactive to light. No scleral icterus.  Neck: Trachea normal. No JVD present. No spinous process tenderness and no muscular tenderness present. Carotid bruit is not present. No thyromegaly present.  Cardiovascular: Normal rate, regular rhythm, normal heart sounds, intact distal pulses and normal pulses.   Respiratory: Effort normal and breath sounds normal. No respiratory distress. She exhibits no tenderness, no bony tenderness, no laceration and no crepitus.  Ecchymosis/ swelling over right clavicle into supraclavicular area  Tender right lateral chest  GI: Soft. Normal appearance. She exhibits no distension. Bowel sounds are  decreased. There is no tenderness. There is no rigidity, no rebound, no guarding and no CVA tenderness.  Musculoskeletal: Normal range of motion. She exhibits no edema or tenderness.  Lymphadenopathy:    She has no cervical adenopathy.  Neurological: She is alert and oriented to person, place, and time. She has normal strength. No cranial nerve deficit or sensory deficit. GCS eye subscore is 4. GCS verbal subscore is 5. GCS motor subscore is 6.  Skin: Skin is warm, dry and intact. She is not diaphoretic.  Psychiatric: She has a normal mood and affect. Her speech is normal and behavior is normal.    Assessment/Plan: 1.  Fall - syncopal episode 2.  Lateral right third and fourth rib fractures 3.  Right distal clavicle fracture  Recs:   Adequate pain control Pulmonary toilet - incentive spirometer Ice pack/ sling for clavicle fracture Will consult Orthopedics  Trauma will follow.   Kenli Waldo K. 12/30/2015, 12:08 AM

## 2015-12-31 ENCOUNTER — Observation Stay (HOSPITAL_BASED_OUTPATIENT_CLINIC_OR_DEPARTMENT_OTHER): Payer: Medicare Other

## 2015-12-31 DIAGNOSIS — N183 Chronic kidney disease, stage 3 (moderate): Secondary | ICD-10-CM | POA: Diagnosis not present

## 2015-12-31 DIAGNOSIS — S2241XA Multiple fractures of ribs, right side, initial encounter for closed fracture: Secondary | ICD-10-CM | POA: Diagnosis not present

## 2015-12-31 DIAGNOSIS — I349 Nonrheumatic mitral valve disorder, unspecified: Secondary | ICD-10-CM

## 2015-12-31 DIAGNOSIS — Z950 Presence of cardiac pacemaker: Secondary | ICD-10-CM | POA: Diagnosis not present

## 2015-12-31 DIAGNOSIS — S42031A Displaced fracture of lateral end of right clavicle, initial encounter for closed fracture: Secondary | ICD-10-CM | POA: Diagnosis not present

## 2015-12-31 LAB — CBC
HEMATOCRIT: 28.6 % — AB (ref 36.0–46.0)
Hemoglobin: 9.1 g/dL — ABNORMAL LOW (ref 12.0–15.0)
MCH: 28.4 pg (ref 26.0–34.0)
MCHC: 31.8 g/dL (ref 30.0–36.0)
MCV: 89.4 fL (ref 78.0–100.0)
Platelets: 191 10*3/uL (ref 150–400)
RBC: 3.2 MIL/uL — ABNORMAL LOW (ref 3.87–5.11)
RDW: 13.7 % (ref 11.5–15.5)
WBC: 7.2 10*3/uL (ref 4.0–10.5)

## 2015-12-31 LAB — COMPREHENSIVE METABOLIC PANEL
ALBUMIN: 2.6 g/dL — AB (ref 3.5–5.0)
ALT: 12 U/L — ABNORMAL LOW (ref 14–54)
AST: 21 U/L (ref 15–41)
Alkaline Phosphatase: 45 U/L (ref 38–126)
Anion gap: 4 — ABNORMAL LOW (ref 5–15)
BILIRUBIN TOTAL: 0.4 mg/dL (ref 0.3–1.2)
BUN: 20 mg/dL (ref 6–20)
CHLORIDE: 102 mmol/L (ref 101–111)
CO2: 27 mmol/L (ref 22–32)
Calcium: 8.4 mg/dL — ABNORMAL LOW (ref 8.9–10.3)
Creatinine, Ser: 0.98 mg/dL (ref 0.44–1.00)
GFR calc Af Amer: 56 mL/min — ABNORMAL LOW (ref >60)
GFR calc non Af Amer: 48 mL/min — ABNORMAL LOW (ref >60)
GLUCOSE: 84 mg/dL (ref 65–99)
POTASSIUM: 4 mmol/L (ref 3.5–5.1)
SODIUM: 133 mmol/L — AB (ref 135–145)
TOTAL PROTEIN: 5.7 g/dL — AB (ref 6.5–8.1)

## 2015-12-31 LAB — ECHOCARDIOGRAM COMPLETE
Height: 63 in
Weight: 1887.14 oz

## 2015-12-31 LAB — GLUCOSE, CAPILLARY: Glucose-Capillary: 104 mg/dL — ABNORMAL HIGH (ref 65–99)

## 2015-12-31 LAB — MAGNESIUM: Magnesium: 2.3 mg/dL (ref 1.7–2.4)

## 2015-12-31 MED ORDER — SODIUM CHLORIDE 0.9 % IV BOLUS (SEPSIS)
500.0000 mL | Freq: Once | INTRAVENOUS | Status: AC
  Administered 2015-12-31: 500 mL via INTRAVENOUS

## 2015-12-31 MED ORDER — FUROSEMIDE 40 MG PO TABS: 40.0000 mg | ORAL_TABLET | Freq: Every day | ORAL | 0 refills | Status: DC

## 2015-12-31 MED ORDER — TRAMADOL HCL 50 MG PO TABS: 100.0000 mg | ORAL_TABLET | Freq: Two times a day (BID) | ORAL | 1 refills | Status: DC | PRN

## 2015-12-31 MED ORDER — OXYCODONE HCL 5 MG PO TABS: 2.5000 mg | ORAL_TABLET | Freq: Four times a day (QID) | ORAL | 0 refills | Status: DC | PRN

## 2015-12-31 MED ORDER — SENNOSIDES-DOCUSATE SODIUM 8.6-50 MG PO TABS: 1.0000 | ORAL_TABLET | Freq: Every evening | ORAL | 1 refills | Status: DC | PRN

## 2015-12-31 NOTE — Discharge Summary (Addendum)
Physician Discharge Summary  Bethany Landry MRN: [id number] DOB/AGE: [birth year] 82 y.o.  PCP: Ileana Roup, MD   Admit date: 12/29/2015 Discharge date: 12/31/2015  Discharge Diagnoses:    Principal Problem:   Multiple fractures of ribs, right side, initial encounter for closed fracture Active Problems:   Essential hypertension   Cardiac pacemaker   Syncope   CKD (chronic kidney disease), stage III   Chronic hyponatremia   Protein-calorie malnutrition, severe   Chronic systolic CHF (congestive heart failure) (HCC)   Hypothyroidism   Closed right clavicular fracture    Follow-up recommendations Follow-up with PCP in 3-5 days , including all  additional recommended appointments as below Follow-up CBC, magnesium, CMP in 3-5 days   Patient to follow-up with Dr. Sharol Given in 2 weeks for right clavicular fracture Patient to continue with incentive spirometry for acute right rib fractures Patient to continue with compression stockings for orthostatic hypotension, while ambulating Patient would need follow-up with primary cardiologist for review of her CHF medications, [dose of Lasix reduced this admission] Patient is being discharged with home health      Current Discharge Medication List    START taking these medications   Details  oxyCODONE (OXY IR/ROXICODONE) 5 MG immediate release tablet Take 0.5 tablets (2.5 mg total) by mouth every 6 (six) hours as needed for severe  pain. Qty: 30 tablet, Refills: 0    senna-docusate (SENOKOT-S) 8.6-50 MG tablet Take 1 tablet by mouth at bedtime as needed for mild constipation. Qty: 60 tablet, Refills: 1    traMADol (ULTRAM) 50 MG tablet Take 2 tablets (100 mg total) by mouth every 12 (twelve) hours as needed for moderate pain. Qty: 30 tablet, Refills: 1      CONTINUE these medications which have CHANGED   Details  furosemide (LASIX) 40 MG tablet Take 1 tablet (40 mg total) by mouth daily. Qty: 30 tablet, Refills: 0       CONTINUE these medications which have NOT CHANGED   Details  acetaminophen (TYLENOL) 500 MG tablet Take 500 mg by mouth 2 (two) times daily as needed for mild pain or moderate pain.    aspirin 81 MG chewable tablet Chew 1 tablet (81 mg total) by mouth daily. Qty: 30 tablet, Refills: 0    carvedilol (COREG) 3.125 MG tablet Take 1 tablet (3.125 mg total) by mouth 2 (two) times daily with a meal. Qty: 60 tablet, Refills: 0    ENSURE PLUS (ENSURE PLUS) LIQD Take 237 mLs by mouth 2 (two) times daily as needed (for lack of appetite).    levothyroxine (SYNTHROID, LEVOTHROID) 125 MCG tablet Take 1 TABLET (125 mcg) BY MOUTH ONCE DAILY Refills: 6    NON FORMULARY Take 240 mLs by mouth as needed (for constipation). Prune Juice          Discharge Condition: Overall prognosis guarded   Discharge Instructions Get Medicines reviewed and adjusted: Please take all your medications with you for your next visit with your Primary MD  Please request your Primary MD to go over all hospital tests and procedure/radiological results at the follow up, please ask your Primary MD to get all Hospital records sent to his/her office.  If you experience worsening of your admission symptoms, develop shortness of breath, life threatening emergency, suicidal or homicidal thoughts you must seek medical attention immediately by calling 911 or calling your MD immediately if symptoms less severe.  You must read complete instructions/literature along with all the possible adverse reactions/side effects for all the Medicines  you take and that have been prescribed to you. Take any new Medicines after you have completely understood and accpet all the possible adverse reactions/side effects.   Do not drive when taking Pain medications.   Do not take more than prescribed Pain, Sleep and Anxiety Medications  Special Instructions: If you have smoked or chewed Tobacco in the last 2 yrs please stop smoking, stop any  regular Alcohol and or any Recreational drug use.  Wear Seat belts while driving.  Please note  You were cared for by a hospitalist during your hospital stay. Once you are discharged, your primary care physician will handle any further medical issues. Please note that NO REFILLS for any discharge medications will be authorized once you are discharged, as it is imperative that you return to your primary care physician (or establish a relationship with a primary care physician if you do not have one) for your aftercare needs so that they can reassess your need for medications and monitor your lab values.  Discharge Instructions    Compression stockings    Complete by:  As directed        Allergies  Allergen Reactions  . Norvasc [Amlodipine Besylate] Swelling    Swelling in ankles  . Quinapril Hcl Swelling  . Ace Inhibitors Swelling and Cough      Disposition: Assisted living with home health   Consults: Trauma Orthopedics EP     Significant Diagnostic Studies:  Dg Chest 1 View  Result Date: 12/29/2015 CLINICAL DATA:  Right-sided rib and right shoulder pain after fall today. EXAM: CHEST 1 VIEW COMPARISON:  10/21/2015 FINDINGS: Left-sided pacemaker unchanged. Lungs are adequately inflated with mild stable interstitial prominence over the mid to lower lungs. Stable hazy opacification over the left base/ retrocardiac region. Mild stable cardiomegaly. Calcified plaque is present over the thoracic aorta. There are moderate degenerate changes of the spine. Stabilization hardware is partially visualized over the lower thoracic spine intact. Multiple old right rib fractures. Acute nondisplaced right posterior third rib fracture. Suggestion of an acute displaced distal right clavicle fracture. IMPRESSION: Stable left base opacification which may be due to effusions/atelectasis, although cannot exclude infection. Chronic interstitial prominence over the mid to lower lungs. Acute  nondisplaced right posterior third rib fracture. Suggestion of a displaced distal right clavicle fracture. Electronically Signed   By: Marin Olp M.D.   On: 12/29/2015 19:10   Dg Chest 2 View  Result Date: 12/30/2015 CLINICAL DATA:  RIGHT-sided chest pain and shortness of breath since falling yesterday question rib fractures, former smoker, hypertension, pacemaker, known interstitial lung disease and stage III chronic kidney disease EXAM: CHEST  2 VIEW COMPARISON:  12/29/2015 FINDINGS: LEFT subclavian transvenous pacemaker leads project at RIGHT atrium and RIGHT ventricle. Enlargement of cardiac silhouette with vascular congestion. Atherosclerotic calcification and tortuosity of thoracic aorta. Emphysematous and bronchitic changes compatible with COPD. Chronic accentuation of RIGHT basilar markings stable. No definite acute infiltrate, pleural effusion or pneumothorax. Bones demineralized with evidence of prior thoracolumbar fusion. Displaced fractures of the lateral RIGHT third and fourth ribs. IMPRESSION: COPD changes with chronic RIGHT basilar changes. No acute infiltrate. Enlargement of cardiac silhouette post pacemaker. Displaced fractures of lateral RIGHT third and fourth ribs. Aortic atherosclerosis. Electronically Signed   By: Lavonia Dana M.D.   On: 12/30/2015 16:53   Dg Ribs Unilateral Right  Result Date: 12/29/2015 CLINICAL DATA:  Right-sided rib pain and right shoulder pain after fall today. EXAM: RIGHT RIBS - 2 VIEW COMPARISON:  Chest x-ray 10/21/2015  FINDINGS: Examination demonstrates suggestion of a distal right clavicle fracture. There is a non displaced fracture of the right posterior fourth rib and possibly the medial aspect of the right posterior third rib. There are multiple old right-sided rib fractures. There are displaced lateral right third and fourth rib fractures. Remainder the exam is unchanged. IMPRESSION: Displaced right lateral third and fourth rib fractures and nondisplaced  right posterior fourth rib fracture. Possible fracture along the posterior medial aspect of the right third rib. Suggestion a distal right clavicle fracture. Electronically Signed   By: Marin Olp M.D.   On: 12/29/2015 19:14   Dg Shoulder Right  Result Date: 12/29/2015 CLINICAL DATA:  Right rib and shoulder pain after fall today. EXAM: RIGHT SHOULDER - 2+ VIEW COMPARISON:  None. FINDINGS: There is a displaced acute distal right clavicle fracture. Evidence of patient's displaced lateral right third and fourth rib fractures. Suggestion of a posterior medial left second and third rib fracture. Degenerative change of the [facility] joint and glenohumeral joints. IMPRESSION: Displaced distal right clavicle fracture. Displaced right lateral third and fourth rib fractures. Suggestion of minimally displaced posterior medial right second and third rib fractures. Electronically Signed   By: Marin Olp M.D.   On: 12/29/2015 19:17   Ct Head Wo Contrast  Result Date: 12/29/2015 CLINICAL DATA:  Fall from toilet with laceration to right-sided head. Right rib and shoulder pain. EXAM: CT HEAD WITHOUT CONTRAST CT CERVICAL SPINE WITHOUT CONTRAST TECHNIQUE: Multidetector CT imaging of the head and cervical spine was performed following the standard protocol without intravenous contrast. Multiplanar CT image reconstructions of the cervical spine were also generated. COMPARISON:  05/04/2015 FINDINGS: CT HEAD FINDINGS Brain: The ventricles and cisterns are within normal. There is minimal age related atrophic change present. There is chronic ischemic microvascular disease. There is no mass, mass effect, shift of midline structures or acute hemorrhage. No evidence of acute infarction. Vascular: Calcified plaque over the cavernous and petrous segments of the internal carotid arteries. Calcified plaque over the vertebral arteries. Skull: No acute fracture. Postsurgical change compatible previous left mastoidectomy with stable residual  density over the surgical bed and middle ear cavity. Bone island over the clivus unchanged. Sinuses/Orbits: Paranasal sinuses are clear. Orbits are within normal. Other: Small right frontal temporal scalp contusion. CT CERVICAL SPINE FINDINGS Alignment: Straightening of the normal cervical lordosis unchanged. Skull base and vertebrae: Mild spondylosis throughout the cervical spine. No acute fracture or traumatic subluxation. Moderate uncovertebral joint spurring and facet arthropathy. Significant bilateral neural foraminal narrowing at multiple levels due to adjacent bony spurring. Soft tissues and spinal canal: Prevertebral soft tissues are within normal. Spinal canal is unremarkable. Disc levels: Disc space narrowing at the C5-6 level and to lesser extent at the C6-7 level unchanged. Upper chest: Minimal emphysematous disease. Suggestion of a nondisplaced fracture of the right posterior fourth rib. Other: IMPRESSION: No acute intracranial findings. Chronic ischemic microvascular disease and mild age related atrophic change. Small right frontotemporal scalp contusion. Stable postsurgical change of the right mastoid sinus. No acute cervical spine injury. Spondylosis throughout the cervical spine with disc disease and multilevel neural foraminal narrowing as described. Suggestion a nondisplaced fracture of the right posterior fourth rib. Electronically Signed   By: Marin Olp M.D.   On: 12/29/2015 19:46   Ct Cervical Spine Wo Contrast  Result Date: 12/29/2015 CLINICAL DATA:  Fall from toilet with laceration to right-sided head. Right rib and shoulder pain. EXAM: CT HEAD WITHOUT CONTRAST CT CERVICAL SPINE WITHOUT CONTRAST TECHNIQUE:  Multidetector CT imaging of the head and cervical spine was performed following the standard protocol without intravenous contrast. Multiplanar CT image reconstructions of the cervical spine were also generated. COMPARISON:  05/04/2015 FINDINGS: CT HEAD FINDINGS Brain: The  ventricles and cisterns are within normal. There is minimal age related atrophic change present. There is chronic ischemic microvascular disease. There is no mass, mass effect, shift of midline structures or acute hemorrhage. No evidence of acute infarction. Vascular: Calcified plaque over the cavernous and petrous segments of the internal carotid arteries. Calcified plaque over the vertebral arteries. Skull: No acute fracture. Postsurgical change compatible previous left mastoidectomy with stable residual density over the surgical bed and middle ear cavity. Bone island over the clivus unchanged. Sinuses/Orbits: Paranasal sinuses are clear. Orbits are within normal. Other: Small right frontal temporal scalp contusion. CT CERVICAL SPINE FINDINGS Alignment: Straightening of the normal cervical lordosis unchanged. Skull base and vertebrae: Mild spondylosis throughout the cervical spine. No acute fracture or traumatic subluxation. Moderate uncovertebral joint spurring and facet arthropathy. Significant bilateral neural foraminal narrowing at multiple levels due to adjacent bony spurring. Soft tissues and spinal canal: Prevertebral soft tissues are within normal. Spinal canal is unremarkable. Disc levels: Disc space narrowing at the C5-6 level and to lesser extent at the C6-7 level unchanged. Upper chest: Minimal emphysematous disease. Suggestion of a nondisplaced fracture of the right posterior fourth rib. Other: IMPRESSION: No acute intracranial findings. Chronic ischemic microvascular disease and mild age related atrophic change. Small right frontotemporal scalp contusion. Stable postsurgical change of the right mastoid sinus. No acute cervical spine injury. Spondylosis throughout the cervical spine with disc disease and multilevel neural foraminal narrowing as described. Suggestion a nondisplaced fracture of the right posterior fourth rib. Electronically Signed   By: Elberta Fortis M.D.   On: 12/29/2015 19:46         Filed Weights   12/29/15 1709 12/29/15 2307 12/30/15 2158  Weight: 53.1 kg (117 lb) 53.1 kg (117 lb 1 oz) 53.5 kg (117 lb 15.1 oz)     Microbiology: Recent Results (from the past 240 hour(s))  MRSA PCR Screening     Status: None   Collection Time: 12/30/15  1:45 AM  Result Value Ref Range Status   MRSA by PCR NEGATIVE NEGATIVE Final    Comment:        The GeneXpert MRSA Assay (FDA approved for NASAL specimens only), is one component of a comprehensive MRSA colonization surveillance program. It is not intended to diagnose MRSA infection nor to guide or monitor treatment for MRSA infections.        Blood Culture    Component Value Date/Time   SDES URINE, RANDOM 05/04/2015 1421   SPECREQUEST NONE 05/04/2015 1421   CULT MULTIPLE SPECIES PRESENT, SUGGEST RECOLLECTION 05/04/2015 1421   REPTSTATUS 05/06/2015 FINAL 05/04/2015 1421      Labs: Results for orders placed or performed during the hospital encounter of 12/29/15 (from the past 48 hour(s))  CBC with Differential     Status: Abnormal   Collection Time: 12/29/15  5:48 PM  Result Value Ref Range   WBC 11.4 (H) 4.0 - 10.5 K/uL   RBC 3.88 3.87 - 5.11 MIL/uL   Hemoglobin 11.5 (L) 12.0 - 15.0 g/dL   HCT 94.2 (L) 49.9 - 84.8 %   MCV 89.7 78.0 - 100.0 fL   MCH 29.6 26.0 - 34.0 pg   MCHC 33.0 30.0 - 36.0 g/dL   RDW 92.4 49.7 - 45.8 %   Platelets  250 150 - 400 K/uL   Neutrophils Relative % 82 %   Neutro Abs 9.2 (H) 1.7 - 7.7 K/uL   Lymphocytes Relative 11 %   Lymphs Abs 1.2 0.7 - 4.0 K/uL   Monocytes Relative 5 %   Monocytes Absolute 0.6 0.1 - 1.0 K/uL   Eosinophils Relative 2 %   Eosinophils Absolute 0.3 0.0 - 0.7 K/uL   Basophils Relative 0 %   Basophils Absolute 0.0 0.0 - 0.1 K/uL  Comprehensive metabolic panel     Status: Abnormal   Collection Time: 12/29/15  5:48 PM  Result Value Ref Range   Sodium 133 (L) 135 - 145 mmol/L   Potassium 4.0 3.5 - 5.1 mmol/L   Chloride 96 (L) 101 - 111 mmol/L   CO2 28  22 - 32 mmol/L   Glucose, Bld 120 (H) 65 - 99 mg/dL   BUN 24 (H) 6 - 20 mg/dL   Creatinine, Ser 1.25 (H) 0.44 - 1.00 mg/dL   Calcium 9.0 8.9 - 10.3 mg/dL   Total Protein 6.9 6.5 - 8.1 g/dL   Albumin 3.4 (L) 3.5 - 5.0 g/dL   AST 21 15 - 41 U/L   ALT 15 14 - 54 U/L   Alkaline Phosphatase 57 38 - 126 U/L   Total Bilirubin 0.4 0.3 - 1.2 mg/dL   GFR calc non Af Amer 36 (L) >60 mL/min   GFR calc Af Amer 42 (L) >60 mL/min    Comment: (NOTE) The eGFR has been calculated using the CKD EPI equation. This calculation has not been validated in all clinical situations. eGFR's persistently <60 mL/min signify possible Chronic Kidney Disease.    Anion gap 9 5 - 15  Brain natriuretic peptide     Status: Abnormal   Collection Time: 12/29/15  5:48 PM  Result Value Ref Range   B Natriuretic Peptide 679.9 (H) 0.0 - 100.0 pg/mL  I-stat troponin, ED     Status: Abnormal   Collection Time: 12/29/15  6:02 PM  Result Value Ref Range   Troponin i, poc 0.16 (HH) 0.00 - 0.08 ng/mL   Comment NOTIFIED PHYSICIAN    Comment 3            Comment: Due to the release kinetics of cTnI, a negative result within the first hours of the onset of symptoms does not rule out myocardial infarction with certainty. If myocardial infarction is still suspected, repeat the test at appropriate intervals.   Urinalysis, Routine w reflex microscopic     Status: Abnormal   Collection Time: 12/29/15  8:41 PM  Result Value Ref Range   Color, Urine YELLOW YELLOW   APPearance CLOUDY (A) CLEAR   Specific Gravity, Urine 1.009 1.005 - 1.030   pH 7.0 5.0 - 8.0   Glucose, UA NEGATIVE NEGATIVE mg/dL   Hgb urine dipstick NEGATIVE NEGATIVE   Bilirubin Urine NEGATIVE NEGATIVE   Ketones, ur NEGATIVE NEGATIVE mg/dL   Protein, ur NEGATIVE NEGATIVE mg/dL   Nitrite NEGATIVE NEGATIVE   Leukocytes, UA MODERATE (A) NEGATIVE  Urine microscopic-add on     Status: Abnormal   Collection Time: 12/29/15  8:41 PM  Result Value Ref Range    Squamous Epithelial / LPF 0-5 (A) NONE SEEN   WBC, UA 6-30 0 - 5 WBC/hpf   RBC / HPF NONE SEEN 0 - 5 RBC/hpf   Bacteria, UA FEW (A) NONE SEEN  TSH     Status: Abnormal   Collection Time: 12/30/15 12:48 AM  Result Value Ref Range   TSH 0.053 (L) 0.350 - 4.500 uIU/mL  Magnesium     Status: Abnormal   Collection Time: 12/30/15 12:48 AM  Result Value Ref Range   Magnesium 1.5 (L) 1.7 - 2.4 mg/dL  Troponin I (q 6hr x 3)     Status: Abnormal   Collection Time: 12/30/15 12:48 AM  Result Value Ref Range   Troponin I 0.18 (HH) <0.03 ng/mL    Comment: CRITICAL RESULT CALLED TO, READ BACK BY AND VERIFIED WITH: Julian Hy K,RN 12/30/15 0141 WAYK   MRSA PCR Screening     Status: None   Collection Time: 12/30/15  1:45 AM  Result Value Ref Range   MRSA by PCR NEGATIVE NEGATIVE    Comment:        The GeneXpert MRSA Assay (FDA approved for NASAL specimens only), is one component of a comprehensive MRSA colonization surveillance program. It is not intended to diagnose MRSA infection nor to guide or monitor treatment for MRSA infections.   Basic metabolic panel     Status: Abnormal   Collection Time: 12/30/15  5:31 AM  Result Value Ref Range   Sodium 136 135 - 145 mmol/L   Potassium 3.3 (L) 3.5 - 5.1 mmol/L    Comment: DELTA CHECK NOTED   Chloride 99 (L) 101 - 111 mmol/L   CO2 27 22 - 32 mmol/L   Glucose, Bld 125 (H) 65 - 99 mg/dL   BUN 22 (H) 6 - 20 mg/dL   Creatinine, Ser 1.11 (H) 0.44 - 1.00 mg/dL   Calcium 8.7 (L) 8.9 - 10.3 mg/dL   GFR calc non Af Amer 41 (L) >60 mL/min   GFR calc Af Amer 48 (L) >60 mL/min    Comment: (NOTE) The eGFR has been calculated using the CKD EPI equation. This calculation has not been validated in all clinical situations. eGFR's persistently <60 mL/min signify possible Chronic Kidney Disease.    Anion gap 10 5 - 15  CBC     Status: Abnormal   Collection Time: 12/30/15  5:31 AM  Result Value Ref Range   WBC 9.2 4.0 - 10.5 K/uL   RBC 3.45 (L) 3.87 -  5.11 MIL/uL   Hemoglobin 9.9 (L) 12.0 - 15.0 g/dL   HCT 30.4 (L) 36.0 - 46.0 %   MCV 88.1 78.0 - 100.0 fL   MCH 28.7 26.0 - 34.0 pg   MCHC 32.6 30.0 - 36.0 g/dL   RDW 13.9 11.5 - 15.5 %   Platelets 195 150 - 400 K/uL  Troponin I (q 6hr x 3)     Status: Abnormal   Collection Time: 12/30/15  5:31 AM  Result Value Ref Range   Troponin I 0.14 (HH) <0.03 ng/mL    Comment: CRITICAL VALUE NOTED.  VALUE IS CONSISTENT WITH PREVIOUSLY REPORTED AND CALLED VALUE.  Glucose, capillary     Status: Abnormal   Collection Time: 12/30/15  7:29 AM  Result Value Ref Range   Glucose-Capillary 105 (H) 65 - 99 mg/dL  Glucose, capillary     Status: Abnormal   Collection Time: 12/30/15  8:18 AM  Result Value Ref Range   Glucose-Capillary 137 (H) 65 - 99 mg/dL  CBC     Status: Abnormal   Collection Time: 12/31/15  4:49 AM  Result Value Ref Range   WBC 7.2 4.0 - 10.5 K/uL   RBC 3.20 (L) 3.87 - 5.11 MIL/uL   Hemoglobin 9.1 (L) 12.0 - 15.0 g/dL   HCT  28.6 (L) 36.0 - 46.0 %   MCV 89.4 78.0 - 100.0 fL   MCH 28.4 26.0 - 34.0 pg   MCHC 31.8 30.0 - 36.0 g/dL   RDW 13.7 11.5 - 15.5 %   Platelets 191 150 - 400 K/uL  Comprehensive metabolic panel     Status: Abnormal   Collection Time: 12/31/15  4:49 AM  Result Value Ref Range   Sodium 133 (L) 135 - 145 mmol/L   Potassium 4.0 3.5 - 5.1 mmol/L    Comment: DELTA CHECK NOTED   Chloride 102 101 - 111 mmol/L   CO2 27 22 - 32 mmol/L   Glucose, Bld 84 65 - 99 mg/dL   BUN 20 6 - 20 mg/dL   Creatinine, Ser 0.98 0.44 - 1.00 mg/dL   Calcium 8.4 (L) 8.9 - 10.3 mg/dL   Total Protein 5.7 (L) 6.5 - 8.1 g/dL   Albumin 2.6 (L) 3.5 - 5.0 g/dL   AST 21 15 - 41 U/L   ALT 12 (L) 14 - 54 U/L   Alkaline Phosphatase 45 38 - 126 U/L   Total Bilirubin 0.4 0.3 - 1.2 mg/dL   GFR calc non Af Amer 48 (L) >60 mL/min   GFR calc Af Amer 56 (L) >60 mL/min    Comment: (NOTE) The eGFR has been calculated using the CKD EPI equation. This calculation has not been validated in all  clinical situations. eGFR's persistently <60 mL/min signify possible Chronic Kidney Disease.    Anion gap 4 (L) 5 - 15  Glucose, capillary     Status: Abnormal   Collection Time: 12/31/15  7:54 AM  Result Value Ref Range   Glucose-Capillary 104 (H) 65 - 99 mg/dL     Lipid Panel  No results found for: CHOL, TRIG, HDL, CHOLHDL, VLDL, LDLCALC, LDLDIRECT   No results found for: HGBA1C   Lab Results  Component Value Date   CREATININE 0.98 12/31/2015     HPI :  Bethany Landry a 82 y.o.femalewith a past medical history significant for CHF EF 20-25%, CKD III, hypothyroidism, and CHB with pacerwho presents with fall and rib fractures, right clavicular fracture. Patient was found to be orthostatic on admission. Pacemaker interrogated and did not show any new events. Patient has been seen by general surgery and EP.Her LV function had decreased from 40% in January to 20% in July  HOSPITAL COURSE  1. Rib fractures and clavicle fracture: Lateral 3-4 rib fractures new. Posterior non-displaced rib fractures as well. Right clavicle fracture. Old rib fractures also noted. Not on anticoagulation  Seen by trauma surgery, continued pain control, pulmonary toilet, sling for clavicle fracture, orthopedic consultation requested, Dr. Sharol Given recommended a sling and follow-up in 2 weeks -Acetaminophen/tramadol scheduled TID -Oxycodone 2.5-5 mg PRN or hydromorphone PRN for pain -Ondansetron for nausea -Supplemental O2 as needed Continue incentive spirometry, continue sling for clavicular fracture Home health ordered   2. Syncope:  orthostatic upon admission, also noted to have low potassium and magnesium, cannot completely rule out arrhythmia Pacer interrogation was normal. Orthostatic vitals showed drop from 160 -->120 with standing. RepleteD electrolytes Patient has been seen by cardiology Abnormal troponin likely secondary to chronic heart failure  3. Elevated  troponin: Secondary to chronic heart failure?Marland Kitchen No chest pain present. ACS not immediately suspected. Patient seen by cardiology    4. HTN: -Continue carvedilol  5. CKD stage II : Stable  6. Chronic hyponatremia: Stable  7. Chronic systolic CHF: EF 28-00% in July. Appears at baseline  weight and no dyspnea or peripheral edema. Will resume Lasix as per cardiology recommendations at a lower dose TED hose/compression stockings for orthostatic hypotension   8. Hypothyroidism: -Continue levothyroxine  9. Chronic protein-calorie malnutrition, severe: -Continue Ensure supplements  10. Hypokalemia-hypomagnesemia-repleted  Continue to follow these electrolytes closely    Discharge Exam:  Blood pressure (!) 158/84, pulse 73, temperature 98.3 F (36.8 C), temperature source Oral, resp. rate 18, height '5\' 3"'$  (1.6 m), weight 53.5 kg (117 lb 15.1 oz), SpO2 98 %.  Cardiac: Irregularly irregular, normal rate, nl S1-S2, no murmurs appreciated.  Capillary refill is brisk.  JVP normal.  No LE edema.  Radial and DP pulses 2+ and symmetric. Respiratory: Normal respiratory rate and rhythm.  CTAB without rales or wheezes. Abdomen: Abdomen soft.  No TTP. No ascites, distension.   MSK: No deformities or effusions.  No cyanosis or clubbing.  There is pain to light palpation over right chest, and pain with movement. Neuro: Cranial nerves grossly normal.  Sensation intact to light touch. Speech is fluent.  Muscle strength globally weak, deconditioned.       Follow-up Information    Newt Minion, MD Follow up in 1 week(s).   Specialty:  Orthopedic Surgery Contact information: [place] [address] [phone]        Ileana Roup, MD. Schedule an appointment as soon as possible for a visit in 2 day(s).   Specialty:  Internal Medicine Why:  Hospital follow-up Contact information: [place]  [address] [place]  [address] [phone]           Signed: Reyne Dumas 12/31/2015, 9:08 AM        Time spent >45 mins

## 2015-12-31 NOTE — Clinical Social Work Note (Signed)
Patient will discharge back to [facility] today. Discharge clinicals transmitted to facility and Mrs. Bethany Landry will be transported by ambulance. Patient's family - daughter-in-law Bethany Landry contacted regarding patient's discharge.  Genelle BalVanessa Oberia Beaudoin, MSW, LCSW Licensed Clinical Social Worker Clinical Social Work Department [facility] [phone]

## 2015-12-31 NOTE — [place] FL2 (Signed)
[place] MEDICAID FL2 LEVEL OF CARE SCREENING TOOL     IDENTIFICATION  Patient Name: Bethany Landry Birthdate: [birth year] Sex: female Admission Date (Current Location): 12/29/2015  [facility] [place]   Facility and Address:  [facility], [address], [place], [place] [address]      Provider Number: [id number]  Attending Physician Name and Address:  Richarda OverlieNayana Abrol, MD  Relative Name and Phone Number:  Ailene ArdsKristie Lindenberger - daughter. Phone #[phone]    Current Level of Care: SNF Recommended Level of Care: Assisted Living Facility [facility]) Prior Approval Number:    Date Approved/Denied:   PASRR Number:    Discharge Plan: Other (Comment) (ALF)    Current Diagnoses: Patient Active Problem List   Diagnosis Date Noted  . Multiple fractures of ribs, right side, initial encounter for closed fracture 12/29/2015  . Closed right clavicular fracture 12/29/2015  . Medication management 11/29/2015  . Hypothyroidism 11/12/2015  . Dysphagia 11/12/2015  . Chronic systolic CHF (congestive heart failure) (HCC) 10/26/2015  . Protein-calorie malnutrition, severe 10/24/2015  . Syncope 05/04/2015  . Elevated systolic blood pressure 05/04/2015  . CKD (chronic kidney disease), stage III 05/04/2015  . Chronic hyponatremia 05/04/2015  . Occipital scalp laceration 05/04/2015  . Interstitial lung disease (HCC) 06/14/2013  . Cardiac pacemaker 06/14/2013  . Complete heart block (HCC) 05/05/2013  . Essential hypertension 05/05/2013    Orientation RESPIRATION BLADDER Height & Weight     Self, Time, Situation, Place  Normal Continent Weight: 117 lb 15.1 oz (53.5 kg) Height:  5\' 3"  (160 cm)  BEHAVIORAL SYMPTOMS/MOOD NEUROLOGICAL BOWEL NUTRITION STATUS      Continent Diet (No added salt)  AMBULATORY STATUS COMMUNICATION OF NEEDS Skin   Limited Assist (Patient is mod assist with ambulation) Verbally Other (Comment) (PU to sacrum)                     Personal Care Assistance Level of Assistance  Bathing, Feeding, Dressing Bathing Assistance: Maximum assistance Feeding assistance: Independent Dressing Assistance: Maximum assistance     Functional Limitations Info  Sight, Hearing, Speech Sight Info: Adequate Hearing Info: Adequate Speech Info: Adequate    SPECIAL CARE FACTORS FREQUENCY  PT (By licensed PT)     PT Frequency: PT evlauted 9/17 and a minimum of 3X per week therapy recommended              Contractures Contractures Info: Not present    Additional Factors Info  Code Status Code Status Info: DNR Allergies Info: Norvasc, Quinapril, Ace Inhibitors           Current Medications (12/31/2015):  This is the current hospital active medication list Current Facility-Administered Medications  Medication Dose Route Frequency Provider Last Rate Last Dose  . acetaminophen (TYLENOL) tablet 1,000 mg  1,000 mg Oral TID Alberteen Samhristopher P Danford, MD   1,000 mg at 12/31/15 1627  . carvedilol (COREG) tablet 3.125 mg  3.125 mg Oral BID WC Alberteen Samhristopher P Danford, MD   3.125 mg at 12/31/15 1627  . feeding supplement (ENSURE ENLIVE) (ENSURE ENLIVE) liquid 237 mL  237 mL Oral BID PRN Barrett HenleNicole [name] Nadeau, PA-C      . heparin injection 5,000 Units  5,000 Units Subcutaneous Q8H Richarda OverlieNayana Abrol, MD   5,000 Units at 12/30/15 2258  . HYDROmorphone (DILAUDID) injection 0.5 mg  0.5 mg Intravenous Q4H PRN Alberteen Samhristopher P Danford, MD      . levothyroxine (SYNTHROID, LEVOTHROID) tablet 125 mcg  125 mcg Oral QAC breakfast Alberteen Sam, MD   125 mcg at 12/31/15 [phone]  . ondansetron (ZOFRAN) tablet 4 mg  4 mg Oral Q6H PRN Alberteen Sam, MD       Or  . ondansetron (ZOFRAN) injection 4 mg  4 mg Intravenous Q6H PRN Alberteen Sam, MD      . oxyCODONE (Oxy IR/ROXICODONE) immediate release tablet 2.5-5 mg  2.5-5 mg Oral Q4H PRN Alberteen Sam, MD   5 mg at 12/30/15 1414  . senna-docusate (Senokot-S) tablet 1  tablet  1 tablet Oral QHS PRN Alberteen Sam, MD      . sodium chloride flush (NS) 0.9 % injection 3 mL  3 mL Intravenous Q12H Alberteen Sam, MD   3 mL at 12/31/15 0923  . traMADol (ULTRAM) tablet 100 mg  100 mg Oral Q12H PRN Richarda Overlie, MD         Discharge Medications: Please see discharge summary for a list of discharge medications.  Relevant Imaging Results:  Relevant Lab Results:   Additional Information: Compression stocking: Patient to continue with compression stockings for orthostatic hypotension, while ambulating  DISCHARGE MEDICATIONS:  START taking these medications   Details  oxyCODONE (OXY IR/ROXICODONE) 5 MG immediate release tablet Take 0.5 tablets (2.5 mg total) by mouth every 6 (six) hours as needed for severe  pain. Qty: 30 tablet, Refills: 0    senna-docusate (SENOKOT-S) 8.6-50 MG tablet Take 1 tablet by mouth at bedtime as needed for mild constipation. Qty: 60 tablet, Refills: 1    traMADol (ULTRAM) 50 MG tablet Take 2 tablets (100 mg total) by mouth every 12 (twelve) hours as needed for moderate pain. Qty: 30 tablet, Refills: 1         CONTINUE these medications which have CHANGED   Details  furosemide (LASIX) 40 MG tablet Take 1 tablet (40 mg total) by mouth daily. Qty: 30 tablet, Refills: 0         CONTINUE these medications which have NOT CHANGED   Details  acetaminophen (TYLENOL) 500 MG tablet Take 500 mg by mouth 2 (two) times daily as needed for mild pain or moderate pain.    aspirin 81 MG chewable tablet Chew 1 tablet (81 mg total) by mouth daily. Qty: 30 tablet, Refills: 0    carvedilol (COREG) 3.125 MG tablet Take 1 tablet (3.125 mg total) by mouth 2 (two) times daily with a meal. Qty: 60 tablet, Refills: 0    ENSURE PLUS (ENSURE PLUS) LIQD Take 237 mLs by mouth 2 (two) times daily as needed (for lack of appetite).    levothyroxine (SYNTHROID, LEVOTHROID) 125 MCG tablet Take 1 TABLET (125 mcg) BY MOUTH ONCE  DAILY Refills: 6    NON FORMULARY Take 240 mLs by mouth as needed (for constipation). Prune Juice             Cristobal Goldmann, LCSW

## 2015-12-31 NOTE — Progress Notes (Signed)
Physical Therapy Treatment Patient Details Name: Bethany Landry MRN: [id number] DOB: 1920/09/22 Today's Date: 12/31/2015    History of Present Illness HPI: 82 yo female with an extensive past medical history presents after syncopal episode.  She was sitting on the commode at her assisted living facility when she felt dizzy and fell to her right side.  Apparently, she landed on her right side, striking the vanity. L 3rd and 4th lateral rib fxs, distal clavicle fx    PT Comments    Noting improving activity tolerance, with better time in upright standing, and able to walk; would still recommend SNF to maximize independence and safety with mobility, however unlikely that it will be covered; noted Case Mgmnt has maximized home health services  Follow Up Recommendations  SNF (not sure that Medicare will cover SNF; in that case, would maximize [facility] resources )     Equipment Recommendations  None recommended by PT    Recommendations for Other Services       Precautions / Restrictions Precautions Precautions: Fall Precaution Comments: Tender R ribs and r clavicle    Mobility  Bed Mobility                  Transfers Overall transfer level: Needs assistance Equipment used: Rolling walker (2 wheeled) Transfers: Sit to/from Stand Sit to Stand: Mod assist;Min assist         General transfer comment: initial sit to stand requiring light mod assist to power up; second time going form sit to stand with min assist to steady; cues for hand placement  Ambulation/Gait Ambulation/Gait assistance: Min assist Ambulation Distance (Feet): 10 Feet Assistive device: Rolling walker (2 wheeled) Gait Pattern/deviations: Step-through pattern;Decreased step length - right;Decreased step length - left     General Gait Details: Cues to self-monitor for activity tolerance; requested to sit at the door, stating she felt "funny"; unable to get a BP in standing at that time   Stairs             Wheelchair Mobility    Modified Rankin (Stroke Patients Only)       Balance                                    Cognition Arousal/Alertness: Awake/alert Behavior During Therapy: WFL for tasks assessed/performed Overall Cognitive Status: Within Functional Limits for tasks assessed (for simple mobility)       Memory: Decreased short-term memory              Exercises      General Comments General comments (skin integrity, edema, etc.):   12/31/15 1351 12/31/15 1359 12/31/15 1404  Vital Signs  Pulse Rate --  82 85  BP --  (!) 148/70 138/66  Orthostatic Lying   BP- Lying 140/67 --  --   Pulse- Lying 72 --  --   Orthostatic Sitting  BP- Sitting 146/79 --  --   Pulse- Sitting 76 --  --   Orthostatic Standing at 0 minutes  BP- Standing at 0 minutes 132/66 --  --   Pulse- Standing at 0 minutes 85 --  --      12/31/15 1406  Vital Signs  Pulse Rate 85  BP 135/66  Orthostatic Lying   BP- Lying --   Pulse- Lying --   Orthostatic Sitting  BP- Sitting --   Pulse- Sitting --   Orthostatic Standing at 0 minutes  BP- Standing at 0 minutes --   Pulse- Standing at 0 minutes --          Pertinent Vitals/Pain Pain Assessment: Faces Faces Pain Scale: Hurts little more Pain Location: R shoulder and ribs Pain Descriptors / Indicators: Aching;Grimacing Pain Intervention(s): Monitored during session;Repositioned    Home Living                      Prior Function            PT Goals (current goals can now be found in the care plan section) Acute Rehab PT Goals Patient Stated Goal: did not state PT Goal Formulation: With patient Time For Goal Achievement: 01/13/16 Potential to Achieve Goals: Good Progress towards PT goals: Progressing toward goals    Frequency    Min 3X/week      PT Plan Current plan remains appropriate    Co-evaluation             End of Session Equipment Utilized During Treatment: Gait belt Activity  Tolerance: Patient tolerated treatment well Patient left: in chair;with call bell/phone within reach;with chair alarm set     Time: 1346-1411 PT Time Calculation (min) (ACUTE ONLY): 25 min  Charges:  $Gait Training: 8-22 mins $Therapeutic Activity: 8-22 mins                    G Codes:      Olen PelGarrigan, Letonia Stead Hamff 12/31/2015, 4:13 PM  Van ClinesHolly Runell Kovich, [place]  Acute Rehabilitation Services Pager [phone] Office [phone]

## 2015-12-31 NOTE — Care Management Note (Signed)
Case Management Note  Patient Details  Name: Vernia BuffDorothy Kilgallon MRN: [id number] Date of Birth: Apr 16, 1920  Subjective/Objective:         CM following for progression and d/c planning.            Action/Plan: 12/31/2015 Call from [facility] rep, Philis FendtMary Y stating that [facility]. As the pt has had a fall she is eligible for [facility] "Safe Strides" fall prevention program. This has been ordered as well as resuming HHRN services.   Expected Discharge Date:  12/31/2015              Expected Discharge Plan:  Assisted Living / Rest Home  In-House Referral:  Clinical Social Work  Discharge planning Services  CM Consult  Post Acute Care Choice:  Home Health Choice offered to:  NA  DME Arranged:  N/A DME Agency:  NA  HH Arranged:  RN, PT, OT, Nurse's Aide [facility]  Status of Service:  Completed, signed off  If discussed at [employer] Meetings, dates discussed:    Additional Comments:  Starlyn SkeansRoyal, Magda Muise U, RN 12/31/2015, 4:07 PM

## 2015-12-31 NOTE — Progress Notes (Signed)
Patient ID: Bethany Landry Landry, female   DOB: 01/21/1921, 82 y.o.   MRN: [id number]  [facility] Progress Note     Subjective: Feeling ok, continues to complain of right-sided rib pain. Pain is worse with deep breaths and activity. Does not seem to be using IS regularly.  Objective: Vital signs in last 24 hours: Temp:  [97.6 F (36.4 C)-98.3 F (36.8 C)] 98.3 F (36.8 C) (09/18 0621) Pulse Rate:  [70-96] 73 (09/18 0621) Resp:  [18-19] 18 (09/18 0621) BP: (122-158)/(60-84) 158/84 (09/18 0621) SpO2:  [95 %-98 %] 98 % (09/18 0621) Weight:  [117 lb 15.1 oz (53.5 kg)] 117 lb 15.1 oz (53.5 kg) (09/17 2158) Last BM Date: 12/29/15  Intake/Output from previous day: 09/17 0701 - 09/18 0700 In: 1200 [P.O.:1200] Out: 300 [Urine:300] Intake/Output this shift: Total I/O In: -  Out: 250 [Urine:250]  PE: Gen:  Alert, NAD, pleasant Pulm:  CTA, no W/R/R Abd: Soft, NT/ND  Lab Results:   Recent Labs  12/30/15 0531 12/31/15 0449  WBC 9.2 7.2  HGB 9.9* 9.1*  HCT 30.4* 28.6*  PLT 195 191   BMET  Recent Labs  12/30/15 0531 12/31/15 0449  NA 136 133*  K 3.3* 4.0  CL 99* 102  CO2 27 27  GLUCOSE 125* 84  BUN 22* 20  CREATININE 1.11* 0.98  CALCIUM 8.7* 8.4*   PT/INR No results for input(s): LABPROT, INR in the last 72 hours. CMP     Component Value Date/Time   NA 133 (L) 12/31/2015 0449   NA 133 (A) 10/30/2015   K 4.0 12/31/2015 0449   CL 102 12/31/2015 0449   CO2 27 12/31/2015 0449   GLUCOSE 84 12/31/2015 0449   BUN 20 12/31/2015 0449   BUN 22 (A) 10/30/2015   CREATININE 0.98 12/31/2015 0449   CALCIUM 8.4 (L) 12/31/2015 0449   PROT 5.7 (L) 12/31/2015 0449   ALBUMIN 2.6 (L) 12/31/2015 0449   AST 21 12/31/2015 0449   ALT 12 (L) 12/31/2015 0449   ALKPHOS 45 12/31/2015 0449   BILITOT 0.4 12/31/2015 0449   GFRNONAA 48 (L) 12/31/2015 0449   GFRAA 56 (L) 12/31/2015 0449   Lipase  No results found for: LIPASE     Studies/Results: Dg Chest 1  View  Result Date: 12/29/2015 CLINICAL DATA:  Right-sided rib and right shoulder pain after fall today. EXAM: CHEST 1 VIEW COMPARISON:  10/21/2015 FINDINGS: Left-sided pacemaker unchanged. Lungs are adequately inflated with mild stable interstitial prominence over the mid to lower lungs. Stable hazy opacification over the left base/ retrocardiac region. Mild stable cardiomegaly. Calcified plaque is present over the thoracic aorta. There are moderate degenerate changes of the spine. Stabilization hardware is partially visualized over the lower thoracic spine intact. Multiple old right rib fractures. Acute nondisplaced right posterior third rib fracture. Suggestion of an acute displaced distal right clavicle fracture. IMPRESSION: Stable left base opacification which may be due to effusions/atelectasis, although cannot exclude infection. Chronic interstitial prominence over the mid to lower lungs. Acute nondisplaced right posterior third rib fracture. Suggestion of a displaced distal right clavicle fracture. Electronically Signed   By: Elberta Fortisaniel  Boyle M.D.   On: 12/29/2015 19:10   Dg Chest 2 View  Result Date: 12/30/2015 CLINICAL DATA:  RIGHT-sided chest pain and shortness of breath since falling yesterday question rib fractures, former smoker, hypertension, pacemaker, known interstitial lung disease and stage III chronic kidney disease EXAM: CHEST  2 VIEW COMPARISON:  12/29/2015 FINDINGS: LEFT subclavian transvenous pacemaker leads project  at RIGHT atrium and RIGHT ventricle. Enlargement of cardiac silhouette with vascular congestion. Atherosclerotic calcification and tortuosity of thoracic aorta. Emphysematous and bronchitic changes compatible with COPD. Chronic accentuation of RIGHT basilar markings stable. No definite acute infiltrate, pleural effusion or pneumothorax. Bones demineralized with evidence of prior thoracolumbar fusion. Displaced fractures of the lateral RIGHT third and fourth ribs. IMPRESSION:  COPD changes with chronic RIGHT basilar changes. No acute infiltrate. Enlargement of cardiac silhouette post pacemaker. Displaced fractures of lateral RIGHT third and fourth ribs. Aortic atherosclerosis. Electronically Signed   By: Ulyses Southward M.D.   On: 12/30/2015 16:53   Dg Ribs Unilateral Right  Result Date: 12/29/2015 CLINICAL DATA:  Right-sided rib pain and right shoulder pain after fall today. EXAM: RIGHT RIBS - 2 VIEW COMPARISON:  Chest x-ray 10/21/2015 FINDINGS: Examination demonstrates suggestion of a distal right clavicle fracture. There is a non displaced fracture of the right posterior fourth rib and possibly the medial aspect of the right posterior third rib. There are multiple old right-sided rib fractures. There are displaced lateral right third and fourth rib fractures. Remainder the exam is unchanged. IMPRESSION: Displaced right lateral third and fourth rib fractures and nondisplaced right posterior fourth rib fracture. Possible fracture along the posterior medial aspect of the right third rib. Suggestion a distal right clavicle fracture. Electronically Signed   By: Elberta Fortis M.D.   On: 12/29/2015 19:14   Dg Shoulder Right  Result Date: 12/29/2015 CLINICAL DATA:  Right rib and shoulder pain after fall today. EXAM: RIGHT SHOULDER - 2+ VIEW COMPARISON:  None. FINDINGS: There is a displaced acute distal right clavicle fracture. Evidence of patient's displaced lateral right third and fourth rib fractures. Suggestion of a posterior medial left second and third rib fracture. Degenerative change of the [facility] joint and glenohumeral joints. IMPRESSION: Displaced distal right clavicle fracture. Displaced right lateral third and fourth rib fractures. Suggestion of minimally displaced posterior medial right second and third rib fractures. Electronically Signed   By: Elberta Fortis M.D.   On: 12/29/2015 19:17   Ct Head Wo Contrast  Result Date: 12/29/2015 CLINICAL DATA:  Fall from toilet with  laceration to right-sided head. Right rib and shoulder pain. EXAM: CT HEAD WITHOUT CONTRAST CT CERVICAL SPINE WITHOUT CONTRAST TECHNIQUE: Multidetector CT imaging of the head and cervical spine was performed following the standard protocol without intravenous contrast. Multiplanar CT image reconstructions of the cervical spine were also generated. COMPARISON:  05/04/2015 FINDINGS: CT HEAD FINDINGS Brain: The ventricles and cisterns are within normal. There is minimal age related atrophic change present. There is chronic ischemic microvascular disease. There is no mass, mass effect, shift of midline structures or acute hemorrhage. No evidence of acute infarction. Vascular: Calcified plaque over the cavernous and petrous segments of the internal carotid arteries. Calcified plaque over the vertebral arteries. Skull: No acute fracture. Postsurgical change compatible previous left mastoidectomy with stable residual density over the surgical bed and middle ear cavity. Bone island over the clivus unchanged. Sinuses/Orbits: Paranasal sinuses are clear. Orbits are within normal. Other: Small right frontal temporal scalp contusion. CT CERVICAL SPINE FINDINGS Alignment: Straightening of the normal cervical lordosis unchanged. Skull base and vertebrae: Mild spondylosis throughout the cervical spine. No acute fracture or traumatic subluxation. Moderate uncovertebral joint spurring and facet arthropathy. Significant bilateral neural foraminal narrowing at multiple levels due to adjacent bony spurring. Soft tissues and spinal canal: Prevertebral soft tissues are within normal. Spinal canal is unremarkable. Disc levels: Disc space narrowing at the C5-6 level  and to lesser extent at the C6-7 level unchanged. Upper chest: Minimal emphysematous disease. Suggestion of a nondisplaced fracture of the right posterior fourth rib. Other: IMPRESSION: No acute intracranial findings. Chronic ischemic microvascular disease and mild age related  atrophic change. Small right frontotemporal scalp contusion. Stable postsurgical change of the right mastoid sinus. No acute cervical spine injury. Spondylosis throughout the cervical spine with disc disease and multilevel neural foraminal narrowing as described. Suggestion a nondisplaced fracture of the right posterior fourth rib. Electronically Signed   By: Elberta Fortis M.D.   On: 12/29/2015 19:46   Ct Cervical Spine Wo Contrast  Result Date: 12/29/2015 CLINICAL DATA:  Fall from toilet with laceration to right-sided head. Right rib and shoulder pain. EXAM: CT HEAD WITHOUT CONTRAST CT CERVICAL SPINE WITHOUT CONTRAST TECHNIQUE: Multidetector CT imaging of the head and cervical spine was performed following the standard protocol without intravenous contrast. Multiplanar CT image reconstructions of the cervical spine were also generated. COMPARISON:  05/04/2015 FINDINGS: CT HEAD FINDINGS Brain: The ventricles and cisterns are within normal. There is minimal age related atrophic change present. There is chronic ischemic microvascular disease. There is no mass, mass effect, shift of midline structures or acute hemorrhage. No evidence of acute infarction. Vascular: Calcified plaque over the cavernous and petrous segments of the internal carotid arteries. Calcified plaque over the vertebral arteries. Skull: No acute fracture. Postsurgical change compatible previous left mastoidectomy with stable residual density over the surgical bed and middle ear cavity. Bone island over the clivus unchanged. Sinuses/Orbits: Paranasal sinuses are clear. Orbits are within normal. Other: Small right frontal temporal scalp contusion. CT CERVICAL SPINE FINDINGS Alignment: Straightening of the normal cervical lordosis unchanged. Skull base and vertebrae: Mild spondylosis throughout the cervical spine. No acute fracture or traumatic subluxation. Moderate uncovertebral joint spurring and facet arthropathy. Significant bilateral neural  foraminal narrowing at multiple levels due to adjacent bony spurring. Soft tissues and spinal canal: Prevertebral soft tissues are within normal. Spinal canal is unremarkable. Disc levels: Disc space narrowing at the C5-6 level and to lesser extent at the C6-7 level unchanged. Upper chest: Minimal emphysematous disease. Suggestion of a nondisplaced fracture of the right posterior fourth rib. Other: IMPRESSION: No acute intracranial findings. Chronic ischemic microvascular disease and mild age related atrophic change. Small right frontotemporal scalp contusion. Stable postsurgical change of the right mastoid sinus. No acute cervical spine injury. Spondylosis throughout the cervical spine with disc disease and multilevel neural foraminal narrowing as described. Suggestion a nondisplaced fracture of the right posterior fourth rib. Electronically Signed   By: Elberta Fortis M.D.   On: 12/29/2015 19:46    Anti-infectives: Anti-infectives    None       Assessment/Plan Fall - syncopal episode Lateral right third and fourth rib fractures - XR yesterday showed displaced right 3rd and 4th rib fractures, no PNX - evaluated by PT yesterday - continue pain control, pulmonary toilet/IS Right distal clavicle fracture - per ortho nonop, fling PRN, follow-up 2 weeks  Plan - continue pulmonary toilet/IS, pain control. Per internal medicine patient may be discharged today.   LOS: 0 days    Edson Snowball , [facility] 12/31/2015, 9:27 AM Pager: [phone] Consults: [phone] Mon-Fri 7:00 am-4:30 pm Sat-Sun 7:00 am-11:30 am

## 2015-12-31 NOTE — Progress Notes (Signed)
F/u scheduled for Monday October 2nd at 9:30am with Norma FredricksonLori Gerhardt, NP. Appt listed under AVS (confirmed that nurse did not print yet). Will keep appt 03/26/16 with Dr. [name] as scheduled.  Dayna Dunn PA-C

## 2015-12-31 NOTE — Progress Notes (Signed)
  Echocardiogram 2D Echocardiogram has been performed.  Leta JunglingCooper, Aseel Uhde M 12/31/2015, 3:35 PM

## 2015-12-31 NOTE — Progress Notes (Signed)
Patient being discharged back to [facility], [facility]. Family aware of discharge plan, waiting for [facility] to pick patient up for transport.  Derryck Shahan, Kae HellerMiranda Lynn, RN

## 2015-12-31 NOTE — Clinical Social Work Note (Signed)
Clinical Social Work Assessment  Patient Details  Name: Bethany Landry MRN: [id number] Date of Birth: Oct 15, 1920  Date of referral:  12/31/15               Reason for consult:  Facility Placement                Permission sought to share information with:  Family Supports Permission granted to share information::  Yes, Verbal Permission Granted  Name::     Bethany Landry and Bethany Landry  Agency::     Relationship::  Son and daughter-in-law  Contact Information:  [phone]  Housing/Transportation Living arrangements for the past 2 months:  Assisted Living Facility ([facility]) Source of Information:  Patient, Other (Comment Required) (Chart) Patient Interpreter Needed:  None Criminal Activity/Legal Involvement Pertinent to Current Situation/Hospitalization:  No - Comment as needed Significant Relationships:  Adult Children, Other Family Members Lives with:  Facility Resident (ALF) Do you feel safe going back to the place where you live?  Yes Need for family participation in patient care:  Yes (Comment)  Care giving concerns:  Patient expressed no concerns regarding her care at [facility]. Mrs. Bethany Landry explained that she has been at facility for 3/4 years and was moved to the 'Senior' section last week.  Assessment / plan:  Patient indicated that she is from [facility] and it is her plan to return to this facility. When asked, patient reported that the care there is good. When asked, Mrs. Bethany Landry informed CSW that she has 6 children total (3 girls and 3 boys). All of her daughters live in [place] and her sons live in [place] and [place], and one son lives here in [place] with his wife. She reported that he is a retired Development worker, communityphysician.   Employment status:  Retired Health and safety inspectornsurance information:  [employer]are, Engineer, miningManaged Care (AARP) PT Recommendations:  Skilled Nursing Facility (Patient will return to ALF with rehab services) Information / Referral to community resources:  Other (Comment  Required) (None needed or requested as patient from ALF)  Patient/Family's Response to care:  Mrs. Bethany Landry expressed no concerns regarding her care during this hospitalization.  Patient/Family's Understanding of and Emotional Response to Diagnosis, Current Treatment, and Prognosis:  Not discussed.  Emotional Assessment Appearance:  Appears stated age Attitude/Demeanor/Rapport:  Other (Appropriate) Affect (typically observed):  Pleasant, Appropriate Orientation:  Oriented to Self, Oriented to Place, Oriented to  Time, Oriented to Situation Alcohol / Substance use:  Never Used Psych involvement (Current and /or in the community):  No (Comment)  Discharge Needs  Concerns to be addressed:  Discharge Planning Concerns Readmission within the last 30 days:  No Current discharge risk:  None Barriers to Discharge:  No Barriers Identified   Bethany Landry, Bethany Scholz Bradley, LCSW 12/31/2015, 5:24 PM

## 2015-12-31 NOTE — Progress Notes (Signed)
[facility]  Here for transport,patient dressed and placed on stretcher. No complaints voiced,and belongings sent with patient.

## 2015-12-31 NOTE — NC FL2 (Signed)
MEDICAID FL2 LEVEL OF CARE SCREENING TOOL     IDENTIFICATION  Patient Name: Bethany Landry Birthdate: 1920-09-03 Sex: female Admission Date (Current Location): 12/29/2015  [facility] [place]   Facility and Address:  [facility], [address], [place], [place] [address]      Provider Number: [id number]  Attending Physician Name and Address:  Richarda OverlieNayana Abrol, MD  Relative Name and Phone Number:  Nolley,Kristie Daughter [phone]     Current Level of Care: Hospital Recommended Level of Care: Assisted Living Facility ([facility]) Prior Approval Number:    Date Approved/Denied:   PASRR Number:    Discharge Plan: Other (Comment) (ALF)    Current Diagnoses: Patient Active Problem List   Diagnosis Date Noted  . Multiple fractures of ribs, right side, initial encounter for closed fracture 12/29/2015  . Closed right clavicular fracture 12/29/2015  . Medication management 11/29/2015  . Hypothyroidism 11/12/2015  . Dysphagia 11/12/2015  . Chronic systolic CHF (congestive heart failure) (HCC) 10/26/2015  . Protein-calorie malnutrition, severe 10/24/2015  . Syncope 05/04/2015  . Elevated systolic blood pressure 05/04/2015  . CKD (chronic kidney disease), stage III 05/04/2015  . Chronic hyponatremia 05/04/2015  . Occipital scalp laceration 05/04/2015  . Interstitial lung disease (HCC) 06/14/2013  . Cardiac pacemaker 06/14/2013  . Complete heart block (HCC) 05/05/2013  . Essential hypertension 05/05/2013    Orientation RESPIRATION BLADDER Height & Weight     Self, Time, Situation, Place  Normal Continent Weight: 117 lb 15.1 oz (53.5 kg) Height:  5\' 3"  (160 cm)  BEHAVIORAL SYMPTOMS/MOOD NEUROLOGICAL BOWEL NUTRITION STATUS      Continent Diet: No Added Salt  AMBULATORY STATUS COMMUNICATION OF NEEDS Skin   Mod. Assist Verbally Other (Comment) (PU to sacrum)                       Personal Care  Assistance Level of Assistance  Bathing, Feeding, Dressing Bathing Assistance: Maximum assistance Feeding assistance: Independent Dressing Assistance: Maximum assistance     Functional Limitations Info  Sight, Hearing, Speech Sight Info: Adequate Hearing Info: Adequate Speech Info: Adequate    SPECIAL CARE FACTORS FREQUENCY  PT (By licensed PT)     PT Frequency: Evaluated 9/17 and a minimum of 3X per week therapy recommended              Contractures Contractures Info: Not present    Additional Factors Info  Code Status, Allergies Code Status Info: DNR Allergies Info: Norvasc, Quinapril, Ace Inhibitors           Current Medications (12/31/2015):  This is the current hospital active medication list Current Facility-Administered Medications  Medication Dose Route Frequency Provider Last Rate Last Dose  . acetaminophen (TYLENOL) tablet 1,000 mg  1,000 mg Oral TID Alberteen Samhristopher P Danford, MD   1,000 mg at 12/31/15 0923  . carvedilol (COREG) tablet 3.125 mg  3.125 mg Oral BID WC Alberteen Samhristopher P Danford, MD   3.125 mg at 12/31/15 [id number]  . feeding supplement (ENSURE ENLIVE) (ENSURE ENLIVE) liquid 237 mL  237 mL Oral BID PRN Barrett HenleNicole [name] Nadeau, PA-C      . heparin injection 5,000 Units  5,000 Units Subcutaneous Q8H Richarda OverlieNayana Abrol, MD   5,000 Units at 12/30/15 2258  . HYDROmorphone (DILAUDID) injection 0.5 mg  0.5 mg Intravenous Q4H PRN Alberteen Samhristopher P Danford, MD      . levothyroxine (SYNTHROID, LEVOTHROID) tablet 125 mcg  125 mcg  Oral QAC breakfast Alberteen Sam, MD   125 mcg at 12/31/15 [id number]  . ondansetron (ZOFRAN) tablet 4 mg  4 mg Oral Q6H PRN Alberteen Sam, MD       Or  . ondansetron (ZOFRAN) injection 4 mg  4 mg Intravenous Q6H PRN Alberteen Sam, MD      . oxyCODONE (Oxy IR/ROXICODONE) immediate release tablet 2.5-5 mg  2.5-5 mg Oral Q4H PRN Alberteen Sam, MD   5 mg at 12/30/15 1414  . senna-docusate (Senokot-S) tablet 1 tablet  1 tablet Oral  QHS PRN Alberteen Sam, MD      . sodium chloride flush (NS) 0.9 % injection 3 mL  3 mL Intravenous Q12H Alberteen Sam, MD   3 mL at 12/31/15 0923  . traMADol (ULTRAM) tablet 100 mg  100 mg Oral Q12H PRN Richarda Overlie, MD         Discharge Medications: Please see discharge summary for a list of discharge medications.  Relevant Imaging Results:  Relevant Lab Results:   Additional Information DISCHARGE MEDICATIONS:  START taking these medications  START taking these medications   Details  oxyCODONE (OXY IR/ROXICODONE) 5 MG immediate release tablet Take 0.5 tablets (2.5 mg total) by mouth every 6 (six) hours as needed for moderate pain. Qty: 30 tablet, Refills: 0    senna-docusate (SENOKOT-S) 8.6-50 MG tablet Take 1 tablet by mouth at bedtime as needed for mild constipation. Qty: 60 tablet, Refills: 1    traMADol (ULTRAM) 50 MG tablet Take 2 tablets (100 mg total) by mouth every 12 (twelve) hours as needed for moderate pain. Qty: 30 tablet, Refills: 1         CONTINUE these medications which have CHANGED   Details  furosemide (LASIX) 40 MG tablet Take 1 tablet (40 mg total) by mouth daily. Qty: 30 tablet, Refills: 0         CONTINUE these medications which have NOT CHANGED   Details  acetaminophen (TYLENOL) 500 MG tablet Take 500 mg by mouth 2 (two) times daily as needed for mild pain or moderate pain.    aspirin 81 MG chewable tablet Chew 1 tablet (81 mg total) by mouth daily. Qty: 30 tablet, Refills: 0    carvedilol (COREG) 3.125 MG tablet Take 1 tablet (3.125 mg total) by mouth 2 (two) times daily with a meal. Qty: 60 tablet, Refills: 0    ENSURE PLUS (ENSURE PLUS) LIQD Take 237 mLs by mouth 2 (two) times daily as needed (for lack of appetite).    levothyroxine (SYNTHROID, LEVOTHROID) 125 MCG tablet Take 1 TABLET (125 mcg) BY MOUTH ONCE DAILY Refills: 6    NON FORMULARY Take 240 mLs by mouth as needed (for constipation). Prune Juice         Cristobal Goldmann, LCSW

## 2016-01-07 ENCOUNTER — Other Ambulatory Visit (HOSPITAL_COMMUNITY): Payer: Self-pay | Admitting: Family

## 2016-01-13 ENCOUNTER — Inpatient Hospital Stay (HOSPITAL_COMMUNITY)
Admission: EM | Admit: 2016-01-13 | Discharge: 2016-01-16 | DRG: 065 | Payer: Medicare Other | Attending: Internal Medicine | Admitting: Internal Medicine

## 2016-01-13 ENCOUNTER — Emergency Department (HOSPITAL_COMMUNITY): Payer: Medicare Other

## 2016-01-13 DIAGNOSIS — S42001P Fracture of unspecified part of right clavicle, subsequent encounter for fracture with malunion: Secondary | ICD-10-CM

## 2016-01-13 DIAGNOSIS — Z950 Presence of cardiac pacemaker: Secondary | ICD-10-CM | POA: Diagnosis not present

## 2016-01-13 DIAGNOSIS — J939 Pneumothorax, unspecified: Secondary | ICD-10-CM

## 2016-01-13 DIAGNOSIS — N183 Chronic kidney disease, stage 3 unspecified: Secondary | ICD-10-CM | POA: Diagnosis present

## 2016-01-13 DIAGNOSIS — E785 Hyperlipidemia, unspecified: Secondary | ICD-10-CM | POA: Diagnosis present

## 2016-01-13 DIAGNOSIS — G8194 Hemiplegia, unspecified affecting left nondominant side: Secondary | ICD-10-CM | POA: Diagnosis present

## 2016-01-13 DIAGNOSIS — I63411 Cerebral infarction due to embolism of right middle cerebral artery: Secondary | ICD-10-CM | POA: Diagnosis not present

## 2016-01-13 DIAGNOSIS — E890 Postprocedural hypothyroidism: Secondary | ICD-10-CM | POA: Diagnosis present

## 2016-01-13 DIAGNOSIS — S42009A Fracture of unspecified part of unspecified clavicle, initial encounter for closed fracture: Secondary | ICD-10-CM | POA: Diagnosis present

## 2016-01-13 DIAGNOSIS — S42001A Fracture of unspecified part of right clavicle, initial encounter for closed fracture: Secondary | ICD-10-CM | POA: Diagnosis present

## 2016-01-13 DIAGNOSIS — I5022 Chronic systolic (congestive) heart failure: Secondary | ICD-10-CM | POA: Diagnosis not present

## 2016-01-13 DIAGNOSIS — I10 Essential (primary) hypertension: Secondary | ICD-10-CM | POA: Diagnosis present

## 2016-01-13 DIAGNOSIS — W19XXXA Unspecified fall, initial encounter: Secondary | ICD-10-CM | POA: Diagnosis present

## 2016-01-13 DIAGNOSIS — Z888 Allergy status to other drugs, medicaments and biological substances status: Secondary | ICD-10-CM

## 2016-01-13 DIAGNOSIS — Z9889 Other specified postprocedural states: Secondary | ICD-10-CM

## 2016-01-13 DIAGNOSIS — E871 Hypo-osmolality and hyponatremia: Secondary | ICD-10-CM | POA: Diagnosis not present

## 2016-01-13 DIAGNOSIS — Z87891 Personal history of nicotine dependence: Secondary | ICD-10-CM

## 2016-01-13 DIAGNOSIS — I442 Atrioventricular block, complete: Secondary | ICD-10-CM | POA: Diagnosis present

## 2016-01-13 DIAGNOSIS — R471 Dysarthria and anarthria: Secondary | ICD-10-CM | POA: Diagnosis present

## 2016-01-13 DIAGNOSIS — R29703 NIHSS score 3: Secondary | ICD-10-CM | POA: Diagnosis present

## 2016-01-13 DIAGNOSIS — Z79899 Other long term (current) drug therapy: Secondary | ICD-10-CM

## 2016-01-13 DIAGNOSIS — Z9849 Cataract extraction status, unspecified eye: Secondary | ICD-10-CM

## 2016-01-13 DIAGNOSIS — R748 Abnormal levels of other serum enzymes: Secondary | ICD-10-CM | POA: Diagnosis present

## 2016-01-13 DIAGNOSIS — I69354 Hemiplegia and hemiparesis following cerebral infarction affecting left non-dominant side: Secondary | ICD-10-CM

## 2016-01-13 DIAGNOSIS — I130 Hypertensive heart and chronic kidney disease with heart failure and stage 1 through stage 4 chronic kidney disease, or unspecified chronic kidney disease: Secondary | ICD-10-CM | POA: Diagnosis present

## 2016-01-13 DIAGNOSIS — H9191 Unspecified hearing loss, right ear: Secondary | ICD-10-CM | POA: Diagnosis present

## 2016-01-13 DIAGNOSIS — Z7982 Long term (current) use of aspirin: Secondary | ICD-10-CM

## 2016-01-13 DIAGNOSIS — M810 Age-related osteoporosis without current pathological fracture: Secondary | ICD-10-CM | POA: Diagnosis present

## 2016-01-13 DIAGNOSIS — Z66 Do not resuscitate: Secondary | ICD-10-CM | POA: Diagnosis present

## 2016-01-13 DIAGNOSIS — R29810 Facial weakness: Secondary | ICD-10-CM | POA: Diagnosis present

## 2016-01-13 DIAGNOSIS — Y929 Unspecified place or not applicable: Secondary | ICD-10-CM

## 2016-01-13 DIAGNOSIS — I639 Cerebral infarction, unspecified: Secondary | ICD-10-CM | POA: Diagnosis not present

## 2016-01-13 DIAGNOSIS — F0390 Unspecified dementia without behavioral disturbance: Secondary | ICD-10-CM | POA: Diagnosis present

## 2016-01-13 DIAGNOSIS — R531 Weakness: Secondary | ICD-10-CM | POA: Diagnosis not present

## 2016-01-13 DIAGNOSIS — Z515 Encounter for palliative care: Secondary | ICD-10-CM

## 2016-01-13 DIAGNOSIS — R414 Neurologic neglect syndrome: Secondary | ICD-10-CM | POA: Insufficient documentation

## 2016-01-13 DIAGNOSIS — E039 Hypothyroidism, unspecified: Secondary | ICD-10-CM | POA: Diagnosis present

## 2016-01-13 DIAGNOSIS — Z8249 Family history of ischemic heart disease and other diseases of the circulatory system: Secondary | ICD-10-CM

## 2016-01-13 DIAGNOSIS — Z7189 Other specified counseling: Secondary | ICD-10-CM

## 2016-01-13 LAB — COMPREHENSIVE METABOLIC PANEL
ALT: 13 U/L — ABNORMAL LOW (ref 14–54)
ANION GAP: 10 (ref 5–15)
AST: 20 U/L (ref 15–41)
Albumin: 3.2 g/dL — ABNORMAL LOW (ref 3.5–5.0)
Alkaline Phosphatase: 95 U/L (ref 38–126)
BILIRUBIN TOTAL: 0.7 mg/dL (ref 0.3–1.2)
BUN: 20 mg/dL (ref 6–20)
CO2: 26 mmol/L (ref 22–32)
Calcium: 8.6 mg/dL — ABNORMAL LOW (ref 8.9–10.3)
Chloride: 90 mmol/L — ABNORMAL LOW (ref 101–111)
Creatinine, Ser: 1.39 mg/dL — ABNORMAL HIGH (ref 0.44–1.00)
GFR calc Af Amer: 37 mL/min — ABNORMAL LOW (ref >60)
GFR, EST NON AFRICAN AMERICAN: 32 mL/min — AB (ref >60)
Glucose, Bld: 120 mg/dL — ABNORMAL HIGH (ref 65–99)
POTASSIUM: 4.4 mmol/L (ref 3.5–5.1)
Sodium: 126 mmol/L — ABNORMAL LOW (ref 135–145)
TOTAL PROTEIN: 7 g/dL (ref 6.5–8.1)

## 2016-01-13 LAB — DIFFERENTIAL
Basophils Absolute: 0 10*3/uL (ref 0.0–0.1)
Basophils Relative: 0 %
EOS ABS: 1.1 10*3/uL — AB (ref 0.0–0.7)
EOS PCT: 11 %
LYMPHS ABS: 1.1 10*3/uL (ref 0.7–4.0)
Lymphocytes Relative: 11 %
MONOS PCT: 7 %
Monocytes Absolute: 0.7 10*3/uL (ref 0.1–1.0)
NEUTROS PCT: 71 %
Neutro Abs: 6.8 10*3/uL (ref 1.7–7.7)

## 2016-01-13 LAB — PROTIME-INR
INR: 1.08
Prothrombin Time: 14.1 seconds (ref 11.4–15.2)

## 2016-01-13 LAB — URINALYSIS, ROUTINE W REFLEX MICROSCOPIC
Bilirubin Urine: NEGATIVE
Glucose, UA: NEGATIVE mg/dL
Hgb urine dipstick: NEGATIVE
KETONES UR: NEGATIVE mg/dL
NITRITE: NEGATIVE
PH: 6 (ref 5.0–8.0)
Protein, ur: NEGATIVE mg/dL

## 2016-01-13 LAB — URINE MICROSCOPIC-ADD ON

## 2016-01-13 LAB — I-STAT CHEM 8, ED
BUN: 30 mg/dL — ABNORMAL HIGH (ref 6–20)
CREATININE: 1.4 mg/dL — AB (ref 0.44–1.00)
Calcium, Ion: 1.03 mmol/L — ABNORMAL LOW (ref 1.15–1.40)
Chloride: 91 mmol/L — ABNORMAL LOW (ref 101–111)
Glucose, Bld: 116 mg/dL — ABNORMAL HIGH (ref 65–99)
HEMATOCRIT: 37 % (ref 36.0–46.0)
HEMOGLOBIN: 12.6 g/dL (ref 12.0–15.0)
Potassium: 4.7 mmol/L (ref 3.5–5.1)
Sodium: 130 mmol/L — ABNORMAL LOW (ref 135–145)
TCO2: 33 mmol/L (ref 0–100)

## 2016-01-13 LAB — CBC
HEMATOCRIT: 36.3 % (ref 36.0–46.0)
HEMOGLOBIN: 11.7 g/dL — AB (ref 12.0–15.0)
MCH: 29.3 pg (ref 26.0–34.0)
MCHC: 32.2 g/dL (ref 30.0–36.0)
MCV: 91 fL (ref 78.0–100.0)
Platelets: 379 10*3/uL (ref 150–400)
RBC: 3.99 MIL/uL (ref 3.87–5.11)
RDW: 13.8 % (ref 11.5–15.5)
WBC: 9.7 10*3/uL (ref 4.0–10.5)

## 2016-01-13 LAB — RAPID URINE DRUG SCREEN, HOSP PERFORMED
Amphetamines: NOT DETECTED
Barbiturates: NOT DETECTED
Benzodiazepines: NOT DETECTED
Cocaine: NOT DETECTED
OPIATES: NOT DETECTED
Tetrahydrocannabinol: NOT DETECTED

## 2016-01-13 LAB — I-STAT TROPONIN, ED: TROPONIN I, POC: 0.16 ng/mL — AB (ref 0.00–0.08)

## 2016-01-13 LAB — TROPONIN I: Troponin I: 0.17 ng/mL (ref <0.03)

## 2016-01-13 LAB — APTT: aPTT: 23 seconds — ABNORMAL LOW (ref 24–36)

## 2016-01-13 LAB — ETHANOL

## 2016-01-13 MED ORDER — ENOXAPARIN SODIUM 30 MG/0.3ML ~~LOC~~ SOLN
30.0000 mg | Freq: Every day | SUBCUTANEOUS | Status: DC
  Administered 2016-01-14: 30 mg via SUBCUTANEOUS
  Filled 2016-01-13: qty 0.3

## 2016-01-13 MED ORDER — BACITRACIN-NEOMYCIN-POLYMYXIN OINTMENT TUBE
1.0000 "application " | TOPICAL_OINTMENT | Freq: Every day | CUTANEOUS | Status: DC
  Administered 2016-01-16: 1 via TOPICAL
  Filled 2016-01-13: qty 15

## 2016-01-13 MED ORDER — SODIUM CHLORIDE 0.9 % IV BOLUS (SEPSIS)
500.0000 mL | Freq: Once | INTRAVENOUS | Status: AC
  Administered 2016-01-13: 500 mL via INTRAVENOUS

## 2016-01-13 MED ORDER — SODIUM CHLORIDE 0.9 % IV SOLN
INTRAVENOUS | Status: DC
  Administered 2016-01-14: 01:00:00 via INTRAVENOUS

## 2016-01-13 MED ORDER — SODIUM CHLORIDE 0.9 % IV SOLN
1.0000 g | Freq: Once | INTRAVENOUS | Status: AC
  Administered 2016-01-14: 1 g via INTRAVENOUS
  Filled 2016-01-13: qty 10

## 2016-01-13 MED ORDER — ENSURE ENLIVE PO LIQD: 237.0000 mL | Freq: Two times a day (BID) | ORAL | Status: DC | PRN

## 2016-01-13 MED ORDER — ASPIRIN 81 MG PO CHEW
81.0000 mg | CHEWABLE_TABLET | Freq: Every day | ORAL | Status: DC
  Filled 2016-01-13: qty 1

## 2016-01-13 MED ORDER — ACETAMINOPHEN 500 MG PO TABS: 500.0000 mg | ORAL_TABLET | Freq: Three times a day (TID) | ORAL | Status: DC | PRN

## 2016-01-13 MED ORDER — SENNOSIDES-DOCUSATE SODIUM 8.6-50 MG PO TABS: 1.0000 | ORAL_TABLET | Freq: Every evening | ORAL | Status: DC | PRN

## 2016-01-13 MED ORDER — STROKE: EARLY STAGES OF RECOVERY BOOK
Freq: Once | Status: DC
  Filled 2016-01-13: qty 1

## 2016-01-13 MED ORDER — LOPERAMIDE HCL 2 MG PO CAPS: 2.0000 mg | ORAL_CAPSULE | ORAL | Status: DC | PRN

## 2016-01-13 MED ORDER — CARVEDILOL 3.125 MG PO TABS
3.1250 mg | ORAL_TABLET | Freq: Two times a day (BID) | ORAL | Status: DC
  Administered 2016-01-16: 3.125 mg via ORAL
  Filled 2016-01-13 (×3): qty 1

## 2016-01-13 MED ORDER — LEVOTHYROXINE SODIUM 25 MCG PO TABS
125.0000 ug | ORAL_TABLET | Freq: Every day | ORAL | Status: DC
  Filled 2016-01-13 (×2): qty 1

## 2016-01-13 MED ORDER — LEVOTHYROXINE SODIUM 125 MCG PO TABS: 125.0000 ug | ORAL_TABLET | Freq: Every day | ORAL | Status: DC

## 2016-01-13 MED ORDER — FUROSEMIDE 20 MG PO TABS
40.0000 mg | ORAL_TABLET | Freq: Two times a day (BID) | ORAL | Status: DC
  Filled 2016-01-13 (×2): qty 2

## 2016-01-13 NOTE — H&P (Addendum)
History and Physical    [place] Searles ZOX:[id number] DOB: [birth year] DOA: 01/13/2016  Referring MD/NP/PA: Dr. Jacqulyn Bath  PCP: Clelia Schaumann, MD  Patient coming from: [facility]  Chief Complaint: Left sided weakness  HPI: Bethany Landry is a 82 y.o. female with medical history significant of sys & dys CHF EF 25-30%, CKD stage III, hypothyroidism, and s/p PM for complete heart block; who presents for sudden onset of left-sided weakness. History is obtained by review of reports, the patient, and discussions with the patient's son Bethany Landry. Nursing home staff noted symptoms around 4:15 pm. Associated symptoms included left-sided facial droop with slurred speech. EMS was immediately called.   Note the patient had just been discharged from the hospital hospitalized at [facility] from 9/16 - 9/18 after having a fall suffering rib and clavicle fracture. Patient had positive orthostatic vital signs at that time, an elevated troponin thought secondary to chronic heart failure, and was possibly thought to have had an arrhythmia, but pacemaker interrogation was normal.  Patient notes that all week she has felt somewhat short of breath with intermittent wheezing. Otherwise she also complains of right shoulder pain with movement and has been using Tylenol to treat symptoms with moderate relief. She currently denies any significant weakness per se. At baseline she states that she gets around with a rolling walker. Patient's son, who was previously an anesthesiologist here at [facility], notes that they had previously backed off of her Lasix dosage during her last hospitalization in 9/16, but she had been having worsening shortness of breath. He advised that they increase her back to the 40 mg twice daily, which she has been taking over the last 1 week. He reports verifying this change was okay with Dr. Zoila Shutter the patient's cardiologist. Discussed with him regarding her recent lab work  which he note appears to be at her baseline. He also notes that after her last hospitalization she did not tolerate the right armsling and told staff to take it off. She followed up with orthopedics and they were okay with this.   ED Course: Patient was initially brought in as a code stroke. Vitals revealed elevated blood pressure of 178/65 but all other values within normal limits.  Evaluated by Dr. Roxy Manns of neurology. CT scan showing subacute small bilateral cerebellar infarcts that appeared to have been present on 9/16. Patient not a TPA candidate. Lab work reveals hemoglobin 11.7(baseline 9), sodium 126, chloride 90, BUN 20, creatinine 1.39, and troponin 0.16. UDS and urinalysis negative.  Review of Systems: As per HPI otherwise 10 point review of systems negative.   Past Medical History:  Diagnosis Date  . Benign hypertension   . CKD (chronic kidney disease), stage III    moderate  . Complete heart block (HCC)   . DOE (dyspnea on exertion)   . Fungal infection    recurrent  . Hyperlipidemia   . Hypothyroidism   . Insomnia   . Mastoiditis    hearing loss, s/p mastoiectomy, right ear  . Osteoarthritis   . Osteoporosis   . Sinoatrial node dysfunction (HCC)     Past Surgical History:  Procedure Laterality Date  . BACK SURGERY    . CATARACT EXTRACTION    . INSERT / REPLACE / REMOVE PACEMAKER  11/26/09   [employer]. Jude, DDD PM  . LAPAROTOMY     Ex lap for abdominal pain (ischemic colitis)  . MASTOIDECTOMY Right    Tumor removal and residual recurrent  fungal infection. Dr. Pollyann Kennedy  . THYROIDECTOMY       reports that she quit smoking about 33 years ago. Her smoking use included Cigarettes. She has never used smokeless tobacco. She reports that she drinks alcohol. She reports that she does not use drugs.  Allergies  Allergen Reactions  . Norvasc [Amlodipine Besylate] Swelling    Swelling in ankles  . Quinapril Hcl Swelling  . Ace Inhibitors Swelling and Cough    Family History    Problem Relation Age of Onset  . CAD Brother   . Cerebral aneurysm Brother   . Heart attack Mother     Prior to Admission medications   Medication Sig Start Date End Date Taking? Authorizing Provider  acetaminophen (TYLENOL) 500 MG tablet Take 500 mg by mouth 3 (three) times daily as needed (pain).    Yes Historical Provider, MD  aspirin 81 MG chewable tablet Chew 1 tablet (81 mg total) by mouth daily. 10/26/15  Yes Nishant Dhungel, MD  carvedilol (COREG) 3.125 MG tablet Take 1 tablet (3.125 mg total) by mouth 2 (two) times daily with a meal. 10/26/15  Yes Nishant Dhungel, MD  ENSURE PLUS (ENSURE PLUS) LIQD Take 237 mLs by mouth 2 (two) times daily as needed (for lack of appetite). Chocolate per MAR   Yes Historical Provider, MD  furosemide (LASIX) 40 MG tablet Take 1 tablet (40 mg total) by mouth daily. Patient taking differently: Take 40 mg by mouth 2 (two) times daily.  12/31/15  Yes Richarda Overlie, MD  ketorolac (TORADOL) 10 MG tablet Take 10 mg by mouth See admin instructions. Order date 01/07/16: take 1 tablet (10 mg) by mouth daily for 3 weeks (stop date 01/28/16)   Yes Historical Provider, MD  levothyroxine (SYNTHROID, LEVOTHROID) 125 MCG tablet Take 125 mcg by mouth daily before breakfast.   Yes Historical Provider, MD  loperamide (IMODIUM) 2 MG capsule Take 2-4 mg by mouth See admin instructions. Take 2 capsules (4 mg) by mouth after first loose stool (do not crush); take 1 capsule (2 mg) after each additional loose stool as needed (Do not exceed 4 capsules in 24 hours)   Yes Historical Provider, MD  neomycin-bacitracin-polymyxin (NEOSPORIN) OINT Apply 1 application topically See admin instructions. Clean right side of head with soap and water, pat to dry, apply triple antibiotic ointment once daily until healed, then stop.   Yes Historical Provider, MD  NON FORMULARY Take 163 mLs by mouth See admin instructions. Drink 5.5 oz (163 mls) prune juice daily. Hold when having stools.   Yes  Historical Provider, MD  senna-docusate (SENOKOT-S) 8.6-50 MG tablet Take 1 tablet by mouth at bedtime as needed for mild constipation. 12/31/15  Yes Richarda Overlie, MD  oxyCODONE (OXY IR/ROXICODONE) 5 MG immediate release tablet Take 0.5 tablets (2.5 mg total) by mouth every 6 (six) hours as needed for moderate pain. Patient not taking: Reported on 01/13/2016 12/31/15   Richarda Overlie, MD  traMADol (ULTRAM) 50 MG tablet Take 2 tablets (100 mg total) by mouth every 12 (twelve) hours as needed for moderate pain. Patient not taking: Reported on 01/13/2016 12/31/15   Richarda Overlie, MD    Physical Exam:  Constitutional: Frail elderly female in  NAD, calm, comfortable Vitals:   01/13/16 1817  BP: 178/65  Pulse: 77  Resp: 20  Temp: 98.7 F (37.1 C)  TempSrc: Oral  SpO2: 99%   Eyes: PERRL, lids and conjunctivae normal ENMT: Mucous membranes are dry. Posterior pharynx clear of any exudate or  lesions. Neck: normal, supple, no masses, no thyromegaly Respiratory:Decreased overall air movement, but appears clear with no wheezing, no crackles. Normal respiratory effort. No accessory muscle use.  Cardiovascular: Regular rate and rhythm, no murmurs / rubs / gallops. No extremity edema. 2+ pedal pulses. No carotid bruits.  Abdomen: no tenderness, no masses palpated. No hepatosplenomegaly. Bowel sounds positive.  Musculoskeletal: no clubbing / cyanosis. Joint deformity of the right shoulder with decreased ROM and TTP. Otherwise appears to have good ROM and other extremities, no contractures. Normal muscle tone.  Skin: Ecchymosis present of the right shoulder and upper back.  Neurologic: CN 2-12 grossly intact. Sensation intact, DTR normal. Strength 5/5 on right, 4+/5 on left. Dysarthric speech. Left-sided neglect. Psychiatric: Normal judgment and insight. Alert and oriented x 3. Normal mood.     Labs on Admission: I have personally reviewed following labs and imaging studies  CBC:  Recent Labs Lab  01/13/16 1745 01/13/16 1751  WBC 9.7  --   NEUTROABS 6.8  --   HGB 11.7* 12.6  HCT 36.3 37.0  MCV 91.0  --   PLT 379  --    Basic Metabolic Panel:  Recent Labs Lab 01/13/16 1745 01/13/16 1751  NA 126* 130*  K 4.4 4.7  CL 90* 91*  CO2 26  --   GLUCOSE 120* 116*  BUN 20 30*  CREATININE 1.39* 1.40*  CALCIUM 8.6*  --    GFR: Estimated Creatinine Clearance: 20.8 mL/min (by C-G formula based on SCr of 1.4 mg/dL (H)). Liver Function Tests:  Recent Labs Lab 01/13/16 1745  AST 20  ALT 13*  ALKPHOS 95  BILITOT 0.7  PROT 7.0  ALBUMIN 3.2*   No results for input(s): LIPASE, AMYLASE in the last 168 hours. No results for input(s): AMMONIA in the last 168 hours. Coagulation Profile:  Recent Labs Lab 01/13/16 1745  INR 1.08   Cardiac Enzymes: No results for input(s): CKTOTAL, CKMB, CKMBINDEX, TROPONINI in the last 168 hours. BNP (last 3 results) No results for input(s): PROBNP in the last 8760 hours. HbA1C: No results for input(s): HGBA1C in the last 72 hours. CBG: No results for input(s): GLUCAP in the last 168 hours. Lipid Profile: No results for input(s): CHOL, HDL, LDLCALC, TRIG, CHOLHDL, LDLDIRECT in the last 72 hours. Thyroid Function Tests: No results for input(s): TSH, T4TOTAL, FREET4, T3FREE, THYROIDAB in the last 72 hours. Anemia Panel: No results for input(s): VITAMINB12, FOLATE, FERRITIN, TIBC, IRON, RETICCTPCT in the last 72 hours. Urine analysis:    Component Value Date/Time   COLORURINE YELLOW 01/13/2016 1907   APPEARANCEUR HAZY (A) 01/13/2016 1907   LABSPEC <1.005 (L) 01/13/2016 1907   PHURINE 6.0 01/13/2016 1907   GLUCOSEU NEGATIVE 01/13/2016 1907   HGBUR NEGATIVE 01/13/2016 1907   BILIRUBINUR NEGATIVE 01/13/2016 1907   KETONESUR NEGATIVE 01/13/2016 1907   PROTEINUR NEGATIVE 01/13/2016 1907   UROBILINOGEN 0.2 10/12/2013 2057   NITRITE NEGATIVE 01/13/2016 1907   LEUKOCYTESUR SMALL (A) 01/13/2016 1907   Sepsis Labs: No results found for  this or any previous visit (from the past 240 hour(s)).   Radiological Exams on Admission: Dg Chest Portable 1 View  Result Date: 01/13/2016 CLINICAL DATA:  Left-sided weakness. EXAM: PORTABLE CHEST 1 VIEW COMPARISON:  Radiographs of December 30, 2015. FINDINGS: Stable cardiomediastinal silhouette. Atherosclerosis of thoracic aorta is noted. Left-sided pacemaker is unchanged in position. Status post posterior fixation of lower thoracic and lumbar spine. Combination of old and new right rib fractures are noted. Mild right apical and  basilar pneumothorax is noted. Left lung is clear. IMPRESSION: Aortic atherosclerosis. Stable right rib fractures are noted. Interval development of mild right apical and basilar pneumothorax. Critical Value/emergent results were called by telephone at the time of interpretation on 01/13/2016 at 7:39 pm to Dr. Alona BeneJOSHUA LONG , who verbally acknowledged these results. Electronically Signed   By: Lupita RaiderJames  Green Jr, M.D.   On: 01/13/2016 19:38   Ct Head Code Stroke Wo Contrast`  Addendum Date: 01/13/2016   ADDENDUM REPORT: 01/13/2016 18:46 ADDENDUM: Small bilateral cerebellar infarcts were present 12/29/2015, new when compared to 05/04/2015. Electronically Signed   By: Marnee SpringJonathon  Watts M.D.   On: 01/13/2016 18:46   Result Date: 01/13/2016 CLINICAL DATA:  Code stroke.  Left-sided weakness. EXAM: CT HEAD WITHOUT CONTRAST TECHNIQUE: Contiguous axial images were obtained from the base of the skull through the vertex without intravenous contrast. COMPARISON:  12/29/2015 FINDINGS: Brain: Interval moderate-sized infarct involving the left occipital lobe, nonhemorrhagic. Suspicious new density of a distal right M2 branch, 201:17 and 18. Small chronic posterior right frontal cortex infarct, best seen on sagittal reformats, stable. No acute hemorrhage, hydrocephalus, or mass. Vascular: Concern for right MCA branch thrombus, as above. Skull: No acute finding Sinuses/Orbits: Negative Other: Results  paged at the time of interpretation on 01/13/2016 at 6:01 pm to Dr. Roxy Mannsster, who verbally acknowledged these results. ASPECTS (Alberta Stroke Program Early CT Score) - Ganglionic level infarction (caudate, lentiform nuclei, internal capsule, insula, M1-M3 cortex): 7 - Supraganglionic infarction (M4-M6 cortex): 3 (when excluding small chronic infarct described above) Total score (0-10 with 10 being normal): 10 IMPRESSION: 1. Suspected high-density thrombus in distal right M2 branches. Small chronic cortical infarct in this distribution but no visible acute right hemispheric infarct. 2. Acute or subacute moderate size left occipital infarct, new from 12/29/2015. 3. ASPECTS is 10 on the right when accounting for chronic changes. 4. No acute hemorrhage. Electronically Signed: By: Marnee SpringJonathon  Watts M.D. On: 01/13/2016 18:08    EKG: Independently reviewed. Paced rhythm  Assessment/Plan Acute ischemic stroke with left hemineglect, left facial droop, and dysarthria:  - Admit to a telemetry bed - Neuro checks - Allow for permissive hypertension - check hemoglobin A1c and lipid panel - PT/OT/speech eval and treat - ASA - Advised starting statin - Gentle IV fluids NS at 75 ml/hr until patient passes swallow screen  - Appreciate neurology consultation, follow-up for further recommendations.  Right Pneumothorax - Continuous pulse oximetry with nasal cannula oxygen to keep O2 sats greater than 92% - Recheck  AP/Lat chest x-ray in a.m.  Elevated troponin: Similar to previous admission at 0.16 this appears to be the patient's baseline the result of likely CKD and CHF.  - Trend cardiac enzymes  Combined congestive heart failure/ status post PM for complete heart block: Last echo on 9/18 EF noted to be 25-30% Patient appears dry, but son notes that they want to try and keep her dry as she has significant soreness of breath otherwise. - Strict intake and output - Continue Coreg and furosemide tomorrow a.m. (will  need to readdress in a.m. with cardiology)  Chronic kidney disease stage III:  Patient's baseline creatinine ranges from 0.9 to 1.4 - Follow-up repeat BMP in a.m.  Essential hypertension - Allow for permissive hypertension at this time - Coreg and furosemide ordered for in a.m.  Fractured clavicle and shoulder: Occurred on 9/16. Patient did not tolerate sling. - Continue Tylenol prn  Hypothyroidism - Continue Levothyroxine  Hyponatremia: SAcute on chronic. odium 126 on admission.  Son states that baseline sodium is somewhere around 124. secondary to diuretic - Continue to monitor   Hypocalcemia: Ionized calcium 1.03 - 1 g Calcium gluconate IV x 1 dose now - Recheck ionized calcium in am and replace as needed  DVT prophylaxis: Lovenox Code Status: DNR Family Communication: Discussed care with the patient and son Bethany Landry. Disposition Plan: Discharge back to [facility] Consults called: Neurology Admission status: Observation telemetry  Clydie Braun MD [facility] Pager [phone]  If 7PM-7AM, please contact night-coverage [email or web address] Password [email or web address]  01/13/2016, 8:07 PM

## 2016-01-13 NOTE — Consult Note (Signed)
Neurology Consult Note  Reason for Consultation: CODE STROKE  Requesting provider: Gara Kroner, MD  CC: Patient has no complaint at this time  HPI: This is a Bethany Landry who is brought to the ED via EMS. She was at her assisted living facility and was walking in the hall when she had witnessed onset of left-sided weakness at 1715. EMS was activated and on their initial assessment noted dense left hemiparesis with slurred speech. Symptoms improved during transport to the hospital. I met her on arrival in the ED and she had symmetric movement of all extremities. She was taken for emergent [facility] which showed a subacute L occipital infarct and possible hyperdensity in a distal right M2 branch. NIHSS score was 3 due to L neglect, mild L facial droop, and mild dysarthria.   Last known well: 1715 NIHSS score: 3 tPA given?: No, subacute stroke in past two weeks  PMH:  Past Medical History:  Diagnosis Date  . Benign hypertension   . CKD (chronic kidney disease), stage III    moderate  . Complete heart block ([place])   . DOE (dyspnea on exertion)   . Fungal infection    recurrent  . Hyperlipidemia   . Hypothyroidism   . Insomnia   . Mastoiditis    hearing loss, s/p mastoiectomy, right ear  . Osteoarthritis   . Osteoporosis   . Sinoatrial node dysfunction (HCC)     PSH:  Past Surgical History:  Procedure Laterality Date  . BACK SURGERY    . CATARACT EXTRACTION    . INSERT / REPLACE / REMOVE PACEMAKER  11/26/09   St. Jude, DDD PM  . LAPAROTOMY     Ex lap for abdominal pain (ischemic colitis)  . MASTOIDECTOMY Right    Tumor removal and residual recurrent fungal infection. Dr. Constance Holster  . THYROIDECTOMY      Family history: Family History  Problem Relation Age of Onset  . CAD Brother   . Cerebral aneurysm Brother   . Heart attack Mother     Social history:  Social History   Social History  . Marital status: Divorced    Spouse name: N/A  . Number of children: N/A  . Years of  education: N/A   Occupational History  . Not on file.   Social History Main Topics  . Smoking status: Former Smoker    Types: Cigarettes    Quit date: 04/14/1982  . Smokeless tobacco: Never Used  . Alcohol use Yes     Comment: wine, occasionally  . Drug use: No  . Sexual activity: Not on file   Other Topics Concern  . Not on file   Social History Narrative  . No narrative on file    Current outpatient meds: No outpatient prescriptions have been marked as taking for the 01/13/16 encounter [facility] Encounter).    Current inpatient meds:  No current facility-administered medications for this encounter.    Current Outpatient Prescriptions  Medication Sig Dispense Refill  . acetaminophen (TYLENOL) 500 MG tablet Take 500 mg by mouth 2 (two) times daily as needed for mild pain or moderate pain.    Marland Kitchen aspirin 81 MG chewable tablet Chew 1 tablet (81 mg total) by mouth daily. 30 tablet 0  . carvedilol (COREG) 3.125 MG tablet Take 1 tablet (3.125 mg total) by mouth 2 (two) times daily with a meal. 60 tablet 0  . ENSURE PLUS (ENSURE PLUS) LIQD Take 237 mLs by mouth 2 (two) times daily as needed (  for lack of appetite).    . furosemide (LASIX) 40 MG tablet Take 1 tablet (40 mg total) by mouth daily. 30 tablet 0  . levothyroxine (SYNTHROID, LEVOTHROID) 125 MCG tablet Take 1 TABLET (125 mcg) BY MOUTH ONCE DAILY  6  . NON FORMULARY Take 240 mLs by mouth as needed (for constipation). Prune Juice     . oxyCODONE (OXY IR/ROXICODONE) 5 MG immediate release tablet Take 0.5 tablets (2.5 mg total) by mouth every 6 (six) hours as needed for moderate pain. 30 tablet 0  . senna-docusate (SENOKOT-S) 8.6-50 MG tablet Take 1 tablet by mouth at bedtime as needed for mild constipation. 60 tablet 1  . traMADol (ULTRAM) 50 MG tablet Take 2 tablets (100 mg total) by mouth every 12 (twelve) hours as needed for moderate pain. 30 tablet 1    Allergies: Allergies  Allergen Reactions  . Norvasc [Amlodipine  Besylate] Swelling    Swelling in ankles  . Quinapril Hcl Swelling  . Ace Inhibitors Swelling and Cough    ROS: As per HPI. A full 14-point review of systems was performed and is otherwise notable for a fall yesterday--the patient is unable to give me specifics about this event. She reports nausea over the past few days and vomited yesterday. She also states that she has chronic shortness of breath, no change today.   PE:  There were no vitals taken for this visit.  General: WD elderly Caucasian Landry lying in bed. She tends to look to her left and appears to have a mild left neglect but will turn to her left when I call her from that side. She is AAO x4. She has mild dysarthria, no aphasia. She is mildly perseverative. Follows commands briskly. Affect is bright with congruent mood. Comportment is normal.  HEENT: Normocephalic. Neck supple without LAD. MMM, OP clear. She has dried food around the left side of her mouth. Dentition good. Sclerae anicteric. No conjunctival injection.  CV: Regular, no murmur. Carotid pulses full and symmetric, no bruits. Distal pulses 2+ and symmetric.  Lungs: CTAB on anterior exam.  Abdomen: Soft, non-distended, non-tender. Bowel sounds present x4.  Extremities: No C/C/E. Neuro:  CN: Pupils are equal and round. They are symmetrically reactive from 3-->2 mm. Visual fields are full to confrontation but she has mild left neglect. EOMI with breakup of smooth pursuits, no nystagmus. No reported diplopia. Facial sensation is intact to light touch. She has a mild left central VII pattern of weakness with droop of the left mouth. Hearing is intact to conversational voice. Palate elevates symmetrically and uvula is midline. Voice is normal in tone, pitch and quality. Bilateral SCM and trapezii are 5/5. Tongue is midline with normal bulk and mobility.  Motor: Normal bulk, tone, and strength. Movement are a bit slower and more deliberate on the left side. No tremor or other  abnormal movements. No drift.  Sensation: Intact to light touch and pin on the R. She is neglecting the left side but responds to noxious stimuli. DTRs: 2+, symmetric. Toes downgoing bilaterally. No pathologic reflexes.  Coordination: Finger-to-nose and heel-to-shin are without dysmetria.   Labs:  Lab Results  Component Value Date   WBC 9.7 01/13/2016   HGB 12.6 01/13/2016   HCT 37.0 01/13/2016   PLT 379 01/13/2016   GLUCOSE 116 (H) 01/13/2016   ALT 12 (L) 12/31/2015   AST 21 12/31/2015   NA 130 (L) 01/13/2016   K 4.7 01/13/2016   CL 91 (L) 01/13/2016   CREATININE  1.40 (H) 01/13/2016   BUN 30 (H) 01/13/2016   CO2 27 12/31/2015   TSH 0.053 (L) 12/30/2015   Troponin-I 0.16  Imaging:  I have personally and independently reviewed the [facility] without contrast from today. This shows an area of hypodensity in the L occipital region that is consistent with a subacute ischemic stroke, new when compared to her most recent [facility] on 12/29/15. She has possible hyperdensity in the distal M2 branch on the R. Severe chronic small vessel ischemic disease is present in the bihemispheric white matter. There is hydrocephalus ex-vacuo. This was discussed with the interpreting neuroradiologist at the time of the consult.   Other diagnostic studies:  TTE 12/31/15: moderate concentric LVH; EF 25-30%; severe diffuse hypokinesis with akinesis of the apical myocardium. Grade 2 diastolic dysfunction. Mild mitral regurgitation.   Assessment and Plan:  1. Acute Ischemic Stroke: This is an acute stroke involving the R MCA territory. It is most likely embolic in etiology. In addition, she has had a subacute L parieto-occipital stroke since 12/29/15. Known risk factors for cerebrovascular disease in this patient include HTN, hyperlipidemia, age, recent stroke. She has a pacer so cannot have MRI or MRA. She just had a TTE on 12/31/15 that showed apical akinesis and EF 25-30%, both of which make cardiac embolic source likely.  Check carotid Dopplers. Will check fasting lipids and hemoglobin a1c. Recommend antiplatelet therapy with aspirin 81 mg daily for secondary stroke prevention once cleared to take oral medications. Consider statin. Ensure adequate glucose control. Allow permissive hypertension in the acute phase, treating only SBP greater than 220 mmHg and/or DBP greater than 110 mmHg. Avoid fever and hyperglycemia as these can extend the infarct. Avoid hypotonic IVF to minimize exacerbation of post-stroke edema. Initiate rehab services. DVT prophylaxis as needed.   2. Left hemineglect: This is mild and acute, due to stroke. PT/OT/rehab.   3. Left facial droop: This is mild and acute, due to stroke. Follow.   4. Dysarthria: This is mild and acute, due to stroke. Needs to have swallow eval and should remain NPO until cleared. ST.

## 2016-01-13 NOTE — ED Provider Notes (Signed)
Emergency Department Provider Note   I have reviewed the triage vital signs and the nursing notes.   HISTORY  Chief Complaint Code Stroke   HPI Bethany Landry is a 82 y.o. female with PMH of subacute stroke, recent fall, HTN, CKD, and CHF presents to the emergency department for evaluation of acute onset stroke symptoms. The patient was at her assisted living facility and was noted to have acute onset left side weakness at 17:15. Patient was noted to have speech disturbance and left hemiparesis. Patient denies pain. She reports fall yesterday with continued right shoulder and chest wall pain.   HPI and ROS limited by baseline mild dementia.   Past Medical History:  Diagnosis Date  . Benign hypertension   . CKD (chronic kidney disease), stage III    moderate  . Complete heart block (HCC)   . DOE (dyspnea on exertion)   . Fungal infection    recurrent  . Hyperlipidemia   . Hypothyroidism   . Insomnia   . Mastoiditis    hearing loss, s/p mastoiectomy, right ear  . Osteoarthritis   . Osteoporosis   . Sinoatrial node dysfunction [facility])     Patient Active Problem List   Diagnosis Date Noted  . CVA (cerebral vascular accident) (HCC) 01/13/2016  . Acute ischemic stroke (HCC)   . Hemineglect   . Facial weakness   . Dysarthria   . Multiple fractures of ribs, right side, initial encounter for closed fracture 12/29/2015  . Closed right clavicular fracture 12/29/2015  . Medication management 11/29/2015  . Hypothyroidism 11/12/2015  . Dysphagia 11/12/2015  . Chronic systolic CHF (congestive heart failure) (HCC) 10/26/2015  . Protein-calorie malnutrition, severe 10/24/2015  . Syncope 05/04/2015  . Elevated systolic blood pressure 05/04/2015  . CKD (chronic kidney disease), stage III 05/04/2015  . Chronic hyponatremia 05/04/2015  . Occipital scalp laceration 05/04/2015  . Interstitial lung disease (HCC) 06/14/2013  . Cardiac pacemaker 06/14/2013  . Complete heart block  (HCC) 05/05/2013  . Essential hypertension 05/05/2013    Past Surgical History:  Procedure Laterality Date  . BACK SURGERY    . CATARACT EXTRACTION    . INSERT / REPLACE / REMOVE PACEMAKER  11/26/09   [employer], DDD PM  . LAPAROTOMY     Ex lap for abdominal pain (ischemic colitis)  . MASTOIDECTOMY Right    Tumor removal and residual recurrent fungal infection. Dr. Pollyann Kennedy  . THYROIDECTOMY        Allergies Norvasc [amlodipine besylate]; Quinapril hcl; and Ace inhibitors  Family History  Problem Relation Age of Onset  . CAD Brother   . Cerebral aneurysm Brother   . Heart attack Mother     Social History Social History  Substance Use Topics  . Smoking status: Former Smoker    Types: Cigarettes    Quit date: 04/14/1982  . Smokeless tobacco: Never Used  . Alcohol use Yes     Comment: wine, occasionally    Review of Systems  Limited by baseline mild dementia.   ____________________________________________   PHYSICAL EXAM:  VITAL SIGNS: ED Triage Vitals  Enc Vitals Group     BP 01/13/16 1817 178/65     Pulse Rate 01/13/16 1817 77     Resp 01/13/16 1817 20     Temp 01/13/16 1817 98.7 F (37.1 C)     Temp Source 01/13/16 1817 Oral     SpO2 01/13/16 1817 99 %     Pain Score 01/13/16 1823 5  Constitutional: Alert and oriented. Obvious left neglect.  Eyes: Conjunctivae are normal. PERRL.  Head: Atraumatic. Nose: No congestion/rhinnorhea. Mouth/Throat: Mucous membranes are moist.  Oropharynx non-erythematous. Neck: No stridor.   Cardiovascular: Normal rate, regular rhythm. Good peripheral circulation. Grossly normal heart sounds.   Respiratory: Normal respiratory effort.  No retractions. Lungs CTAB. Gastrointestinal: Soft and nontender. No distention.  Musculoskeletal: No lower extremity tenderness nor edema. No gross deformities of extremities. Neurologic:  Slurred speech with left hemi-neglect and left face droop. NIHSS 3.  Skin:  Skin is warm, dry and  intact. No rash noted.   ____________________________________________   LABS (all labs ordered are listed, but only abnormal results are displayed)  Labs Reviewed  APTT - Abnormal; Notable for the following:       Result Value   aPTT 23 (*)    All other components within normal limits  CBC - Abnormal; Notable for the following:    Hemoglobin 11.7 (*)    All other components within normal limits  DIFFERENTIAL - Abnormal; Notable for the following:    Eosinophils Absolute 1.1 (*)    All other components within normal limits  COMPREHENSIVE METABOLIC PANEL - Abnormal; Notable for the following:    Sodium 126 (*)    Chloride 90 (*)    Glucose, Bld 120 (*)    Creatinine, Ser 1.39 (*)    Calcium 8.6 (*)    Albumin 3.2 (*)    ALT 13 (*)    GFR calc non Af Amer 32 (*)    GFR calc Af Amer 37 (*)    All other components within normal limits  URINALYSIS, ROUTINE W REFLEX MICROSCOPIC (NOT AT [facility]) - Abnormal; Notable for the following:    APPearance HAZY (*)    Specific Gravity, Urine <1.005 (*)    Leukocytes, UA SMALL (*)    All other components within normal limits  URINE MICROSCOPIC-ADD ON - Abnormal; Notable for the following:    Squamous Epithelial / LPF 0-5 (*)    Bacteria, UA RARE (*)    All other components within normal limits  TROPONIN I - Abnormal; Notable for the following:    Troponin I 0.17 (*)    All other components within normal limits  TROPONIN I - Abnormal; Notable for the following:    Troponin I 0.15 (*)    All other components within normal limits  CBC - Abnormal; Notable for the following:    WBC 11.4 (*)    Hemoglobin 11.8 (*)    All other components within normal limits  BASIC METABOLIC PANEL - Abnormal; Notable for the following:    Sodium 134 (*)    Chloride 97 (*)    Glucose, Bld 124 (*)    Creatinine, Ser 1.25 (*)    GFR calc non Af Amer 36 (*)    GFR calc Af Amer 42 (*)    All other components within normal limits  I-STAT CHEM 8, ED -  Abnormal; Notable for the following:    Sodium 130 (*)    Chloride 91 (*)    BUN 30 (*)    Creatinine, Ser 1.40 (*)    Glucose, Bld 116 (*)    Calcium, Ion 1.03 (*)    All other components within normal limits  I-STAT TROPOININ, ED - Abnormal; Notable for the following:    Troponin i, poc 0.16 (*)    All other components within normal limits  MRSA PCR SCREENING  ETHANOL  PROTIME-INR  URINE RAPID  DRUG SCREEN, HOSP PERFORMED  LIPID PANEL  HEMOGLOBIN A1C  TROPONIN I  CALCIUM, IONIZED   ____________________________________________  EKG   EKG Interpretation  Date/Time:  Sunday January 13 2016 18:16:45 EDT Ventricular Rate:  76 PR Interval:    QRS Duration: 165 QT Interval:  436 QTC Calculation: 491 R Axis:   -82 Text Interpretation:  A-V dual-paced rhythm with some inhibition No further analysis attempted due to paced rhythm Similar to prior tracing.  Confirmed by LONG MD, JOSHUA [id number]) on 01/13/2016 6:35:49 PM Also confirmed by LONG MD, JOSHUA [phone]), editor [place]  CCT, BEVERLY ([id number])  on 01/14/2016 7:02:50 AM       ____________________________________________  RADIOLOGY  Ct Angio Head W Or Wo Contrast  Result Date: 01/14/2016 CLINICAL DATA:  82 year old female with left side weakness. Possible hyperdense right MCA branches on noncontrast head CT yesterday. Initial encounter. EXAM: CT ANGIOGRAPHY HEAD AND NECK TECHNIQUE: Multidetector CT imaging of the head and neck was performed using the standard protocol during bolus administration of intravenous contrast. Multiplanar CT image reconstructions and MIPs were obtained to evaluate the vascular anatomy. Carotid stenosis measurements (when applicable) are obtained utilizing NASCET criteria, using the distal internal carotid diameter as the denominator. CONTRAST:  50 mL Isovue 370 COMPARISON:  Noncontrast head CT 01/13/2016 and earlier. Cervical spine CT 12/29/2015. FINDINGS: CTA NECK Skeleton: Stable cervical spine from the  recent noncontrast CT. Osteopenia. Mild T2 superior endplate deformity appears stable. Displaced fractures of the right lateral second through fifth ribs. Age indeterminate fracture of the right posterior fourth rib. A posterior fifth rib fracture appears to be either acute or subacute. There is a chronic appearing right posterior lateral sixth rib fracture. Other visualized osseous structures appear intact. Visualized paranasal sinuses and mastoids are stable and well pneumatized. Upper chest: Positive for right lung hydro pneumothorax. Small to moderate component of right lung pleural air (series 8, image 185). Small to moderate component of layering intermediate density fluid which probably is blood. Superimposed centrilobular emphysema. Left chest cardiac pacemaker. No superior mediastinal lymphadenopathy. Small volume retained secretions in the trachea. Other neck: Diminutive thyroid. Negative larynx, pharynx, parapharyngeal spaces, retropharyngeal space, sublingual space, submandibular glands and parotid glands. No cervical lymphadenopathy. Aortic arch: 3 vessel arch configuration. Calcified arch atherosclerosis, but capacious arch and great vessel origins. Right carotid system: Widely patent right cervical carotid. Mild to moderate calcified plaque at the right carotid bifurcation without stenosis. Left carotid system: Widely patent left cervical carotid. Mild to moderate calcified plaque at the left ICA origin and bulb without stenosis. Vertebral arteries: No proximal right subclavian artery stenosis with mild calcified plaque. Normal right vertebral artery origin. Tortuous right V2 segment but no right vertebral artery stenosis to the skullbase. No proximal left subclavian artery stenosis despite calcified plaque. Normal left vertebral artery origin. Tortuous left V2 segment. No left vertebral artery stenosis to the skullbase. CTA HEAD Posterior circulation: Patent PICA origins. There is mild irregularity  and stenosis of both distal vertebral arteries just distal to the PICA origins. There is mild calcified plaque in the left V4 segment which itself does not result in stenosis. Patent vertebrobasilar junction. Widely patent basilar artery without stenosis. SCA and PCA origins are normal. Posterior communicating arteries are diminutive or absent. Mild left PCA irregularity. No significant PCA stenosis. Anterior circulation: Both ICA siphons are patent. There is calcified plaque affecting both siphons, up to moderate in slightly greater on the right, but no significant stenosis. Both carotid termini are patent. Both MCA and  ACA origins are normal. The right A1 is somewhat dominant. The anterior communicating artery and bilateral ACA branches are normal. Left MCA M1 segment, bifurcation, and left MCA branches are within normal limits. Right MCA M1 segment and bifurcation are patent and appear normal. No right MCA M2 branch occlusion is identified. In the middle sylvian M2 branch there is mild irregularity and stenosis demonstrated on series 605, image 6. There is preserved distal flow into downstream M3 branches. However, there is a paucity of middle sylvian M3 branches compared to the left side best seen on series 606, image 60. Venous sinuses: Patent. Anatomic variants: Dominant right A1 segment. Delayed phase: Cytotoxic edema has developed at the right operculum best seen on series 8 images 18 through 20. No associated hemorrhage or mass effect. No ventriculomegaly. Gray-white matter differentiation elsewhere appears stable. No abnormal enhancement identified. Review of the MIP images confirms the above findings IMPRESSION: 1. Positive for Right Hemo-pneumothorax, small to moderate. Multiple displaced right rib fractures; at least ribs two through five. And the fourth and fifth ribs are fractured in two places. 2. Negative for emergent large vessel occlusion. Generalized ectatic arteries in the head and neck with no  significant large vessel stenosis despite calcified atherosclerosis. 3. There is Right MCA M3 branch Occlusion suspected. And there is an Evolving Right MCA Infarct at the operculum. No associated hemorrhage or mass effect. 4. Otherwise stable CT appearance of the brain. 5. Emphysema. 6. Preliminary report of the above was discussed by telephone with Dr. Marvel Plan on 01/14/2016 at 1157 hours. Electronically Signed   By: Odessa Fleming M.D.   On: 01/14/2016 12:21   Dg Chest 2 View  Result Date: 01/14/2016 CLINICAL DATA:  Right pneumothorax, CHF, chronic renal insufficiency, right clavicular and right rib fractures, acute CVA. EXAM: CHEST  2 VIEW COMPARISON:  Portable chest x-ray of January 13, 2016 FINDINGS: The right-sided pneumothorax is not clearly evident today. Multiple right posterior and lateral rib fractures as well as distal right clavicular fractures are noted. A trace of pleural fluid is likely present on the right and small effusion on the left as well. Retrocardiac atelectasis on the left is stable. The heart is mildly enlarged. The pulmonary vascularity is not engorged. The permanent pacemaker is in stable position. There is calcification in the wall of the thoracic aorta. IMPRESSION: No definite right-sided pneumothorax visible today. Small bilateral pleural effusions. Known right clavicular and right rib fractures. Left basilar atelectasis or pneumonia. No CHF. Aortic atherosclerosis. Electronically Signed   By: David  [place] M.D.   On: 01/14/2016 07:24   Ct Angio Neck W Or Wo Contrast  Result Date: 01/14/2016 CLINICAL DATA:  82 year old female with left side weakness. Possible hyperdense right MCA branches on noncontrast head CT yesterday. Initial encounter. EXAM: CT ANGIOGRAPHY HEAD AND NECK TECHNIQUE: Multidetector CT imaging of the head and neck was performed using the standard protocol during bolus administration of intravenous contrast. Multiplanar CT image reconstructions and MIPs were obtained  to evaluate the vascular anatomy. Carotid stenosis measurements (when applicable) are obtained utilizing NASCET criteria, using the distal internal carotid diameter as the denominator. CONTRAST:  50 mL Isovue 370 COMPARISON:  Noncontrast head CT 01/13/2016 and earlier. Cervical spine CT 12/29/2015. FINDINGS: CTA NECK Skeleton: Stable cervical spine from the recent noncontrast CT. Osteopenia. Mild T2 superior endplate deformity appears stable. Displaced fractures of the right lateral second through fifth ribs. Age indeterminate fracture of the right posterior fourth rib. A posterior fifth rib fracture appears to  be either acute or subacute. There is a chronic appearing right posterior lateral sixth rib fracture. Other visualized osseous structures appear intact. Visualized paranasal sinuses and mastoids are stable and well pneumatized. Upper chest: Positive for right lung hydro pneumothorax. Small to moderate component of right lung pleural air (series 8, image 185). Small to moderate component of layering intermediate density fluid which probably is blood. Superimposed centrilobular emphysema. Left chest cardiac pacemaker. No superior mediastinal lymphadenopathy. Small volume retained secretions in the trachea. Other neck: Diminutive thyroid. Negative larynx, pharynx, parapharyngeal spaces, retropharyngeal space, sublingual space, submandibular glands and parotid glands. No cervical lymphadenopathy. Aortic arch: 3 vessel arch configuration. Calcified arch atherosclerosis, but capacious arch and great vessel origins. Right carotid system: Widely patent right cervical carotid. Mild to moderate calcified plaque at the right carotid bifurcation without stenosis. Left carotid system: Widely patent left cervical carotid. Mild to moderate calcified plaque at the left ICA origin and bulb without stenosis. Vertebral arteries: No proximal right subclavian artery stenosis with mild calcified plaque. Normal right vertebral  artery origin. Tortuous right V2 segment but no right vertebral artery stenosis to the skullbase. No proximal left subclavian artery stenosis despite calcified plaque. Normal left vertebral artery origin. Tortuous left V2 segment. No left vertebral artery stenosis to the skullbase. CTA HEAD Posterior circulation: Patent PICA origins. There is mild irregularity and stenosis of both distal vertebral arteries just distal to the PICA origins. There is mild calcified plaque in the left V4 segment which itself does not result in stenosis. Patent vertebrobasilar junction. Widely patent basilar artery without stenosis. SCA and PCA origins are normal. Posterior communicating arteries are diminutive or absent. Mild left PCA irregularity. No significant PCA stenosis. Anterior circulation: Both ICA siphons are patent. There is calcified plaque affecting both siphons, up to moderate in slightly greater on the right, but no significant stenosis. Both carotid termini are patent. Both MCA and ACA origins are normal. The right A1 is somewhat dominant. The anterior communicating artery and bilateral ACA branches are normal. Left MCA M1 segment, bifurcation, and left MCA branches are within normal limits. Right MCA M1 segment and bifurcation are patent and appear normal. No right MCA M2 branch occlusion is identified. In the middle sylvian M2 branch there is mild irregularity and stenosis demonstrated on series 605, image 6. There is preserved distal flow into downstream M3 branches. However, there is a paucity of middle sylvian M3 branches compared to the left side best seen on series 606, image 60. Venous sinuses: Patent. Anatomic variants: Dominant right A1 segment. Delayed phase: Cytotoxic edema has developed at the right operculum best seen on series 8 images 18 through 20. No associated hemorrhage or mass effect. No ventriculomegaly. Gray-white matter differentiation elsewhere appears stable. No abnormal enhancement identified.  Review of the MIP images confirms the above findings IMPRESSION: 1. Positive for Right Hemo-pneumothorax, small to moderate. Multiple displaced right rib fractures; at least ribs two through five. And the fourth and fifth ribs are fractured in two places. 2. Negative for emergent large vessel occlusion. Generalized ectatic arteries in the head and neck with no significant large vessel stenosis despite calcified atherosclerosis. 3. There is Right MCA M3 branch Occlusion suspected. And there is an Evolving Right MCA Infarct at the operculum. No associated hemorrhage or mass effect. 4. Otherwise stable CT appearance of the brain. 5. Emphysema. 6. Preliminary report of the above was discussed by telephone with Dr. Marvel Plan on 01/14/2016 at 1157 hours. Electronically Signed   By: Rexene Edison  Margo Aye M.D.   On: 01/14/2016 12:21   Dg Chest Portable 1 View  Result Date: 01/13/2016 CLINICAL DATA:  Left-sided weakness. EXAM: PORTABLE CHEST 1 VIEW COMPARISON:  Radiographs of December 30, 2015. FINDINGS: Stable cardiomediastinal silhouette. Atherosclerosis of thoracic aorta is noted. Left-sided pacemaker is unchanged in position. Status post posterior fixation of lower thoracic and lumbar spine. Combination of old and new right rib fractures are noted. Mild right apical and basilar pneumothorax is noted. Left lung is clear. IMPRESSION: Aortic atherosclerosis. Stable right rib fractures are noted. Interval development of mild right apical and basilar pneumothorax. Critical Value/emergent results were called by telephone at the time of interpretation on 01/13/2016 at 7:39 pm to Dr. Alona Bene , who verbally acknowledged these results. Electronically Signed   By: Lupita Raider, M.D.   On: 01/13/2016 19:38   Ct Head Code Stroke Wo Contrast`  Addendum Date: 01/13/2016   ADDENDUM REPORT: 01/13/2016 18:46 ADDENDUM: Small bilateral cerebellar infarcts were present 12/29/2015, new when compared to 05/04/2015. Electronically Signed   By:  Marnee Spring M.D.   On: 01/13/2016 18:46   Result Date: 01/13/2016 CLINICAL DATA:  Code stroke.  Left-sided weakness. EXAM: CT HEAD WITHOUT CONTRAST TECHNIQUE: Contiguous axial images were obtained from the base of the skull through the vertex without intravenous contrast. COMPARISON:  12/29/2015 FINDINGS: Brain: Interval moderate-sized infarct involving the left occipital lobe, nonhemorrhagic. Suspicious new density of a distal right M2 branch, 201:17 and 18. Small chronic posterior right frontal cortex infarct, best seen on sagittal reformats, stable. No acute hemorrhage, hydrocephalus, or mass. Vascular: Concern for right MCA branch thrombus, as above. Skull: No acute finding Sinuses/Orbits: Negative Other: Results paged at the time of interpretation on 01/13/2016 at 6:01 pm to Dr. Roxy Manns, who verbally acknowledged these results. ASPECTS ([place]ber[place] Stroke Program Early CT Score) - Ganglionic level infarction (caudate, lentiform nuclei, internal capsule, insula, M1-M3 cortex): 7 - Supraganglionic infarction (M4-M6 cortex): 3 (when excluding small chronic infarct described above) Total score (0-10 with 10 being normal): 10 IMPRESSION: 1. Suspected high-density thrombus in distal right M2 branches. Small chronic cortical infarct in this distribution but no visible acute right hemispheric infarct. 2. Acute or subacute moderate size left occipital infarct, new from 12/29/2015. 3. ASPECTS is 10 on the right when accounting for chronic changes. 4. No acute hemorrhage. Electronically Signed: By: Marnee Spring M.D. On: 01/13/2016 18:08    ____________________________________________   PROCEDURES  Procedure(s) performed:   Procedures  CRITICAL CARE Performed by: Maia Plan Total critical care time: 35 minutes Critical care time was exclusive of separately billable procedures and treating other patients. Critical care was necessary to treat or prevent imminent or life-threatening  deterioration. Critical care was time spent personally by me on the following activities: development of treatment plan with patient and/or surrogate as well as nursing, discussions with consultants, evaluation of patient's response to treatment, examination of patient, obtaining history from patient or surrogate, ordering and performing treatments and interventions, ordering and review of laboratory studies, ordering and review of radiographic studies, pulse oximetry and re-evaluation of patient's condition.  Alona Bene, MD Emergency Medicine  ____________________________________________   INITIAL IMPRESSION / ASSESSMENT AND PLAN / ED COURSE  Pertinent labs & imaging results that were available during my care of the patient were reviewed by me and considered in my medical decision making (see chart for details).  Patient presents to the emergency department as a code stroke with left hemi neglect and mild droop. Airway is clear on  arrival. Neurology at bedside for evaluation. Patient taken emergently to CT scanner.   06:30 PM Spoke with Dr. Roxy Manns with Neurology. The patient is not a tPA candidate because of her subacute stroke. Patient also with elevated troponin (similarly elevated from 09/16), AKI, and hyponatremia. Vital signs are within normal limits and patient remains awake and alert with continued hemineglect.   IVF for hyponatremia and AKI. Discussed with internal medicine team regarding admission. Patient also with small PNX from recetn rib fx. Will defer chest tube placement and opt for O2 therapy given size.   Reviewed CXR, labs, other imaging, and EKG. Updated patient regarding the plan prior to admission.    ____________________________________________  FINAL CLINICAL IMPRESSION(S) / ED DIAGNOSES  Final diagnoses:  Left-sided weakness  Pneumothorax  Stroke (HCC)     MEDICATIONS GIVEN DURING THIS VISIT:  Medications  loperamide (IMODIUM) capsule 2-4 mg (not  administered)  neomycin-bacitracin-polymyxin (NEOSPORIN) ointment 1 application (not administered)  senna-docusate (Senokot-S) tablet 1 tablet (not administered)  furosemide (LASIX) tablet 40 mg (40 mg Oral Not Given 01/14/16 0800)  feeding supplement (ENSURE ENLIVE) (ENSURE ENLIVE) liquid 237 mL (not administered)  acetaminophen (TYLENOL) tablet 500 mg (not administered)  carvedilol (COREG) tablet 3.125 mg (3.125 mg Oral Not Given 01/14/16 0800)   stroke: mapping our early stages of recovery book (not administered)  levothyroxine (SYNTHROID, LEVOTHROID) tablet 125 mcg (125 mcg Oral Not Given 01/14/16 0730)  RESOURCE THICKENUP CLEAR (not administered)  aspirin EC tablet 325 mg (325 mg Oral Not Given 01/14/16 1100)  atorvastatin (LIPITOR) tablet 10 mg (not administered)  aspirin suppository 300 mg (not administered)  sodium chloride 0.9 % bolus 500 mL (0 mLs Intravenous Stopped 01/13/16 2054)  calcium gluconate 1 g in sodium chloride 0.9 % 100 mL IVPB (1 g Intravenous Given 01/14/16 0016)  iopamidol (ISOVUE-370) 76 % injection (50 mLs Intravenous Contrast Given 01/14/16 1140)     Note:  This document was prepared using Dragon voice recognition software and may include unintentional dictation errors.  Alona Bene, MD Emergency Medicine   Maia Plan, MD 01/14/16 [phone]

## 2016-01-13 NOTE — ED Notes (Signed)
Pt noted to have yellow bruising to right breast and right shoulder. Pt reports she has a shoulder fracture from a fall. Pt reports yesterday she had a new fall and hit right front of head. PT noted to have left sided facial droop with left sided weakness and partial left sided neglect. Pt did not have a left sided drift to either extremities. Pt is alert and oriented to person place and time. Pt states she also has vomited several times since this fall.

## 2016-01-13 NOTE — ED Triage Notes (Signed)
EMS reports- LSN 1730 (arrived at [id number]). [facility] from [facility]. Staff noted "sudden onset" of left sided weakness. Left sided deficits noted. PT has out of facility DNR. PIV 20G to L hand and R hand.

## 2016-01-13 NOTE — ED Notes (Signed)
Per facility pt had sudden onset L sided weakness 1730, NIHSS 3. Pt failed swallow screen. Pt fell yesterday and has significant bruising to entire R side of body.

## 2016-01-14 ENCOUNTER — Observation Stay (HOSPITAL_COMMUNITY): Payer: Medicare Other

## 2016-01-14 ENCOUNTER — Ambulatory Visit: Payer: Medicare Other | Admitting: Nurse Practitioner

## 2016-01-14 DIAGNOSIS — N183 Chronic kidney disease, stage 3 (moderate): Secondary | ICD-10-CM | POA: Diagnosis not present

## 2016-01-14 DIAGNOSIS — I639 Cerebral infarction, unspecified: Secondary | ICD-10-CM

## 2016-01-14 DIAGNOSIS — I130 Hypertensive heart and chronic kidney disease with heart failure and stage 1 through stage 4 chronic kidney disease, or unspecified chronic kidney disease: Secondary | ICD-10-CM | POA: Diagnosis present

## 2016-01-14 DIAGNOSIS — R748 Abnormal levels of other serum enzymes: Secondary | ICD-10-CM | POA: Diagnosis present

## 2016-01-14 DIAGNOSIS — I5022 Chronic systolic (congestive) heart failure: Secondary | ICD-10-CM | POA: Diagnosis present

## 2016-01-14 DIAGNOSIS — W19XXXA Unspecified fall, initial encounter: Secondary | ICD-10-CM | POA: Diagnosis present

## 2016-01-14 DIAGNOSIS — Z888 Allergy status to other drugs, medicaments and biological substances status: Secondary | ICD-10-CM | POA: Diagnosis not present

## 2016-01-14 DIAGNOSIS — Z87891 Personal history of nicotine dependence: Secondary | ICD-10-CM | POA: Diagnosis not present

## 2016-01-14 DIAGNOSIS — R29810 Facial weakness: Secondary | ICD-10-CM | POA: Diagnosis present

## 2016-01-14 DIAGNOSIS — Z9889 Other specified postprocedural states: Secondary | ICD-10-CM | POA: Diagnosis not present

## 2016-01-14 DIAGNOSIS — G8194 Hemiplegia, unspecified affecting left nondominant side: Secondary | ICD-10-CM | POA: Diagnosis present

## 2016-01-14 DIAGNOSIS — F0390 Unspecified dementia without behavioral disturbance: Secondary | ICD-10-CM | POA: Diagnosis present

## 2016-01-14 DIAGNOSIS — Z7189 Other specified counseling: Secondary | ICD-10-CM | POA: Diagnosis not present

## 2016-01-14 DIAGNOSIS — E785 Hyperlipidemia, unspecified: Secondary | ICD-10-CM | POA: Diagnosis present

## 2016-01-14 DIAGNOSIS — I10 Essential (primary) hypertension: Secondary | ICD-10-CM | POA: Diagnosis not present

## 2016-01-14 DIAGNOSIS — Z79899 Other long term (current) drug therapy: Secondary | ICD-10-CM | POA: Diagnosis not present

## 2016-01-14 DIAGNOSIS — Z66 Do not resuscitate: Secondary | ICD-10-CM | POA: Diagnosis present

## 2016-01-14 DIAGNOSIS — R531 Weakness: Secondary | ICD-10-CM | POA: Diagnosis present

## 2016-01-14 DIAGNOSIS — Z7982 Long term (current) use of aspirin: Secondary | ICD-10-CM | POA: Diagnosis not present

## 2016-01-14 DIAGNOSIS — Y929 Unspecified place or not applicable: Secondary | ICD-10-CM | POA: Diagnosis not present

## 2016-01-14 DIAGNOSIS — R471 Dysarthria and anarthria: Secondary | ICD-10-CM | POA: Diagnosis present

## 2016-01-14 DIAGNOSIS — R414 Neurologic neglect syndrome: Secondary | ICD-10-CM | POA: Diagnosis present

## 2016-01-14 DIAGNOSIS — H9191 Unspecified hearing loss, right ear: Secondary | ICD-10-CM | POA: Diagnosis present

## 2016-01-14 DIAGNOSIS — E890 Postprocedural hypothyroidism: Secondary | ICD-10-CM | POA: Diagnosis present

## 2016-01-14 DIAGNOSIS — Z515 Encounter for palliative care: Secondary | ICD-10-CM | POA: Diagnosis not present

## 2016-01-14 DIAGNOSIS — Z9849 Cataract extraction status, unspecified eye: Secondary | ICD-10-CM | POA: Diagnosis not present

## 2016-01-14 DIAGNOSIS — I63411 Cerebral infarction due to embolism of right middle cerebral artery: Secondary | ICD-10-CM | POA: Diagnosis present

## 2016-01-14 DIAGNOSIS — E871 Hypo-osmolality and hyponatremia: Secondary | ICD-10-CM | POA: Diagnosis present

## 2016-01-14 DIAGNOSIS — Z8249 Family history of ischemic heart disease and other diseases of the circulatory system: Secondary | ICD-10-CM | POA: Diagnosis not present

## 2016-01-14 DIAGNOSIS — I442 Atrioventricular block, complete: Secondary | ICD-10-CM | POA: Diagnosis present

## 2016-01-14 LAB — CBC
HCT: 36.3 % (ref 36.0–46.0)
HEMOGLOBIN: 11.8 g/dL — AB (ref 12.0–15.0)
MCH: 29.4 pg (ref 26.0–34.0)
MCHC: 32.5 g/dL (ref 30.0–36.0)
MCV: 90.5 fL (ref 78.0–100.0)
Platelets: 363 10*3/uL (ref 150–400)
RBC: 4.01 MIL/uL (ref 3.87–5.11)
RDW: 13.8 % (ref 11.5–15.5)
WBC: 11.4 10*3/uL — ABNORMAL HIGH (ref 4.0–10.5)

## 2016-01-14 LAB — BASIC METABOLIC PANEL
Anion gap: 12 (ref 5–15)
BUN: 17 mg/dL (ref 6–20)
CHLORIDE: 97 mmol/L — AB (ref 101–111)
CO2: 25 mmol/L (ref 22–32)
Calcium: 9.3 mg/dL (ref 8.9–10.3)
Creatinine, Ser: 1.25 mg/dL — ABNORMAL HIGH (ref 0.44–1.00)
GFR calc non Af Amer: 36 mL/min — ABNORMAL LOW (ref >60)
GFR, EST AFRICAN AMERICAN: 42 mL/min — AB (ref >60)
Glucose, Bld: 124 mg/dL — ABNORMAL HIGH (ref 65–99)
POTASSIUM: 4 mmol/L (ref 3.5–5.1)
SODIUM: 134 mmol/L — AB (ref 135–145)

## 2016-01-14 LAB — TROPONIN I
TROPONIN I: 0.15 ng/mL — AB (ref <0.03)
Troponin I: 0.14 ng/mL (ref <0.03)

## 2016-01-14 LAB — LIPID PANEL
Cholesterol: 155 mg/dL (ref 0–200)
HDL: 49 mg/dL (ref >40)
LDL Cholesterol: 91 mg/dL (ref 0–99)
Total CHOL/HDL Ratio: 3.2 RATIO
Triglycerides: 73 mg/dL (ref <150)
VLDL: 15 mg/dL (ref 0–40)

## 2016-01-14 LAB — MRSA PCR SCREENING: MRSA BY PCR: NEGATIVE

## 2016-01-14 MED ORDER — IOPAMIDOL (ISOVUE-370) INJECTION 76%
INTRAVENOUS | Status: AC
  Administered 2016-01-14: 50 mL via INTRAVENOUS
  Filled 2016-01-14: qty 50

## 2016-01-14 MED ORDER — RESOURCE THICKENUP CLEAR PO POWD
Freq: Once | ORAL | Status: DC
  Filled 2016-01-14: qty 125

## 2016-01-14 MED ORDER — ASPIRIN 300 MG RE SUPP
300.0000 mg | Freq: Every day | RECTAL | Status: DC
  Administered 2016-01-15 – 2016-01-16 (×2): 300 mg via RECTAL
  Filled 2016-01-14 (×3): qty 1

## 2016-01-14 MED ORDER — ASPIRIN EC 325 MG PO TBEC: 325.0000 mg | DELAYED_RELEASE_TABLET | Freq: Every day | ORAL | Status: DC

## 2016-01-14 MED ORDER — ATORVASTATIN CALCIUM 10 MG PO TABS: 10.0000 mg | ORAL_TABLET | Freq: Every day | ORAL | Status: DC

## 2016-01-14 NOTE — Evaluation (Signed)
Physical Therapy Evaluation Patient Details Name: Bethany Landry MRN: [id number] DOB: Jan 21, 1921 Today's Date: 01/14/2016   History of Present Illness   Bethany Landry is a 82 y.o. female with medical history significant of sys & dys CHF EF 25-30%, CKD stage III, hypothyroidism, and s/p PM for complete heart block; who presents for sudden onset of left-sided weakness. Recently discharged due to fall resulting in rib fxs and L clavicle fx  Clinical Impression  Patient presents with decreased mobility due to deficits listed in PT problem list.  She is mainly limited by L inattention, imbalance and poor safety awareness at high risk for falling.  Feel she needs inpatient skilled rehab at SNF level if able to qualify with inpatient status.  Will follow acutely to address issues and improve safety and participation in mobility tasks.    Follow Up Recommendations SNF (if able to get inpatient status)    Equipment Recommendations  None recommended by PT    Recommendations for Other Services       Precautions / Restrictions Precautions Precautions: Fall Precaution Comments: tender L UE and rib areas      Mobility  Bed Mobility Overal bed mobility: Needs Assistance Bed Mobility: Rolling;Supine to Sit;Sit to Supine Rolling: Max assist;+2 for physical assistance   Supine to sit: Max assist Sit to supine: Total assist;+2 for physical assistance   General bed mobility comments: assist to bring trunk upright, pt already getting legs off bed; facilitated at head due to capital and cervical extension at rest in bed  Transfers Overall transfer level: Needs assistance Equipment used: 2 person hand held assist Transfers: Sit to/from Stand Sit to Stand: Mod assist;+2 physical assistance Stand pivot transfers: Mod assist;+2 physical assistance       General transfer comment: pt initiating sit to stand and stood for hygiene due to incontinent of urine; maintains flexed posture and L lateral lean  with support for balance,   Ambulation/Gait             General Gait Details: unable at this time  Stairs            Wheelchair Mobility    Modified Rankin (Stroke Patients Only) Modified Rankin (Stroke Patients Only) Pre-Morbid Rankin Score: Moderately severe disability Modified Rankin: Severe disability     Balance Overall balance assessment: Needs assistance   Sitting balance-Leahy Scale: Poor Sitting balance - Comments: leaning to L with poor trunk control Postural control: Left lateral lean   Standing balance-Leahy Scale: Zero Standing balance comment: standing for hygiene with max A of 1 for about 45 seconds                             Pertinent Vitals/Pain Pain Assessment: Faces Faces Pain Scale: Hurts even more Pain Location: R shoulder and ribs with movement at times Pain Descriptors / Indicators: Discomfort;Grimacing Pain Intervention(s): Monitored during session;Repositioned    Home Living Family/patient expects to be discharged to:: Skilled nursing facility                 Additional Comments: Info obtained from chart of last admission, pt unable to give accurate info. Pt was at [facility]    Prior Function Level of Independence: Independent with assistive device(s)         Comments: Info obtained from chart of last admission, pt unable to give accurate info. Pt was at [facility] and was using RW, uncertain if she had assist with  ADLs     Hand Dominance   Dominant Hand:  (uncertain, pt with recent hx of R clavicle fx and now L hemiplegia)    Extremity/Trunk Assessment   Upper Extremity Assessment: Defer to OT evaluation RUE Deficits / Details: very litte movement, recent hx of R clavicle fx     LUE Deficits / Details: hemiplegic, flaccid tone   Lower Extremity Assessment: RLE deficits/detail;LLE deficits/detail;Difficult to assess due to impaired cognition RLE Deficits / Details: moves antigravity without  difficulty, unable to formally assess due to cognition LLE Deficits / Details: lifts weakly against gravity with increased time and cues, limited assessment due to cognition, but obvious weakness and limited ability to follow commands on that side with inattention noted  Cervical / Trunk Assessment: Kyphotic  Communication   Communication: HOH  Cognition Arousal/Alertness: Awake/alert Behavior During Therapy: Anxious;Impulsive Overall Cognitive Status: Impaired/Different from baseline Area of Impairment: Orientation;Attention;Memory;Following commands;Safety/judgement;Awareness;Problem solving Orientation Level: Place;Time;Situation   Memory: Decreased short-term memory Following Commands: Follows one step commands inconsistently Safety/Judgement: Decreased awareness of safety;Decreased awareness of deficits   Problem Solving: Slow processing;Decreased initiation;Difficulty sequencing;Requires verbal cues;Requires tactile cues General Comments: Patient with h/o dementia, limited ability to follow commands or respond to questions    General Comments General comments (skin integrity, edema, etc.): R shoulder with obvious joint derangement and some bruising, but still using R UE on rail for support despite cues and assist to keep R arms resting; also noted oculomotor deficits with difficulty looking past midline to L, but L eye able to move past midline, R eye stops at midline.      Exercises     Assessment/Plan    PT Assessment Patient needs continued PT services  PT Problem List Decreased strength;Decreased balance;Decreased knowledge of use of DME;Impaired sensation;Decreased activity tolerance;Decreased mobility;Decreased safety awareness;Decreased cognition          PT Treatment Interventions Therapeutic activities;Therapeutic exercise;Patient/family education;Balance training;Functional mobility training;Wheelchair mobility training;DME instruction    PT Goals (Current goals can  be found in the Care Plan section)  Acute Rehab PT Goals Patient Stated Goal: did not state PT Goal Formulation: Patient unable to participate in goal setting Time For Goal Achievement: 01/28/16 Potential to Achieve Goals: Fair    Frequency Min 2X/week   Barriers to discharge        Co-evaluation               End of Session Equipment Utilized During Treatment: Gait belt Activity Tolerance: Patient limited by fatigue Patient left: in bed;with call bell/phone within reach;with bed alarm set      Functional Assessment Tool Used: Clinical Judgement Functional Limitation: Mobility: Walking and moving around Mobility: Walking and Moving Around Current Status ([id number]): At least 60 percent but less than 80 percent impaired, limited or restricted Mobility: Walking and Moving Around Goal Status [phone]): At least 40 percent but less than 60 percent impaired, limited or restricted    Time: 1015-1037 PT Time Calculation (min) (ACUTE ONLY): 22 min   Charges:   PT Evaluation $PT Eval Moderate Complexity: 1 Procedure     PT G Codes:   PT G-Codes **NOT FOR INPATIENT CLASS** Functional Assessment Tool Used: Clinical Judgement Functional Limitation: Mobility: Walking and moving around Mobility: Walking and Moving Around Current Status ([id number]): At least 60 percent but less than 80 percent impaired, limited or restricted Mobility: Walking and Moving Around Goal Status [phone]): At least 40 percent but less than 60 percent impaired, limited or restricted    Elray Mcgregor  01/14/2016, 1:56 PM  Sheran Lawlessyndi Nea [place], [place] [phone] 01/14/2016

## 2016-01-14 NOTE — Progress Notes (Signed)
PROGRESS NOTE    Bethany Landry  ZOX:[id number] DOB: 02/25/1921 DOA: 01/13/2016 PCP: Clelia Schaumann, MD   Outpatient Specialists:     Brief Narrative:  Bethany Landry is a 82 y.o. female with medical history significant of sys & dys CHF EF 25-30%, CKD stage III, hypothyroidism, and s/p PM for complete heart block; who presents for sudden onset of left-sided weakness. History is obtained by review of reports, the patient, and discussions with the patient's son Veralyn Lopp. Nursing home staff noted symptoms around 4:15 pm. Associated symptoms included left-sided facial droop with slurred speech. EMS was immediately called.   Note the patient had just been discharged from the hospital hospitalized at [facility] from 9/16 - 9/18 after having a fall suffering rib and clavicle fracture. Patient had positive orthostatic vital signs at that time, an elevated troponin thought secondary to chronic heart failure, and was possibly thought to have had an arrhythmia, but pacemaker interrogation was normal.  Patient notes that all week she has felt somewhat short of breath with intermittent wheezing. Otherwise she also complains of right shoulder pain with movement and has been using Tylenol to treat symptoms with moderate relief. She currently denies any significant weakness per se. At baseline she states that she gets around with a rolling walker. Patient's son, who was previously an anesthesiologist here at [facility], notes that they had previously backed off of her Lasix dosage during her last hospitalization in 9/16, but she had been having worsening shortness of breath. He advised that they increase her back to the 40 mg twice daily, which she has been taking over the last 1 week. He reports verifying this change was okay with Dr. Zoila Shutter the patient's cardiologist. Discussed with him regarding her recent lab work which he note appears to be at her baseline. He also notes that after her last  hospitalization she did not tolerate the right armsling and told staff to take it off. She followed up with orthopedics and they were okay with this.    Assessment & Plan:   Principal Problem:   Acute ischemic stroke [facility]) Active Problems:   Essential hypertension   Cardiac pacemaker   CKD (chronic kidney disease), stage III   Chronic hyponatremia   Chronic systolic CHF (congestive heart failure) (HCC)   Hypothyroidism   Closed right clavicular fracture   Dysarthria   Acute ischemic stroke with left hemineglect, left facial droop, and dysarthria:  - tele - Neuro checks - Allow for permissive hypertension - check hemoglobin A1c and lipid panel - ASA - statin - Appreciate neurology consultation: cont ASA, not a candidate for further anticoagulation due to falls -DYS diet  Right hemo- Pneumothorax - Continuous pulse oximetry with nasal cannula oxygen to keep O2 sats greater than 92% - not seen on repeat study -discussed with son and then pulm--- patient would not want invasive procedures-- will monitor and make comfortable if worsens  Elevated troponin: Similar to previous admission at 0.16 this appears to be the patient's baseline the result of likely CKD and CHF.   Combined congestive heart failure/ status post PM for complete heart block: Last echo on 9/18 EF noted to be 25-30% Patient appears dry, but son notes that they want to try and keep her dry as she has significant shortness of breath otherwise. - Continue Coreg and furosemide   Chronic kidney disease stage III:  Patient's baseline creatinine ranges from 0.9 to 1.4 - Follow-up repeat BMP in a.m.  Essential hypertension -  Allow for permissive hypertension at this time - Coreg and furosemide ordered  Fractured clavicle and shoulder: Occurred on 9/16. Patient did not tolerate sling. - Continue Tylenol prn- does not tolerate pain meds with out hallucinations  Hypothyroidism - Continue  Levothyroxine  Hyponatremia: Acute on chronic. sodium 126 on admission. Son states that baseline sodium is somewhere around 124. secondary to diuretic - Continue to monitor   Hypocalcemia: Ionized calcium 1.03 - 1 g Calcium gluconate IV x 1 dose now - Recheck ionized calcium in am and replace as needed  Discussed patient's status- not eating-- she has been going down-hill for weeks now--- agreeable with palliative care consult and possible transition to hospice   DVT prophylaxis:  SCD's  Code Status: DNR   Family Communication:   Disposition Plan:     Consultants:   Neuro  Palliative care    Subjective: Speech slurred  Objective: Vitals:   01/14/16 0035 01/14/16 0500 01/14/16 0745 01/14/16 1213  BP: (!) 189/85 (!) 159/78    Pulse: 100 90 88 81  Resp: 18 (!) 22 20 19   Temp: 98.4 F (36.9 C) 98.9 F (37.2 C) 98.4 F (36.9 C) 98.6 F (37 C)  TempSrc: Oral Oral Oral Oral  SpO2: 100% 100% 99% 98%  Weight:  51.9 kg (114 lb 8 oz)    Height:        Intake/Output Summary (Last 24 hours) at 01/14/16 1308 Last data filed at 01/14/16 0900  Gross per 24 hour  Intake              500 ml  Output              100 ml  Net              400 ml   Filed Weights   01/13/16 2314 01/14/16 0500  Weight: 51.8 kg (114 lb 1.6 oz) 51.9 kg (114 lb 8 oz)    Examination:  General exam: speech slurred Respiratory system: Clear to auscultation. Respiratory effort normal. Cardiovascular system: S1 & S2 heard, RRR. No JVD, murmurs, rubs, gallops or clicks. No pedal edema. Gastrointestinal system: Abdomen is nondistended, soft and nontender. No organomegaly or masses felt. Normal bowel sounds heard. Central nervous system: Alert unable to cooperate with neuro exam    Data Reviewed: I have personally reviewed following labs and imaging studies  CBC:  Recent Labs Lab 01/13/16 1745 01/13/16 1751 01/14/16 0256  WBC 9.7  --  11.4*  NEUTROABS 6.8  --   --   HGB 11.7*  12.6 11.8*  HCT 36.3 37.0 36.3  MCV 91.0  --  90.5  PLT 379  --  363   Basic Metabolic Panel:  Recent Labs Lab 01/13/16 1745 01/13/16 1751 01/14/16 0256  NA 126* 130* 134*  K 4.4 4.7 4.0  CL 90* 91* 97*  CO2 26  --  25  GLUCOSE 120* 116* 124*  BUN 20 30* 17  CREATININE 1.39* 1.40* 1.25*  CALCIUM 8.6*  --  9.3   GFR: Estimated Creatinine Clearance: 23 mL/min (by C-G formula based on SCr of 1.25 mg/dL (H)). Liver Function Tests:  Recent Labs Lab 01/13/16 1745  AST 20  ALT 13*  ALKPHOS 95  BILITOT 0.7  PROT 7.0  ALBUMIN 3.2*   No results for input(s): LIPASE, AMYLASE in the last 168 hours. No results for input(s): AMMONIA in the last 168 hours. Coagulation Profile:  Recent Labs Lab 01/13/16 1745  INR 1.08   Cardiac  Enzymes:  Recent Labs Lab 01/13/16 2210 01/14/16 0256  TROPONINI 0.17* 0.15*   BNP (last 3 results) No results for input(s): PROBNP in the last 8760 hours. HbA1C: No results for input(s): HGBA1C in the last 72 hours. CBG: No results for input(s): GLUCAP in the last 168 hours. Lipid Profile:  Recent Labs  01/14/16 0256  CHOL 155  HDL 49  LDLCALC 91  TRIG 73  CHOLHDL 3.2   Thyroid Function Tests: No results for input(s): TSH, T4TOTAL, FREET4, T3FREE, THYROIDAB in the last 72 hours. Anemia Panel: No results for input(s): VITAMINB12, FOLATE, FERRITIN, TIBC, IRON, RETICCTPCT in the last 72 hours. Urine analysis:    Component Value Date/Time   COLORURINE YELLOW 01/13/2016 1907   APPEARANCEUR HAZY (A) 01/13/2016 1907   LABSPEC <1.005 (L) 01/13/2016 1907   PHURINE 6.0 01/13/2016 1907   GLUCOSEU NEGATIVE 01/13/2016 1907   HGBUR NEGATIVE 01/13/2016 1907   BILIRUBINUR NEGATIVE 01/13/2016 1907   KETONESUR NEGATIVE 01/13/2016 1907   PROTEINUR NEGATIVE 01/13/2016 1907   UROBILINOGEN 0.2 10/12/2013 2057   NITRITE NEGATIVE 01/13/2016 1907   LEUKOCYTESUR SMALL (A) 01/13/2016 1907     ) Recent Results (from the past 240 hour(s))  MRSA  PCR Screening     Status: None   Collection Time: 01/13/16 11:32 PM  Result Value Ref Range Status   MRSA by PCR NEGATIVE NEGATIVE Final    Comment:        The GeneXpert MRSA Assay (FDA approved for NASAL specimens only), is one component of a comprehensive MRSA colonization surveillance program. It is not intended to diagnose MRSA infection nor to guide or monitor treatment for MRSA infections.       Anti-infectives    None       Radiology Studies: Ct Angio Head W Or Wo Contrast  Result Date: 01/14/2016 CLINICAL DATA:  82 year old female with left side weakness. Possible hyperdense right MCA branches on noncontrast head CT yesterday. Initial encounter. EXAM: CT ANGIOGRAPHY HEAD AND NECK TECHNIQUE: Multidetector CT imaging of the head and neck was performed using the standard protocol during bolus administration of intravenous contrast. Multiplanar CT image reconstructions and MIPs were obtained to evaluate the vascular anatomy. Carotid stenosis measurements (when applicable) are obtained utilizing NASCET criteria, using the distal internal carotid diameter as the denominator. CONTRAST:  50 mL Isovue 370 COMPARISON:  Noncontrast head CT 01/13/2016 and earlier. Cervical spine CT 12/29/2015. FINDINGS: CTA NECK Skeleton: Stable cervical spine from the recent noncontrast CT. Osteopenia. Mild T2 superior endplate deformity appears stable. Displaced fractures of the right lateral second through fifth ribs. Age indeterminate fracture of the right posterior fourth rib. A posterior fifth rib fracture appears to be either acute or subacute. There is a chronic appearing right posterior lateral sixth rib fracture. Other visualized osseous structures appear intact. Visualized paranasal sinuses and mastoids are stable and well pneumatized. Upper chest: Positive for right lung hydro pneumothorax. Small to moderate component of right lung pleural air (series 8, image 185). Small to moderate component of  layering intermediate density fluid which probably is blood. Superimposed centrilobular emphysema. Left chest cardiac pacemaker. No superior mediastinal lymphadenopathy. Small volume retained secretions in the trachea. Other neck: Diminutive thyroid. Negative larynx, pharynx, parapharyngeal spaces, retropharyngeal space, sublingual space, submandibular glands and parotid glands. No cervical lymphadenopathy. Aortic arch: 3 vessel arch configuration. Calcified arch atherosclerosis, but capacious arch and great vessel origins. Right carotid system: Widely patent right cervical carotid. Mild to moderate calcified plaque at the right carotid bifurcation without  stenosis. Left carotid system: Widely patent left cervical carotid. Mild to moderate calcified plaque at the left ICA origin and bulb without stenosis. Vertebral arteries: No proximal right subclavian artery stenosis with mild calcified plaque. Normal right vertebral artery origin. Tortuous right V2 segment but no right vertebral artery stenosis to the skullbase. No proximal left subclavian artery stenosis despite calcified plaque. Normal left vertebral artery origin. Tortuous left V2 segment. No left vertebral artery stenosis to the skullbase. CTA HEAD Posterior circulation: Patent PICA origins. There is mild irregularity and stenosis of both distal vertebral arteries just distal to the PICA origins. There is mild calcified plaque in the left V4 segment which itself does not result in stenosis. Patent vertebrobasilar junction. Widely patent basilar artery without stenosis. SCA and PCA origins are normal. Posterior communicating arteries are diminutive or absent. Mild left PCA irregularity. No significant PCA stenosis. Anterior circulation: Both ICA siphons are patent. There is calcified plaque affecting both siphons, up to moderate in slightly greater on the right, but no significant stenosis. Both carotid termini are patent. Both MCA and ACA origins are normal.  The right A1 is somewhat dominant. The anterior communicating artery and bilateral ACA branches are normal. Left MCA M1 segment, bifurcation, and left MCA branches are within normal limits. Right MCA M1 segment and bifurcation are patent and appear normal. No right MCA M2 branch occlusion is identified. In the middle sylvian M2 branch there is mild irregularity and stenosis demonstrated on series 605, image 6. There is preserved distal flow into downstream M3 branches. However, there is a paucity of middle sylvian M3 branches compared to the left side best seen on series 606, image 60. Venous sinuses: Patent. Anatomic variants: Dominant right A1 segment. Delayed phase: Cytotoxic edema has developed at the right operculum best seen on series 8 images 18 through 20. No associated hemorrhage or mass effect. No ventriculomegaly. Gray-white matter differentiation elsewhere appears stable. No abnormal enhancement identified. Review of the MIP images confirms the above findings IMPRESSION: 1. Positive for Right Hemo-pneumothorax, small to moderate. Multiple displaced right rib fractures; at least ribs two through five. And the fourth and fifth ribs are fractured in two places. 2. Negative for emergent large vessel occlusion. Generalized ectatic arteries in the head and neck with no significant large vessel stenosis despite calcified atherosclerosis. 3. There is Right MCA M3 branch Occlusion suspected. And there is an Evolving Right MCA Infarct at the operculum. No associated hemorrhage or mass effect. 4. Otherwise stable CT appearance of the brain. 5. Emphysema. 6. Preliminary report of the above was discussed by telephone with Dr. Marvel Plan on 01/14/2016 at 1157 hours. Electronically Signed   By: Odessa Fleming M.D.   On: 01/14/2016 12:21   Dg Chest 2 View  Result Date: 01/14/2016 CLINICAL DATA:  Right pneumothorax, CHF, chronic renal insufficiency, right clavicular and right rib fractures, acute CVA. EXAM: CHEST  2 VIEW  COMPARISON:  Portable chest x-ray of January 13, 2016 FINDINGS: The right-sided pneumothorax is not clearly evident today. Multiple right posterior and lateral rib fractures as well as distal right clavicular fractures are noted. A trace of pleural fluid is likely present on the right and small effusion on the left as well. Retrocardiac atelectasis on the left is stable. The heart is mildly enlarged. The pulmonary vascularity is not engorged. The permanent pacemaker is in stable position. There is calcification in the wall of the thoracic aorta. IMPRESSION: No definite right-sided pneumothorax visible today. Small bilateral pleural effusions. Known right  clavicular and right rib fractures. Left basilar atelectasis or pneumonia. No CHF. Aortic atherosclerosis. Electronically Signed   By: David  SwazilandJordan M.D.   On: 01/14/2016 07:24   Ct Angio Neck W Or Wo Contrast  Result Date: 01/14/2016 CLINICAL DATA:  82 year old female with left side weakness. Possible hyperdense right MCA branches on noncontrast head CT yesterday. Initial encounter. EXAM: CT ANGIOGRAPHY HEAD AND NECK TECHNIQUE: Multidetector CT imaging of the head and neck was performed using the standard protocol during bolus administration of intravenous contrast. Multiplanar CT image reconstructions and MIPs were obtained to evaluate the vascular anatomy. Carotid stenosis measurements (when applicable) are obtained utilizing NASCET criteria, using the distal internal carotid diameter as the denominator. CONTRAST:  50 mL Isovue 370 COMPARISON:  Noncontrast head CT 01/13/2016 and earlier. Cervical spine CT 12/29/2015. FINDINGS: CTA NECK Skeleton: Stable cervical spine from the recent noncontrast CT. Osteopenia. Mild T2 superior endplate deformity appears stable. Displaced fractures of the right lateral second through fifth ribs. Age indeterminate fracture of the right posterior fourth rib. A posterior fifth rib fracture appears to be either acute or subacute.  There is a chronic appearing right posterior lateral sixth rib fracture. Other visualized osseous structures appear intact. Visualized paranasal sinuses and mastoids are stable and well pneumatized. Upper chest: Positive for right lung hydro pneumothorax. Small to moderate component of right lung pleural air (series 8, image 185). Small to moderate component of layering intermediate density fluid which probably is blood. Superimposed centrilobular emphysema. Left chest cardiac pacemaker. No superior mediastinal lymphadenopathy. Small volume retained secretions in the trachea. Other neck: Diminutive thyroid. Negative larynx, pharynx, parapharyngeal spaces, retropharyngeal space, sublingual space, submandibular glands and parotid glands. No cervical lymphadenopathy. Aortic arch: 3 vessel arch configuration. Calcified arch atherosclerosis, but capacious arch and great vessel origins. Right carotid system: Widely patent right cervical carotid. Mild to moderate calcified plaque at the right carotid bifurcation without stenosis. Left carotid system: Widely patent left cervical carotid. Mild to moderate calcified plaque at the left ICA origin and bulb without stenosis. Vertebral arteries: No proximal right subclavian artery stenosis with mild calcified plaque. Normal right vertebral artery origin. Tortuous right V2 segment but no right vertebral artery stenosis to the skullbase. No proximal left subclavian artery stenosis despite calcified plaque. Normal left vertebral artery origin. Tortuous left V2 segment. No left vertebral artery stenosis to the skullbase. CTA HEAD Posterior circulation: Patent PICA origins. There is mild irregularity and stenosis of both distal vertebral arteries just distal to the PICA origins. There is mild calcified plaque in the left V4 segment which itself does not result in stenosis. Patent vertebrobasilar junction. Widely patent basilar artery without stenosis. SCA and PCA origins are normal.  Posterior communicating arteries are diminutive or absent. Mild left PCA irregularity. No significant PCA stenosis. Anterior circulation: Both ICA siphons are patent. There is calcified plaque affecting both siphons, up to moderate in slightly greater on the right, but no significant stenosis. Both carotid termini are patent. Both MCA and ACA origins are normal. The right A1 is somewhat dominant. The anterior communicating artery and bilateral ACA branches are normal. Left MCA M1 segment, bifurcation, and left MCA branches are within normal limits. Right MCA M1 segment and bifurcation are patent and appear normal. No right MCA M2 branch occlusion is identified. In the middle sylvian M2 branch there is mild irregularity and stenosis demonstrated on series 605, image 6. There is preserved distal flow into downstream M3 branches. However, there is a paucity of middle sylvian M3  branches compared to the left side best seen on series 606, image 60. Venous sinuses: Patent. Anatomic variants: Dominant right A1 segment. Delayed phase: Cytotoxic edema has developed at the right operculum best seen on series 8 images 18 through 20. No associated hemorrhage or mass effect. No ventriculomegaly. Gray-white matter differentiation elsewhere appears stable. No abnormal enhancement identified. Review of the MIP images confirms the above findings IMPRESSION: 1. Positive for Right Hemo-pneumothorax, small to moderate. Multiple displaced right rib fractures; at least ribs two through five. And the fourth and fifth ribs are fractured in two places. 2. Negative for emergent large vessel occlusion. Generalized ectatic arteries in the head and neck with no significant large vessel stenosis despite calcified atherosclerosis. 3. There is Right MCA M3 branch Occlusion suspected. And there is an Evolving Right MCA Infarct at the operculum. No associated hemorrhage or mass effect. 4. Otherwise stable CT appearance of the brain. 5. Emphysema. 6.  Preliminary report of the above was discussed by telephone with Dr. Marvel Plan on 01/14/2016 at 1157 hours. Electronically Signed   By: Odessa Fleming M.D.   On: 01/14/2016 12:21   Dg Chest Portable 1 View  Result Date: 01/13/2016 CLINICAL DATA:  Left-sided weakness. EXAM: PORTABLE CHEST 1 VIEW COMPARISON:  Radiographs of December 30, 2015. FINDINGS: Stable cardiomediastinal silhouette. Atherosclerosis of thoracic aorta is noted. Left-sided pacemaker is unchanged in position. Status post posterior fixation of lower thoracic and lumbar spine. Combination of old and new right rib fractures are noted. Mild right apical and basilar pneumothorax is noted. Left lung is clear. IMPRESSION: Aortic atherosclerosis. Stable right rib fractures are noted. Interval development of mild right apical and basilar pneumothorax. Critical Value/emergent results were called by telephone at the time of interpretation on 01/13/2016 at 7:39 pm to Dr. Alona Bene , who verbally acknowledged these results. Electronically Signed   By: Lupita Raider, M.D.   On: 01/13/2016 19:38   Ct Head Code Stroke Wo Contrast`  Addendum Date: 01/13/2016   ADDENDUM REPORT: 01/13/2016 18:46 ADDENDUM: Small bilateral cerebellar infarcts were present 12/29/2015, new when compared to 05/04/2015. Electronically Signed   By: Marnee Spring M.D.   On: 01/13/2016 18:46   Result Date: 01/13/2016 CLINICAL DATA:  Code stroke.  Left-sided weakness. EXAM: CT HEAD WITHOUT CONTRAST TECHNIQUE: Contiguous axial images were obtained from the base of the skull through the vertex without intravenous contrast. COMPARISON:  12/29/2015 FINDINGS: Brain: Interval moderate-sized infarct involving the left occipital lobe, nonhemorrhagic. Suspicious new density of a distal right M2 branch, 201:17 and 18. Small chronic posterior right frontal cortex infarct, best seen on sagittal reformats, stable. No acute hemorrhage, hydrocephalus, or mass. Vascular: Concern for right MCA branch  thrombus, as above. Skull: No acute finding Sinuses/Orbits: Negative Other: Results paged at the time of interpretation on 01/13/2016 at 6:01 pm to Dr. Roxy Manns, who verbally acknowledged these results. ASPECTS (Alberta Stroke Program Early CT Score) - Ganglionic level infarction (caudate, lentiform nuclei, internal capsule, insula, M1-M3 cortex): 7 - Supraganglionic infarction (M4-M6 cortex): 3 (when excluding small chronic infarct described above) Total score (0-10 with 10 being normal): 10 IMPRESSION: 1. Suspected high-density thrombus in distal right M2 branches. Small chronic cortical infarct in this distribution but no visible acute right hemispheric infarct. 2. Acute or subacute moderate size left occipital infarct, new from 12/29/2015. 3. ASPECTS is 10 on the right when accounting for chronic changes. 4. No acute hemorrhage. Electronically Signed: By: Marnee Spring M.D. On: 01/13/2016 18:08  Scheduled Meds: .  stroke: mapping our early stages of recovery book   Does not apply Once  . aspirin EC  325 mg Oral Daily  . aspirin  300 mg Rectal Daily  . atorvastatin  10 mg Oral q1800  . carvedilol  3.125 mg Oral BID WC  . enoxaparin (LOVENOX) injection  30 mg Subcutaneous Daily  . furosemide  40 mg Oral BID  . levothyroxine  125 mcg Oral QAC breakfast  . neomycin-bacitracin-polymyxin  1 application Topical Daily  . RESOURCE THICKENUP CLEAR   Oral Once   Continuous Infusions:    LOS: 0 days    Time spent: 25 min    Alvester Eads Juanetta Gosling, DO [facility] Pager [phone]  If 7PM-7AM, please contact night-coverage [email or web address] Password [email or web address] 01/14/2016, 1:08 PM

## 2016-01-14 NOTE — Evaluation (Signed)
Clinical/Bedside Swallow Evaluation Patient Details  Name: Bethany Landry MRN: [id number] Date of Birth: June 29, 1920  Today's Date: 01/14/2016 Time: SLP Start Time (ACUTE ONLY): 0955 SLP Stop Time (ACUTE ONLY): 1012 SLP Time Calculation (min) (ACUTE ONLY): 17 min  Past Medical History:  Past Medical History:  Diagnosis Date  . Benign hypertension   . CKD (chronic kidney disease), stage III    moderate  . Complete heart block (HCC)   . DOE (dyspnea on exertion)   . Fungal infection    recurrent  . Hyperlipidemia   . Hypothyroidism   . Insomnia   . Mastoiditis    hearing loss, s/p mastoiectomy, right ear  . Osteoarthritis   . Osteoporosis   . Sinoatrial node dysfunction (HCC)    Past Surgical History:  Past Surgical History:  Procedure Laterality Date  . BACK SURGERY    . CATARACT EXTRACTION    . INSERT / REPLACE / REMOVE PACEMAKER  11/26/09   [employer], DDD PM  . LAPAROTOMY     Ex lap for abdominal pain (ischemic colitis)  . MASTOIDECTOMY Right    Tumor removal and residual recurrent fungal infection. Dr. Pollyann Kennedyosen  . THYROIDECTOMY     HPI:  82 y.o. female with hx significant for PM, CHF EF 25-30%, CKD stage III, admitted with acute ischemic stroke with left hemineglect, left facial droop,and dysarthria.  [facility]/16 - 9/18 after having a fall suffering rib and clavicle fracture.     Assessment / Plan / Recommendation Clinical Impression  Pt presents with dysphagia with concerns for airway protection with thin liquids.  Consumption of water elicited consistent cough response, concerning for aspiration.  Pt does have remote hx of dysphagia with silent aspiration noted per 2015 MBS.  She intermittently refused POs offered to her; she is restless, attempting to climb over the bed rails and difficult to redirect.  For today, recommend beginning a conservative diet of dysphagia 1 with honey-thick liquids; crush meds in puree.  Pt may refuse to eat.  SLP will follow  for readiness for diet advancement and to determine value of instrumental swallow study.  D/W RN.     Aspiration Risk  Moderate aspiration risk    Diet Recommendation     Medication Administration: Crushed with puree    Other  Recommendations Oral Care Recommendations: Oral care BID Other Recommendations: Order thickener from pharmacy   Follow up Recommendations  (tba)      Frequency and Duration min 2x/week  2 weeks       Prognosis Prognosis for Safe Diet Advancement: Good      Swallow Study   General Date of Onset: 01/13/16 HPI: 82 y.o. female with hx significant for PM, CHF EF 25-30%, CKD stage III, admitted with acute ischemic stroke with left hemineglect, left facial droop,and dysarthria.  [facility] 9/18 after having a fall suffering rib and clavicle fracture.   Type of Study: Bedside Swallow Evaluation Previous Swallow Assessment: 11/10/13 OP MBS which revealed silent aspiration of thin liquids, prevented with use of chin tuck Diet Prior to this Study: NPO Temperature Spikes Noted: No Respiratory Status: Room air History of Recent Intubation: No Behavior/Cognition: Alert;Confused Oral Cavity Assessment: Within Functional Limits Oral Care Completed by SLP: No Oral Cavity - Dentition: Adequate natural dentition Self-Feeding Abilities: Needs assist Patient Positioning: Upright in bed Baseline Vocal Quality: Normal Volitional Cough: Strong Volitional Swallow: Able to elicit    Oral/Motor/Sensory Function Overall Oral Motor/Sensory Function: Mild  impairment Facial ROM: Suspected CN VII (facial) dysfunction Facial Symmetry: Suspected CN VII (facial) dysfunction (mild left)   Ice Chips Ice chips: Within functional limits   Thin Liquid Thin Liquid: Impaired Presentation: Straw;Cup Pharyngeal  Phase Impairments: Cough - Immediate    Nectar Thick Nectar Thick Liquid: Not tested   Honey Thick Honey Thick Liquid: Within functional  limits Presentation: Cup   Puree Puree: Within functional limits   Solid   GO   Solid: Not tested       [name], MA CCC/SLP Pager [phone]  [name] 01/14/2016,10:17 AM

## 2016-01-14 NOTE — Evaluation (Signed)
Occupational Therapy Evaluation Patient Details Name: Bethany Landry MRN: [id number] DOB: 1920-11-19 Today's Date: 01/14/2016    History of Present Illness  Bethany Landry is a 82 y.o. female with medical history significant of sys & dys CHF EF 25-30%, CKD stage III, hypothyroidism, and s/p PM for complete heart block; who presents for sudden onset of left-sided weakness. Recently discharged due to fall resulting in rib fxs and L clavicle fx   Clinical Impression   Pt with decline in function and safety with decreased strength, balance, endurance, cognition, L hemiplegia, recent hx of R rib and clavicle fxs and vision impairments. Pt is confused and has very Poor sitting balance; required + 2 assist with bed mobility and sit - stand. Pt with difficulty following commands. Pt would benefit form acute OT services to address impairments to increase level of function and safety    Follow Up Recommendations  SNF;Supervision/Assistance - 24 hour    Equipment Recommendations  Other (comment) (TBD)    Recommendations for Other Services       Precautions / Restrictions Precautions Precautions: Fall Precaution Comments: tender L UE and rib areas      Mobility Bed Mobility Overal bed mobility: Needs Assistance Bed Mobility: Supine to Sit;Sit to Supine     Supine to sit: Total assist;+2 for physical assistance Sit to supine: Total assist;+2 for physical assistance      Transfers Overall transfer level: Needs assistance   Transfers: Sit to/from Stand Sit to Stand: Mod assist;+2 physical assistance              Balance Overall balance assessment: Needs assistance   Sitting balance-Leahy Scale: Poor       Standing balance-Leahy Scale: Poor                              ADL Overall ADL's : Needs assistance/impaired Eating/Feeding: NPO   Grooming: Total assistance   Upper Body Bathing: Total assistance   Lower Body Bathing: Total assistance   Upper  Body Dressing : Total assistance   Lower Body Dressing: Total assistance   Toilet Transfer: Moderate assistance;+2 for safety/equipment;BSC Toilet Transfer Details (indicate cue type and reason): also using bed pan Toileting- Clothing Manipulation and Hygiene: Total assistance       Functional mobility during ADLs: +2 for physical assistance;Moderate assistance General ADL Comments: pt with very Poor sitting balance, confused. Total A +2 for bed mobility to sit EOB with max A for support/balance     Vision Vision Assessment?: Yes Additional Comments:  (R gaze, L neglect)              Pertinent Vitals/Pain Pain Assessment: Faces Faces Pain Scale: Hurts even more Pain Location: R UE ad rib area Pain Descriptors / Indicators: Grimacing;Guarding Pain Intervention(s): Limited activity within patient's tolerance;Monitored during session;Repositioned     Hand Dominance  (uncertain, pt with recent hx of R clavicle fx and now L hemiplegia)   Extremity/Trunk Assessment Upper Extremity Assessment Upper Extremity Assessment: Generalized weakness;RUE deficits/detail;LUE deficits/detail RUE Deficits / Details: very litte movement, recent hx of R clavicle fx LUE Deficits / Details: hemiplegic, flaccid tone           Communication Communication Communication: HOH   Cognition Arousal/Alertness: Awake/alert Behavior During Therapy: Impulsive;Anxious Overall Cognitive Status: Impaired/Different from baseline Area of Impairment: Orientation;Attention;Memory;Following commands;Safety/judgement;Awareness;Problem solving Orientation Level: Place;Time;Situation   Memory: Decreased short-term memory Following Commands: Follows one step commands inconsistently Safety/Judgement: Decreased awareness  of safety;Decreased awareness of deficits   Problem Solving: Slow processing;Decreased initiation;Difficulty sequencing;Requires verbal cues;Requires tactile cues     General Comments   pt  pleasantly confused                 Home Living Family/patient expects to be discharged to:: Skilled nursing facility                                 Additional Comments: Info obtained from chart of last admission, pt unable to give accurate info. Pt was at [facility]      Prior Functioning/Environment Level of Independence: Independent with assistive device(s)        Comments: Info obtained from chart of last admission, pt unable to give accurate info. Pt was at [facility] and was using RW, uncertain if she had assist with ADLs        OT Problem List: Decreased strength;Impaired balance (sitting and/or standing);Decreased cognition;Decreased knowledge of precautions;Impaired tone;Pain;Decreased range of motion;Impaired vision/perception;Decreased safety awareness;Decreased activity tolerance;Decreased coordination;Decreased knowledge of use of DME or AE;Impaired UE functional use   OT Treatment/Interventions: Self-care/ADL training;DME and/or AE instruction;Therapeutic activities;Balance training;Patient/family education;Therapeutic exercise    OT Goals(Current goals can be found in the care plan section) Acute Rehab OT Goals Patient Stated Goal: did not state OT Goal Formulation: Patient unable to participate in goal setting Time For Goal Achievement: 01/21/16 Potential to Achieve Goals: Good ADL Goals Pt Will Perform Grooming: with max assist;with mod assist;sitting Pt Will Transfer to Toilet: with min assist;with +2 assist;bedside commode Additional ADL Goal #1: Pt will tolerate L UE ROM in all planes  Additional ADL Goal #2: Pt will identify 2/3 items on L side with mod multimodal cues  OT Frequency: Min 2X/week   Barriers to D/C: Decreased caregiver support                        End of Session Equipment Utilized During Treatment: Gait belt  Activity Tolerance: Patient limited by pain;Patient limited by fatigue;Other (comment)  (confusion) Patient left: in bed;with call bell/phone within reach;with nursing/sitter in room;Other (comment) (with PT)   Time: 1610-9604 OT Time Calculation (min): 37 min Charges:  OT General Charges $OT Visit: 1 Procedure OT Evaluation $OT Eval Moderate Complexity: 1 Procedure OT Treatments $Self Care/Home Management : 8-22 mins $Therapeutic Activity: 8-22 mins G-Codes: OT G-codes **NOT FOR INPATIENT CLASS** Functional Assessment Tool Used: clinical judgement Functional Limitation: Self care Self Care Current Status (V4098): At least 80 percent but less than 100 percent impaired, limited or restricted Self Care Goal Status (J1914): At least 60 percent but less than 80 percent impaired, limited or restricted  Galen Manila 01/14/2016, 1:07 PM

## 2016-01-14 NOTE — Progress Notes (Signed)
STROKE TEAM PROGRESS NOTE   SUBJECTIVE (INTERVAL HISTORY) Her speech pathologist is at the bedside.  Overall she feels her condition is unchanged. She still has left neglect, left hemiplegia and right gaze. However, language function preserved.    OBJECTIVE Temp:  [98.4 F (36.9 C)-98.9 F (37.2 C)] 98.4 F (36.9 C) (10/02 0745) Pulse Rate:  [77-100] 88 (10/02 0745) Cardiac Rhythm: Ventricular paced (10/02 0718) Resp:  [18-28] 20 (10/02 0745) BP: (143-189)/(65-107) 159/78 (10/02 0500) SpO2:  [98 %-100 %] 99 % (10/02 0745) Weight:  [114 lb 1.6 oz (51.8 kg)-114 lb 8 oz (51.9 kg)] 114 lb 8 oz (51.9 kg) (10/02 0500)  No results for input(s): GLUCAP in the last 168 hours.  Recent Labs Lab 01/13/16 1745 01/13/16 1751 01/14/16 0256  NA 126* 130* 134*  K 4.4 4.7 4.0  CL 90* 91* 97*  CO2 26  --  25  GLUCOSE 120* 116* 124*  BUN 20 30* 17  CREATININE 1.39* 1.40* 1.25*  CALCIUM 8.6*  --  9.3    Recent Labs Lab 01/13/16 1745  AST 20  ALT 13*  ALKPHOS 95  BILITOT 0.7  PROT 7.0  ALBUMIN 3.2*    Recent Labs Lab 01/13/16 1745 01/13/16 1751 01/14/16 0256  WBC 9.7  --  11.4*  NEUTROABS 6.8  --   --   HGB 11.7* 12.6 11.8*  HCT 36.3 37.0 36.3  MCV 91.0  --  90.5  PLT 379  --  363    Recent Labs Lab 01/13/16 2210 01/14/16 0256  TROPONINI 0.17* 0.15*    Recent Labs  01/13/16 1745  LABPROT 14.1  INR 1.08    Recent Labs  01/13/16 1907  COLORURINE YELLOW  LABSPEC <1.005*  PHURINE 6.0  GLUCOSEU NEGATIVE  HGBUR NEGATIVE  BILIRUBINUR NEGATIVE  KETONESUR NEGATIVE  PROTEINUR NEGATIVE  NITRITE NEGATIVE  LEUKOCYTESUR SMALL*       Component Value Date/Time   CHOL 155 01/14/2016 0256   TRIG 73 01/14/2016 0256   HDL 49 01/14/2016 0256   CHOLHDL 3.2 01/14/2016 0256   VLDL 15 01/14/2016 0256   LDLCALC 91 01/14/2016 0256   No results found for: HGBA1C    Component Value Date/Time   LABOPIA NONE DETECTED 01/13/2016 1907   COCAINSCRNUR NONE DETECTED  01/13/2016 1907   LABBENZ NONE DETECTED 01/13/2016 1907   AMPHETMU NONE DETECTED 01/13/2016 1907   THCU NONE DETECTED 01/13/2016 1907   LABBARB NONE DETECTED 01/13/2016 1907     Recent Labs Lab 01/13/16 1745  ETH <5    I have personally reviewed the radiological images below and agree with the radiology interpretations.  Dg Chest 2 View 01/14/2016 IMPRESSION: No definite right-sided pneumothorax visible today. Small bilateral pleural effusions. Known right clavicular and right rib fractures. Left basilar atelectasis or pneumonia. No CHF. Aortic atherosclerosis.   Ct Head Code Stroke Wo Contrast` 01/13/2016 IMPRESSION: 1. Suspected high-density thrombus in distal right M2 branches. Small chronic cortical infarct in this distribution but no visible acute right hemispheric infarct. 2. Acute or subacute moderate size left occipital infarct, new from 12/29/2015. 3. ASPECTS is 10 on the right when accounting for chronic changes. 4. No acute hemorrhage.   CTA head and neck pending  2D Echocardiogram limited pending  EKG A-V pacing   PHYSICAL EXAM  Temp:  [98.4 F (36.9 C)-98.9 F (37.2 C)] 98.4 F (36.9 C) (10/02 0745) Pulse Rate:  [77-100] 88 (10/02 0745) Resp:  [18-28] 20 (10/02 0745) BP: (143-189)/(65-107) 159/78 (10/02 0500) SpO2:  [  98 %-100 %] 99 % (10/02 0745) Weight:  [114 lb 1.6 oz (51.8 kg)-114 lb 8 oz (51.9 kg)] 114 lb 8 oz (51.9 kg) (10/02 0500)  General - Well nourished, well developed, lethargic.  Ophthalmologic - fundi not visualized due to noncooperation.  Cardiovascular - Regular rate and rhythm.  Neuro  - Level of arousal and orientation to year, place, and person were intact, but not orientated to month. Intact on simple sentence expression, following simple commands, intact at naming and repetition but moderate to severe dysarthria. Left nasolabial fold flattening, right gaze not able to cross midline. Right side neglect, not blinking to visual threat on the  left. Anosognosia and autotopagnosia on the left. Tongue in middle. Left UE flaccid, no movement on pain. LLE no spontaneous movement, but 2/5 on pain stimulation. No babinski and DTR 1+. Sensation, coordination and gait not tested.    ASSESSMENT/PLAN Bethany Landry is a 82 y.o. female with history of HTN, CKD, CHF with EF 25-30%, complete heart block s/p pacemaker, HLD and ALF resident admitted for left hemiplegia and slurry speech.     Stroke:  Left occipital subacute infarcts and likely right MCA infarct, embolic secondary to cardiomyopathy with low EF vs. Undiagnosed afib  MRI / MRA - not able to perform due to pacemaker  CTA head and neck pending  2D Echo limited pending - 12/30/15 EF 25-30% down from 45% in 04/2015  Pacemaker interrogation pending  LDL 91  HgbA1c pending  UDS negative  lovenox for VTE prophylaxis  DIET - DYS 1 Room service appropriate? Yes; Fluid consistency: Honey Thick   aspirin 81 mg daily prior to admission, now on aspirin 81 mg daily. Increase ASA to 325mg  daily.   Patient counseled to be compliant with her antithrombotic medications  Ongoing aggressive stroke risk factor management  Therapy recommendations:  pending  Disposition:  Pending  CHF with low EF  EF 25-30% in 12/2015 down from 45% in 04/2015  Home meds: coreg, lasix, resumed on admission  Limited TTE pending  With embolic this time, may consider anticoagulation with coumadin. However, due to her advance age and frequent falls, recent fall with rib and clavicle fractures, potential bleeding risk, she may not be a good candidate for anticoagulation. Will hold off unless she was found to have afib for which we may consider DOACs which have lower risk for bleeding.    Hypertension  Home meds:   Coreg and lasix Permissive hypertension (OK if <180/105) for 24-48 hours post stroke and then gradually normalized within 5-7 days. Currently on coreg and lasix  Stable  Patient counseled  to be compliant with her blood pressure medications  Hyperlipidemia  Home meds:  None   LDL 91, goal < 70  Add lipitor 10mg    Continue statin at discharge  Other Stroke Risk Factors  Advanced age  Complete heart block s/p pacer  Other Active Problems  CKD, stage II - Cre 1.25  Elevated troponin - flatened  Other Pertinent History  Mild anemia  leukocytosis  Hospital day # 0   Marvel Plan, MD PhD Stroke Neurology 01/14/2016 10:32 AM    To contact Stroke Continuity provider, please refer to [email or web address]. After hours, contact General Neurology

## 2016-01-15 ENCOUNTER — Other Ambulatory Visit (HOSPITAL_COMMUNITY): Payer: Medicare Other

## 2016-01-15 DIAGNOSIS — Z7189 Other specified counseling: Secondary | ICD-10-CM

## 2016-01-15 DIAGNOSIS — Z515 Encounter for palliative care: Secondary | ICD-10-CM

## 2016-01-15 LAB — CBC
HCT: 34.2 % — ABNORMAL LOW (ref 36.0–46.0)
Hemoglobin: 10.9 g/dL — ABNORMAL LOW (ref 12.0–15.0)
MCH: 29.1 pg (ref 26.0–34.0)
MCHC: 31.9 g/dL (ref 30.0–36.0)
MCV: 91.2 fL (ref 78.0–100.0)
PLATELETS: 324 10*3/uL (ref 150–400)
RBC: 3.75 MIL/uL — AB (ref 3.87–5.11)
RDW: 14.2 % (ref 11.5–15.5)
WBC: 8.2 10*3/uL (ref 4.0–10.5)

## 2016-01-15 LAB — BASIC METABOLIC PANEL
ANION GAP: 8 (ref 5–15)
BUN: 16 mg/dL (ref 6–20)
CO2: 26 mmol/L (ref 22–32)
Calcium: 8.9 mg/dL (ref 8.9–10.3)
Chloride: 101 mmol/L (ref 101–111)
Creatinine, Ser: 1.11 mg/dL — ABNORMAL HIGH (ref 0.44–1.00)
GFR, EST AFRICAN AMERICAN: 48 mL/min — AB (ref >60)
GFR, EST NON AFRICAN AMERICAN: 41 mL/min — AB (ref >60)
Glucose, Bld: 91 mg/dL (ref 65–99)
POTASSIUM: 3.4 mmol/L — AB (ref 3.5–5.1)
SODIUM: 135 mmol/L (ref 135–145)

## 2016-01-15 LAB — CALCIUM, IONIZED: CALCIUM, IONIZED, SERUM: 5 mg/dL (ref 4.5–5.6)

## 2016-01-15 LAB — HEMOGLOBIN A1C
HEMOGLOBIN A1C: 6.2 % — AB (ref 4.8–5.6)
MEAN PLASMA GLUCOSE: 131 mg/dL

## 2016-01-15 IMAGING — CR DG LUMBAR SPINE 2-3V
3 series · 3 of 3 positions shown · non-contrast
Comparison: None.

CLINICAL DATA: Status post fall.

EXAM:
LUMBAR SPINE - 2-3 VIEW

[t l-spine a.p.]
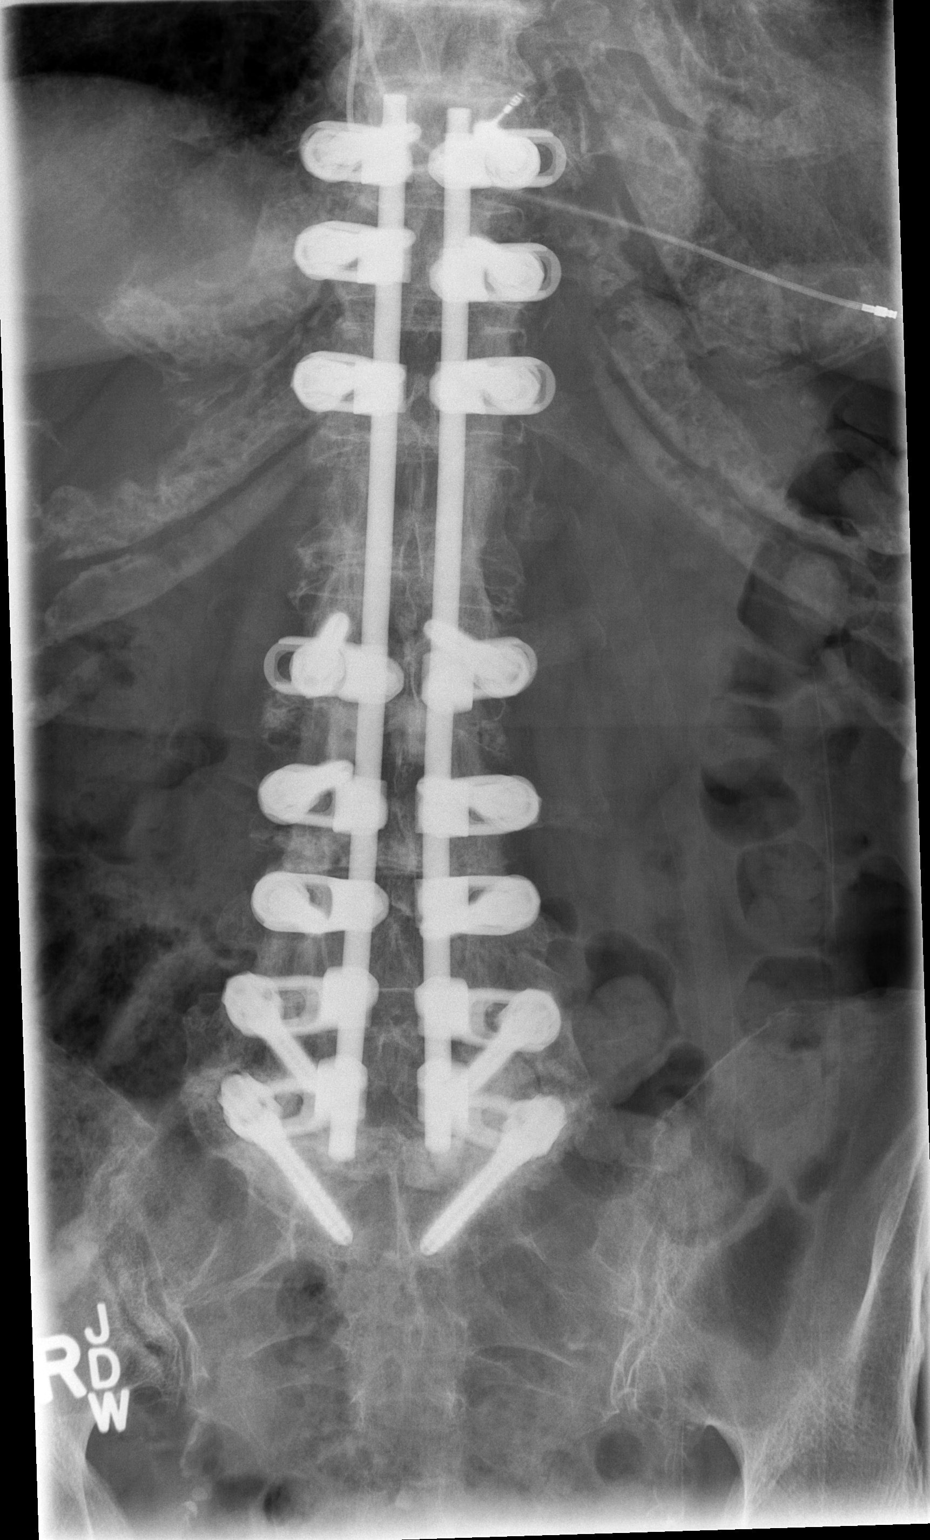

[t l-spine lat]
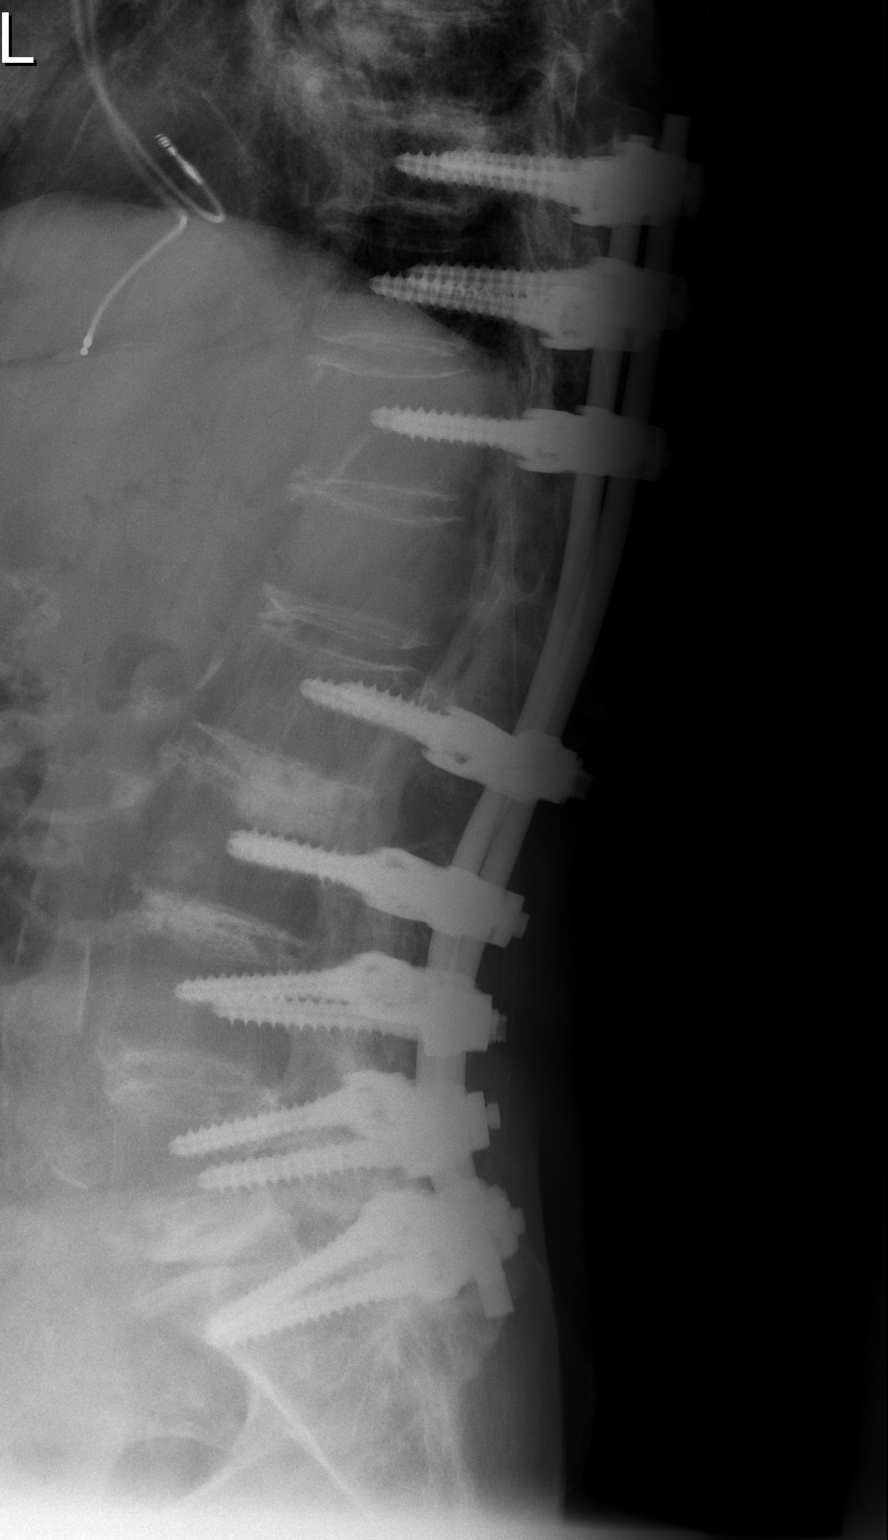

[t l-spine l5-s1 spot]
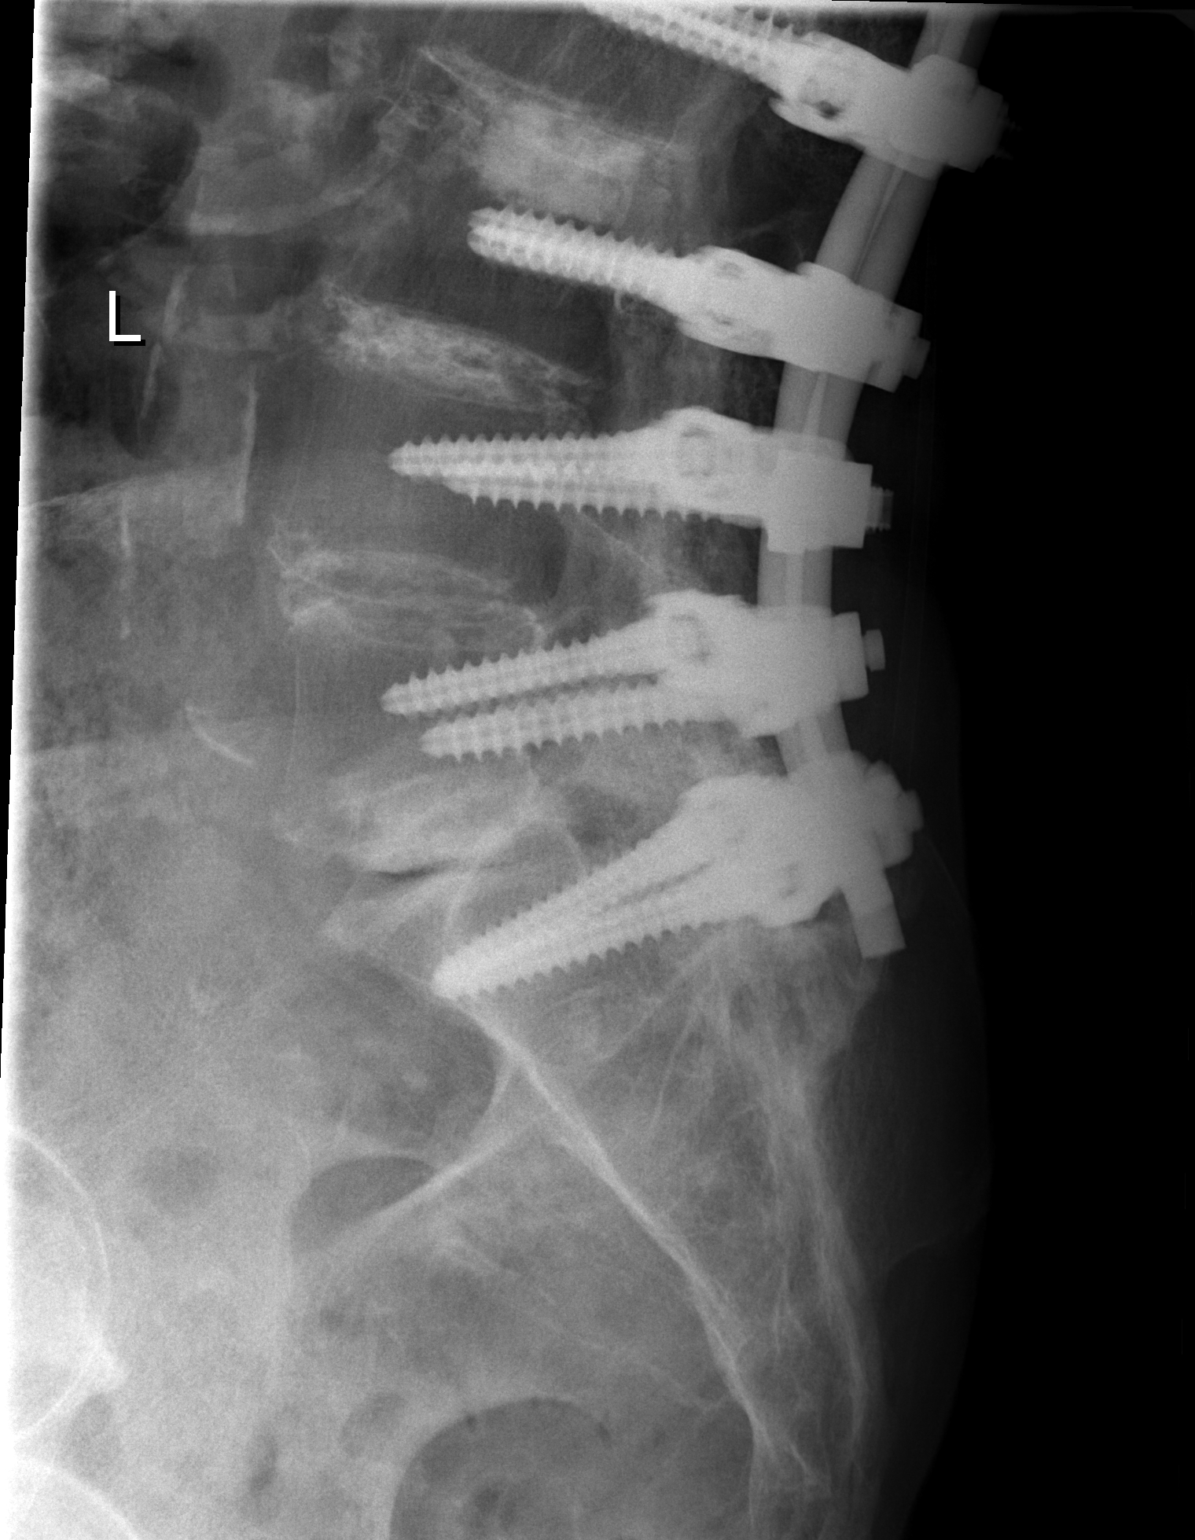

[3 of 3 positions shown; findings below may reference images not displayed]

FINDINGS: The alignment of the lumbar spine is normal. There has been previous
posterior fixation of the thoracic and lumbar spine from T10 through
S1. The vertebral body heights are well preserved. No acute
fractures identified.
IMPRESSION: 1. No acute findings.
2. Prior hardware fixation of the thoracic and lumbar spine.

## 2016-01-15 MED ORDER — MORPHINE SULFATE (PF) 2 MG/ML IV SOLN
0.5000 mg | INTRAVENOUS | Status: DC | PRN
  Administered 2016-01-15: 0.5 mg via INTRAVENOUS
  Filled 2016-01-15: qty 1

## 2016-01-15 MED ORDER — HALOPERIDOL LACTATE 5 MG/ML IJ SOLN: 0.5000 mg | INTRAMUSCULAR | Status: DC | PRN

## 2016-01-15 MED ORDER — ONDANSETRON 4 MG PO TBDP
4.0000 mg | ORAL_TABLET | Freq: Four times a day (QID) | ORAL | Status: DC | PRN
  Filled 2016-01-15: qty 1

## 2016-01-15 MED ORDER — HALOPERIDOL 0.5 MG PO TABS
0.5000 mg | ORAL_TABLET | ORAL | Status: DC | PRN
  Filled 2016-01-15: qty 1

## 2016-01-15 MED ORDER — SODIUM CHLORIDE 0.9% FLUSH
3.0000 mL | Freq: Two times a day (BID) | INTRAVENOUS | Status: DC
  Administered 2016-01-16: 3 mL via INTRAVENOUS

## 2016-01-15 MED ORDER — POLYVINYL ALCOHOL 1.4 % OP SOLN
1.0000 [drp] | Freq: Four times a day (QID) | OPHTHALMIC | Status: DC | PRN
  Filled 2016-01-15: qty 15

## 2016-01-15 MED ORDER — ONDANSETRON HCL 4 MG/2ML IJ SOLN: 4.0000 mg | Freq: Four times a day (QID) | INTRAMUSCULAR | Status: DC | PRN

## 2016-01-15 MED ORDER — HALOPERIDOL LACTATE 2 MG/ML PO CONC
0.5000 mg | ORAL | Status: DC | PRN
  Filled 2016-01-15: qty 0.3

## 2016-01-15 MED ORDER — LORAZEPAM 2 MG/ML IJ SOLN: 1.0000 mg | INTRAMUSCULAR | Status: DC | PRN

## 2016-01-15 MED ORDER — MORPHINE SULFATE (CONCENTRATE) 10 MG/0.5ML PO SOLN
5.0000 mg | Freq: Four times a day (QID) | ORAL | Status: DC
  Administered 2016-01-15 – 2016-01-16 (×3): 5 mg via ORAL
  Filled 2016-01-15 (×4): qty 0.5

## 2016-01-15 MED ORDER — GLYCOPYRROLATE 0.2 MG/ML IJ SOLN
0.2000 mg | INTRAMUSCULAR | Status: DC | PRN
  Filled 2016-01-15: qty 1

## 2016-01-15 MED ORDER — BIOTENE DRY MOUTH MT LIQD: 15.0000 mL | OROMUCOSAL | Status: DC | PRN

## 2016-01-15 MED ORDER — SODIUM CHLORIDE 0.9% FLUSH: 3.0000 mL | INTRAVENOUS | Status: DC | PRN

## 2016-01-15 MED ORDER — LORAZEPAM 2 MG/ML PO CONC: 1.0000 mg | ORAL | Status: DC | PRN

## 2016-01-15 MED ORDER — LORAZEPAM 1 MG PO TABS: 1.0000 mg | ORAL_TABLET | ORAL | Status: DC | PRN

## 2016-01-15 MED ORDER — SODIUM CHLORIDE 0.9 % IV SOLN: 250.0000 mL | INTRAVENOUS | Status: DC | PRN

## 2016-01-15 MED ORDER — ACETAMINOPHEN 325 MG PO TABS: 650.0000 mg | ORAL_TABLET | Freq: Four times a day (QID) | ORAL | Status: DC | PRN

## 2016-01-15 MED ORDER — GLYCOPYRROLATE 1 MG PO TABS
1.0000 mg | ORAL_TABLET | ORAL | Status: DC | PRN
  Filled 2016-01-15: qty 1

## 2016-01-15 MED ORDER — ACETAMINOPHEN 650 MG RE SUPP: 650.0000 mg | Freq: Four times a day (QID) | RECTAL | Status: DC | PRN

## 2016-01-15 MED ORDER — STARCH (THICKENING) PO POWD
ORAL | Status: DC | PRN
  Filled 2016-01-15: qty 227

## 2016-01-15 MED ORDER — FUROSEMIDE 40 MG PO TABS
40.0000 mg | ORAL_TABLET | Freq: Every day | ORAL | Status: DC
  Administered 2016-01-16: 40 mg via ORAL
  Filled 2016-01-15: qty 1

## 2016-01-15 NOTE — Progress Notes (Signed)
STROKE TEAM PROGRESS NOTE   SUBJECTIVE (INTERVAL HISTORY) Her daughter is at the bedside. Palliative NP is also at bedside. Pt is on honey thick liquid and she kept asking for water. Family requested hospice care due to pt poor prognosis and recent dramatic decline with falls. Will d/c further work up.     OBJECTIVE Temp:  [97.9 F (36.6 C)-98.6 F (37 C)] 98.2 F (36.8 C) (10/03 0808) Pulse Rate:  [80-85] 80 (10/03 0808) Cardiac Rhythm: Ventricular paced (10/03 0724) Resp:  [19-22] 19 (10/03 0437) BP: (141-163)/(66-76) 163/72 (10/03 0809) SpO2:  [97 %-100 %] 97 % (10/03 0808) Weight:  [110 lb 8 oz (50.1 kg)] 110 lb 8 oz (50.1 kg) (10/03 0437)  No results for input(s): GLUCAP in the last 168 hours.  Recent Labs Lab 01/13/16 1745 01/13/16 1751 01/14/16 0256 01/15/16 0334  NA 126* 130* 134* 135  K 4.4 4.7 4.0 3.4*  CL 90* 91* 97* 101  CO2 26  --  25 26  GLUCOSE 120* 116* 124* 91  BUN 20 30* 17 16  CREATININE 1.39* 1.40* 1.25* 1.11*  CALCIUM 8.6*  --  9.3 8.9    Recent Labs Lab 01/13/16 1745  AST 20  ALT 13*  ALKPHOS 95  BILITOT 0.7  PROT 7.0  ALBUMIN 3.2*    Recent Labs Lab 01/13/16 1745 01/13/16 1751 01/14/16 0256 01/15/16 0334  WBC 9.7  --  11.4* 8.2  NEUTROABS 6.8  --   --   --   HGB 11.7* 12.6 11.8* 10.9*  HCT 36.3 37.0 36.3 34.2*  MCV 91.0  --  90.5 91.2  PLT 379  --  363 324    Recent Labs Lab 01/13/16 2210 01/14/16 0256 01/14/16 1301  TROPONINI 0.17* 0.15* 0.14*    Recent Labs  01/13/16 1745  LABPROT 14.1  INR 1.08    Recent Labs  01/13/16 1907  COLORURINE YELLOW  LABSPEC <1.005*  PHURINE 6.0  GLUCOSEU NEGATIVE  HGBUR NEGATIVE  BILIRUBINUR NEGATIVE  KETONESUR NEGATIVE  PROTEINUR NEGATIVE  NITRITE NEGATIVE  LEUKOCYTESUR SMALL*       Component Value Date/Time   CHOL 155 01/14/2016 0256   TRIG 73 01/14/2016 0256   HDL 49 01/14/2016 0256   CHOLHDL 3.2 01/14/2016 0256   VLDL 15 01/14/2016 0256   LDLCALC 91 01/14/2016  0256   Lab Results  Component Value Date   HGBA1C 6.2 (H) 01/14/2016      Component Value Date/Time   LABOPIA NONE DETECTED 01/13/2016 1907   COCAINSCRNUR NONE DETECTED 01/13/2016 1907   LABBENZ NONE DETECTED 01/13/2016 1907   AMPHETMU NONE DETECTED 01/13/2016 1907   THCU NONE DETECTED 01/13/2016 1907   LABBARB NONE DETECTED 01/13/2016 1907     Recent Labs Lab 01/13/16 1745  ETH <5    I have personally reviewed the radiological images below and agree with the radiology interpretations.  Dg Chest 2 View 01/14/2016 IMPRESSION: No definite right-sided pneumothorax visible today. Small bilateral pleural effusions. Known right clavicular and right rib fractures. Left basilar atelectasis or pneumonia. No CHF. Aortic atherosclerosis.   Ct Head Code Stroke Wo Contrast` 01/13/2016 IMPRESSION: 1. Suspected high-density thrombus in distal right M2 branches. Small chronic cortical infarct in this distribution but no visible acute right hemispheric infarct. 2. Acute or subacute moderate size left occipital infarct, new from 12/29/2015. 3. ASPECTS is 10 on the right when accounting for chronic changes. 4. No acute hemorrhage.   CTA head and neck 01/14/2016 IMPRESSION: 1. Positive for Right Hemo-pneumothorax,  small to moderate. Multiple displaced right rib fractures; at least ribs two through five. And the fourth and fifth ribs are fractured in two places. 2. Negative for emergent large vessel occlusion. Generalized ectatic arteries in the head and neck with no significant large vessel stenosis despite calcified atherosclerosis. 3. There is Right MCA M3 branch Occlusion suspected. And there is an Evolving Right MCA Infarct at the operculum. No associated hemorrhage or mass effect. 4. Otherwise stable CT appearance of the brain. 5. Emphysema.   2D Echocardiogram limited cancelled  EKG A-V pacing   PHYSICAL EXAM  Temp:  [97.9 F (36.6 C)-98.6 F (37 C)] 98.2 F (36.8 C) (10/03 0808) Pulse  Rate:  [80-85] 80 (10/03 0808) Resp:  [19-22] 19 (10/03 0437) BP: (141-163)/(66-76) 163/72 (10/03 0809) SpO2:  [97 %-100 %] 97 % (10/03 0808) Weight:  [110 lb 8 oz (50.1 kg)] 110 lb 8 oz (50.1 kg) (10/03 0437)  General - Well nourished, well developed, lethargic.  Ophthalmologic - fundi not visualized due to noncooperation.  Cardiovascular - Regular rate and rhythm.  Neuro  - Level of arousal and orientation to year, place, and person were intact, but not orientated to month. Intact on simple sentence expression, following simple commands, intact at naming and repetition but moderate to severe dysarthria. Left nasolabial fold flattening, right gaze preference and now able to cross midline. Right side neglect improved, but still not blinking to visual threat on the left. Anosognosia and autotopagnosia on the left also improved and resolved. Tongue in middle. Left UE flaccid, no movement on pain. LLE no spontaneous movement, but 2/5 on pain stimulation. No babinski and DTR 1+. Sensation, coordination and gait not tested.    ASSESSMENT/PLAN [name] is a 82 y.o. female with history of HTN, CKD, CHF with EF 25-30%, complete heart block s/p pacemaker, HLD and ALF resident admitted for left hemiplegia and slurry speech.     Stroke:  Left occipital subacute infarcts and right MCA infarct, embolic secondary to cardiomyopathy with low EF vs. Undiagnosed afib  MRI / MRA - not able to perform due to pacemaker  CTA head and neck left M3 branch occlusion  CT head repeat - right MCA infarct and left posterior MCA infarct   2D Echo 12/30/15 EF 25-30% down from 45% in 04/2015  Pacemaker interrogation cancelled  LDL 91  HgbA1c 6.2  UDS negative  lovenox for VTE prophylaxis  DIET - DYS 1 Room service appropriate? Yes; Fluid consistency: Honey Thick   aspirin 81 mg daily prior to admission, now on aspirin 81 mg daily. Increased ASA to 325mg  daily. May d/c for hospice  placement  Disposition:  Hospice as per family request  CHF with low EF  EF 25-30% in 12/2015 down from 45% in 04/2015  Home meds: coreg, lasix, resumed on admission  Limited TTE cancelled  Pt not candidate for anticoagulation due to fall risk and advance age  Family requested hospice placement. No more embolic work up needed.   Hypertension  Home meds:   Coreg and lasix Currently on coreg and lasix  Stable  Hyperlipidemia  Home meds:  None   LDL 91, goal < 70  on lipitor 10mg    May d/c for hospice placement  Other Stroke Risk Factors  Advanced age  Complete heart block s/p pacer  Other Active Problems  CKD, stage II - Cre 1.25  Elevated troponin - flatened  Other Pertinent History  Mild anemia  leukocytosis  Hospital day # 1  Neurology will sign off. Please call with questions. Thanks for the consult.   Marvel Plan, MD PhD Stroke Neurology 01/15/2016 9:39 AM    To contact Stroke Continuity provider, please refer to [email or web address]. After hours, contact General Neurology

## 2016-01-15 NOTE — Progress Notes (Signed)
SLP Cancellation Note  Patient Details Name: Bethany Landry MRN: [id number] DOB: [birth year]   Cancelled treatment:       Reason Eval/Treat Not Completed: Other (comment) Per chart review, note that pt/family are now pursuing full comfort care and residential hospice. Spoke with palliative care who says that speech/language evaluation and additional dysphagia tx are no longer warranted. SLP to sign off. Please reorder if needed.   Maxcine Hamaiewonsky, Iman Reinertsen 01/15/2016, 3:35 PM  Maxcine HamLaura Paiewonsky, M.A. CCC-SLP [phone]

## 2016-01-15 NOTE — Progress Notes (Signed)
No charge note.    Spoke with Dr. [name] on the phone - [place] at the airport and delayed.  He agreed to full comfort care.  Stop unnecessary testing and medications.   Will place order for [facility] hosp[facility]  Dtr is in the room with the patient.  Algis DownsMarianne York, [place] JerseyPA-C Palliative Medicine Pager: [phone]

## 2016-01-15 NOTE — Progress Notes (Signed)
Cardiac monitoring discontinued per family request.

## 2016-01-15 NOTE — Consult Note (Signed)
Consultation Note Date: 01/15/2016   Patient Name: Bethany Landry  DOB: 13-Nov-1920  MRN: [id number]  Age / Sex: 82 y.o., female  PCP: Lottie Dawson, MD Referring Physician: Geradine Girt, DO  Reason for Consultation: Establishing goals of care, Hospice Evaluation, Inpatient hospice referral and Terminal Care  HPI/Patient Profile: 82 y.o. female  with past medical history of mixed heart failure, CKD stage 3, complete heart block s/p pace maker placement who was admitted on 01/13/2016 with bilateral CVA. She is from a nursing home.  The staff reported left sided weakness facial droop and slurred speech.  CT angiogram shows Rt MCA M3 branch occlusion, right lung hemopneumothorax, and multiple displaced right rib fractures.  She was placed on D1 honey thick by speech, but appears to be aspirating on even small bites of ice cream.  Clinical Assessment and Goals of Care: The patient has 6 children.  Her son, Ahrianna Siglin, MD, is her HCPOA.  This morning I met at bedside with Tresea Mall her dtr. Bethena Roys is in touch with the rest of the family and they concur they want her to be out of pain.  Other decisions she defers to her brother Marya Amsler who will be here this afternoon.  She believes the family will choose residential hospice.  Dr. Erlinda Hong of Neurology rounded on the patient during my visit.  Bethena Roys requested that he defer all other procedures for now in anticipation of a decision to go comfort care to be made this afternoon.  I spoke with Dr. Sherren Kerns on the phone his plane was delayed and he is still in [place].  He wants his mother to be made comfortable and he would like to start the process of moving her to residential hospice  - specifically [facility].  We discussed medications being de-prescribed and transitioning to comfort measures.  He is fully supportive of comfort measures only.  HCPOA:  Sherren Kerns, MD    SUMMARY OF RECOMMENDATIONS   Code Status/Advance Care Planning:  DNR   Symptom Management:   Scheduled roxanol q 6 hours and morphine IV PRN for pain.Marland Kitchen  PRN Ativan for anxity  PRN Robinul for secretions   Palliative Prophylaxis:   Aspiration, Eye Care, Frequent Pain Assessment and Oral Care  Additional Recommendations (Limitations, Scope, Preferences):  Minimize Medications   I will discontinue tele, weights, strict I &Os and neuro checks in an attempt to minimize discomfort to the patient.  Psycho-social/Spiritual:   Desire for further Chaplaincy support:yes  Additional Recommendations: Caregiving  Support/Resources  Prognosis:   < 2 weeks  Discharge Planning: Hospice facility      Primary Diagnoses: Present on Admission: . Acute ischemic stroke ([place]) . Cardiac pacemaker . Chronic hyponatremia . Dysarthria . Closed right clavicular fracture . CKD (chronic kidney disease), stage III . Chronic systolic CHF (congestive heart failure) ([place]) . Essential hypertension . Hypothyroidism . CVA (cerebral vascular accident) ([place])   I have reviewed the medical record, interviewed the patient and family, and examined the patient. The following aspects  are pertinent.  Past Medical History:  Diagnosis Date  . Benign hypertension   . CKD (chronic kidney disease), stage III    moderate  . Complete heart block ([place])   . DOE (dyspnea on exertion)   . Fungal infection    recurrent  . Hyperlipidemia   . Hypothyroidism   . Insomnia   . Mastoiditis    hearing loss, s/p mastoiectomy, right ear  . Osteoarthritis   . Osteoporosis   . Sinoatrial node dysfunction ([facility])    Social History   Social History  . Marital status: Divorced    Spouse name: N/A  . Number of children: N/A  . Years of education: N/A   Social History Main Topics  . Smoking status: Former Smoker    Types: Cigarettes    Quit date: 04/14/1982  . Smokeless tobacco: Never Used  . Alcohol use  Yes     Comment: wine, occasionally  . Drug use: No  . Sexual activity: Not on file   Other Topics Concern  . Not on file   Social History Narrative  . No narrative on file   Family History  Problem Relation Age of Onset  . CAD Brother   . Cerebral aneurysm Brother   . Heart attack Mother    Scheduled Meds: .  stroke: mapping our early stages of recovery book   Does not apply Once  . aspirin EC  325 mg Oral Daily  . aspirin  300 mg Rectal Daily  . atorvastatin  10 mg Oral q1800  . carvedilol  3.125 mg Oral BID WC  . furosemide  40 mg Oral BID  . levothyroxine  125 mcg Oral QAC breakfast  . morphine CONCENTRATE  5 mg Oral Q6H  . neomycin-bacitracin-polymyxin  1 application Topical Daily  . RESOURCE THICKENUP CLEAR   Oral Once   Continuous Infusions:  PRN Meds:.acetaminophen, feeding supplement (ENSURE ENLIVE), food thickener, loperamide, morphine injection, senna-docusate Medications Prior to Admission:  Prior to Admission medications   Medication Sig Start Date End Date Taking? Authorizing Provider  acetaminophen (TYLENOL) 500 MG tablet Take 500 mg by mouth 3 (three) times daily as needed (pain).    Yes Historical Provider, MD  aspirin 81 MG chewable tablet Chew 1 tablet (81 mg total) by mouth daily. 10/26/15  Yes Nishant Dhungel, MD  carvedilol (COREG) 3.125 MG tablet Take 1 tablet (3.125 mg total) by mouth 2 (two) times daily with a meal. 10/26/15  Yes Nishant Dhungel, MD  ENSURE PLUS (ENSURE PLUS) LIQD Take 237 mLs by mouth 2 (two) times daily as needed (for lack of appetite). Chocolate per MAR   Yes Historical Provider, MD  furosemide (LASIX) 40 MG tablet Take 1 tablet (40 mg total) by mouth daily. Patient taking differently: Take 40 mg by mouth 2 (two) times daily.  12/31/15  Yes Reyne Dumas, MD  ketorolac (TORADOL) 10 MG tablet Take 10 mg by mouth See admin instructions. Order date 01/07/16: take 1 tablet (10 mg) by mouth daily for 3 weeks (stop date 01/28/16)   Yes  Historical Provider, MD  levothyroxine (SYNTHROID, LEVOTHROID) 125 MCG tablet Take 125 mcg by mouth daily before breakfast.   Yes Historical Provider, MD  loperamide (IMODIUM) 2 MG capsule Take 2-4 mg by mouth See admin instructions. Take 2 capsules (4 mg) by mouth after first loose stool (do not crush); take 1 capsule (2 mg) after each additional loose stool as needed (Do not exceed 4 capsules in 24 hours)  Yes Historical Provider, MD  neomycin-bacitracin-polymyxin (NEOSPORIN) OINT Apply 1 application topically See admin instructions. Clean right side of head with soap and water, pat to dry, apply triple antibiotic ointment once daily until healed, then stop.   Yes Historical Provider, MD  NON FORMULARY Take 163 mLs by mouth See admin instructions. Drink 5.5 oz (163 mls) prune juice daily. Hold when having stools.   Yes Historical Provider, MD  senna-docusate (SENOKOT-S) 8.6-50 MG tablet Take 1 tablet by mouth at bedtime as needed for mild constipation. 12/31/15  Yes Reyne Dumas, MD  oxyCODONE (OXY IR/ROXICODONE) 5 MG immediate release tablet Take 0.5 tablets (2.5 mg total) by mouth every 6 (six) hours as needed for moderate pain. Patient not taking: Reported on 01/13/2016 12/31/15   Reyne Dumas, MD  traMADol (ULTRAM) 50 MG tablet Take 2 tablets (100 mg total) by mouth every 12 (twelve) hours as needed for moderate pain. Patient not taking: Reported on 01/13/2016 12/31/15   Reyne Dumas, MD   Allergies  Allergen Reactions  . Norvasc [Amlodipine Besylate] Swelling    Swelling in ankles  . Quinapril Hcl Swelling  . Ace Inhibitors Swelling and Cough   Review of Systems:  Patient denies pain (but her expression looks pained).  She requests water.  She denies constipation.  Unable to give a full review of systems  Physical Exam  Elderly frail, awake, alert, requesting water.  Patient coughs on small bites of magic cup. CV rrr resp NAD Abdomen thin, non distended. Extremities:  LUE flaccid, LLE  very weak  Vital Signs: BP (!) 163/72   Pulse 80   Temp 98.2 F (36.8 C) (Oral)   Resp 19   Ht '5\' 3"'$  (1.6 m)   Wt 50.1 kg (110 lb 8 oz)   SpO2 97%   BMI 19.57 kg/m  Pain Assessment: PAINAD   Pain Score: 0-No pain   SpO2: SpO2: 97 % O2 Device:SpO2: 97 % O2 Flow Rate: .   IO: Intake/output summary:  Intake/Output Summary (Last 24 hours) at 01/15/16 1011 Last data filed at 01/15/16 0906  Gross per 24 hour  Intake              180 ml  Output                0 ml  Net              180 ml    LBM:   Baseline Weight: Weight: 51.8 kg (114 lb 1.6 oz) Most recent weight: Weight: 50.1 kg (110 lb 8 oz)     Palliative Assessment/Data:   Flowsheet Rows   Flowsheet Row Most Recent Value  Intake Tab  Referral Department  Hospitalist  Unit at Time of Referral  Cardiac/Telemetry Unit  Palliative Care Primary Diagnosis  Neurology  Date Notified  01/14/16  Palliative Care Type  New Palliative care  Reason for referral  Pain, Clarify Goals of Care, End of Life Care Assistance, Counsel Regarding Hospice  Date of Admission  01/13/16  Date first seen by Palliative Care  01/14/16  # of days Palliative referral response time  0 Day(s)  # of days IP prior to Palliative referral  1  Clinical Assessment  Palliative Performance Scale Score  30%  Psychosocial & Spiritual Assessment  Palliative Care Outcomes  Patient/Family meeting held?  Yes  Who was at the meeting?  Dtr Bethena Roys and Patient, later in day will meet with [facility])  Palliative Care Outcomes  Improved pain interventions  Time In: 9:00 Time Out: 10:10 Time Total: 70 min. Greater than 50%  of this time was spent counseling and coordinating care related to the above assessment and plan.  Signed by: Melton Alar, PA-C   Please contact Palliative Medicine Team phone at [phone] for questions and concerns.  For individual provider: See Shea Evans

## 2016-01-15 NOTE — Progress Notes (Signed)
Nutrition Brief Note  Chart reviewed due to low Braden score. Patient now transitioning to comfort care with plans for residential Hospice.  No nutrition interventions warranted at this time.  Please consult as needed.   Joaquin CourtsKimberly Harris, RD, LDN, CNSC Pager [phone] After Hours Pager [phone]

## 2016-01-15 NOTE — Clinical Social Work Note (Signed)
Clinical Social Work Assessment  Patient Details  Name: Bethany Landry MRN: [id number] Date of Birth: 01/20/1921  Date of referral:  01/15/16               Reason for consult:  Facility Placement, End of Life/Hospice                Permission sought to share information with:  Family Supports Permission granted to share information::  Yes, Verbal Permission Granted  Name::     Haneefah Venturini / Sherren Kerns  Relationship::  Daughter / Glenetta Borg Information:  [phone] / [phone]  Housing/Transportation Living arrangements for the past 2 months:  [place] of Information:  Adult Children Patient Interpreter Needed:  None Criminal Activity/Legal Involvement Pertinent to Current Situation/Hospitalization:  No - Comment as needed Significant Relationships:  Adult Children, Other Family Members Lives with:  Facility Resident Do you feel safe going back to the place where you live?  Yes Need for family participation in patient care:  Yes (Comment)  Care giving concerns:  Patient daughter at bedside and does not express concern at this time.  Patient son currently delayed on a flight for return home.  Patient daughter is only aware of [facility] and does not express interest in any other facility at this time.  Social Worker assessment / plan: Holiday representative met with patient daughter at bedside to offer support and discuss patient needs at discharge.  Patient daughter states that patient was living at [facility] prior to admission.  Patient family has opted for full comfort care and would like to pursue placement at [facility].  CSW made referral to [facility] and will follow up tomorrow regarding bed availability.  CSW remains available for support and to facilitate patient discharge needs once medically stable and bed available.   Employment status:  Retired Forensic scientist:  Medicare PT Recommendations:   ([place]) Information / Referral to community resources:  Other (Comment Required) (Residential Hospice)  Patient/Family's Response to care:  Patient family verbalized understanding of CSW role and appreciation for support and concern.  Patient daughter is hopeful for placement at [facility] and for patient to find peace and comfort.  Patient/Family's Understanding of and Emotional Response to Diagnosis, Current Treatment, and Prognosis:  Patient family with medical professionals and very understanding of patient current status and prognosis.  Patient family understanding and agreeable to full comfort care measures moving forward.  Emotional Assessment Appearance:  Appears stated age Attitude/Demeanor/Rapport:  Unable to Assess Affect (typically observed):  Unable to Assess Orientation:  Oriented to Self Alcohol / Substance use:  Not Applicable Psych involvement (Current and /or in the community):  No (Comment)  Discharge Needs  Concerns to be addressed:  Discharge Planning Concerns Readmission within the last 30 days:  Yes Current discharge risk:  Terminally ill Barriers to Discharge:  Continued Medical Work up, Hospice Bed not available   [employer], [place]

## 2016-01-15 NOTE — Progress Notes (Signed)
PROGRESS NOTE    Bethany Landry  ZOX:[id number] DOB: [birth year] DOA: 01/13/2016 PCP: Clelia Schaumann, MD   Outpatient Specialists:     Brief Narrative:  Bethany Landry is a 82 y.o. female with medical history significant of sys & dys CHF EF 25-30%, CKD stage III, hypothyroidism, and s/p PM for complete heart block; who presents for sudden onset of left-sided weakness. History is obtained by review of reports, the patient, and discussions with the patient's son [name]. Nursing home staff noted symptoms around 4:15 pm. Associated symptoms included left-sided facial droop with slurred speech. EMS was immediately called.   Note the patient had just been discharged from the hospital hospitalized at [facility] from 9/16 - 9/18 after having a fall suffering rib and clavicle fracture. Patient had positive orthostatic vital signs at that time, an elevated troponin thought secondary to chronic heart failure, and was possibly thought to have had an arrhythmia, but pacemaker interrogation was normal.  Patient notes that all week she has felt somewhat short of breath with intermittent wheezing. Otherwise she also complains of right shoulder pain with movement and has been using Tylenol to treat symptoms with moderate relief. She currently denies any significant weakness per se. At baseline she states that she gets around with a rolling walker. Patient's son, who was previously an anesthesiologist here at [facility], notes that they had previously backed off of her Lasix dosage during her last hospitalization in 9/16, but she had been having worsening shortness of breath. He advised that they increase her back to the 40 mg twice daily, which she has been taking over the last 1 week. He reports verifying this change was okay with Dr. Zoila Shutter the patient's cardiologist. Discussed with him regarding her recent lab work which he note appears to be at her baseline. He also notes that after her last  hospitalization she did not tolerate the right armsling and told staff to take it off. She followed up with orthopedics and they were okay with this.    Assessment & Plan:   Principal Problem:   Acute ischemic stroke [facility]) Active Problems:   Essential hypertension   Cardiac pacemaker   CKD (chronic kidney disease), stage III   Chronic hyponatremia   Chronic systolic CHF (congestive heart failure) (HCC)   Hypothyroidism   Closed right clavicular fracture   Dysarthria   CVA (cerebral vascular accident) [facility])   Palliative care encounter   Encounter for hospice care discussion   Goals of care, counseling/discussion   Acute ischemic stroke with left hemineglect, left facial droop, and dysarthria:  -now comfort care  Right hemo- Pneumothorax - Continuous pulse oximetry with nasal cannula oxygen to keep O2 sats greater than 92% - not seen on repeat study -discussed with son and then pulm--- patient would not want invasive procedures  Elevated troponin: Similar to previous admission at 0.16 this appears to be the patient's baseline the result of likely CKD and CHF.   Combined congestive heart failure/ status post PM for complete heart block: Now comfort focus   Chronic kidney disease stage III:  Patient's baseline creatinine ranges from 0.9 to 1.4 - comfort focus  Essential hypertension - comfort focus  Fractured clavicle and shoulder: Occurred on 9/16. Patient did not tolerate sling. - Continue Tylenol prn- does not tolerate pain meds with out hallucinations  Hypothyroidism - Continue Levothyroxine  Hyponatremia: Acute on chronic. sodium 126 on admission. Son states that baseline sodium is somewhere around 124. secondary to diuretic -  Continue to monitor   Hypocalcemia: Ionized calcium 1.03   Now comfort focus   DVT prophylaxis:  SCD's  Code Status: DNR   Family Communication: Daughter at bedside  Disposition Plan:     Consultants:    Neuro  Palliative care    Subjective: Sleepy- minimally responsive  Objective: Vitals:   01/15/16 0437 01/15/16 0808 01/15/16 0809 01/15/16 1201  BP: (!) 160/74  (!) 163/72   Pulse: 85 80  73  Resp: 19     Temp: 98 F (36.7 C) 98.2 F (36.8 C)  98.1 F (36.7 C)  TempSrc: Oral Oral  Oral  SpO2: 98% 97%  96%  Weight: 50.1 kg (110 lb 8 oz)     Height:        Intake/Output Summary (Last 24 hours) at 01/15/16 1636 Last data filed at 01/15/16 0906  Gross per 24 hour  Intake               60 ml  Output                0 ml  Net               60 ml   Filed Weights   01/13/16 2314 01/14/16 0500 01/15/16 0437  Weight: 51.8 kg (114 lb 1.6 oz) 51.9 kg (114 lb 8 oz) 50.1 kg (110 lb 8 oz)    Examination:  General exam: speech slurred Respiratory system: Clear to auscultation. Respiratory effort normal. Cardiovascular system: S1 & S2 heard, RRR. No JVD, murmurs, rubs, gallops or clicks. No pedal edema. Gastrointestinal system: Abdomen is nondistended, soft and nontender. No organomegaly or masses felt. Normal bowel sounds heard. Central nervous system: Alert unable to cooperate with neuro exam    Data Reviewed: I have personally reviewed following labs and imaging studies  CBC:  Recent Labs Lab 01/13/16 1745 01/13/16 1751 01/14/16 0256 01/15/16 0334  WBC 9.7  --  11.4* 8.2  NEUTROABS 6.8  --   --   --   HGB 11.7* 12.6 11.8* 10.9*  HCT 36.3 37.0 36.3 34.2*  MCV 91.0  --  90.5 91.2  PLT 379  --  363 324   Basic Metabolic Panel:  Recent Labs Lab 01/13/16 1745 01/13/16 1751 01/14/16 0256 01/15/16 0334  NA 126* 130* 134* 135  K 4.4 4.7 4.0 3.4*  CL 90* 91* 97* 101  CO2 26  --  25 26  GLUCOSE 120* 116* 124* 91  BUN 20 30* 17 16  CREATININE 1.39* 1.40* 1.25* 1.11*  CALCIUM 8.6*  --  9.3 8.9   GFR: Estimated Creatinine Clearance: 25 mL/min (by C-G formula based on SCr of 1.11 mg/dL (H)). Liver Function Tests:  Recent Labs Lab 01/13/16 1745  AST 20   ALT 13*  ALKPHOS 95  BILITOT 0.7  PROT 7.0  ALBUMIN 3.2*   No results for input(s): LIPASE, AMYLASE in the last 168 hours. No results for input(s): AMMONIA in the last 168 hours. Coagulation Profile:  Recent Labs Lab 01/13/16 1745  INR 1.08   Cardiac Enzymes:  Recent Labs Lab 01/13/16 2210 01/14/16 0256 01/14/16 1301  TROPONINI 0.17* 0.15* 0.14*   BNP (last 3 results) No results for input(s): PROBNP in the last 8760 hours. HbA1C:  Recent Labs  01/14/16 0256  HGBA1C 6.2*   CBG: No results for input(s): GLUCAP in the last 168 hours. Lipid Profile:  Recent Labs  01/14/16 0256  CHOL 155  HDL 49  LDLCALC 91  TRIG 73  CHOLHDL 3.2   Thyroid Function Tests: No results for input(s): TSH, T4TOTAL, FREET4, T3FREE, THYROIDAB in the last 72 hours. Anemia Panel: No results for input(s): VITAMINB12, FOLATE, FERRITIN, TIBC, IRON, RETICCTPCT in the last 72 hours. Urine analysis:    Component Value Date/Time   COLORURINE YELLOW 01/13/2016 1907   APPEARANCEUR HAZY (A) 01/13/2016 1907   LABSPEC <1.005 (L) 01/13/2016 1907   PHURINE 6.0 01/13/2016 1907   GLUCOSEU NEGATIVE 01/13/2016 1907   HGBUR NEGATIVE 01/13/2016 1907   BILIRUBINUR NEGATIVE 01/13/2016 1907   KETONESUR NEGATIVE 01/13/2016 1907   PROTEINUR NEGATIVE 01/13/2016 1907   UROBILINOGEN 0.2 10/12/2013 2057   NITRITE NEGATIVE 01/13/2016 1907   LEUKOCYTESUR SMALL (A) 01/13/2016 1907     ) Recent Results (from the past 240 hour(s))  MRSA PCR Screening     Status: None   Collection Time: 01/13/16 11:32 PM  Result Value Ref Range Status   MRSA by PCR NEGATIVE NEGATIVE Final    Comment:        The GeneXpert MRSA Assay (FDA approved for NASAL specimens only), is one component of a comprehensive MRSA colonization surveillance program. It is not intended to diagnose MRSA infection nor to guide or monitor treatment for MRSA infections.       Anti-infectives    None       Radiology Studies: Ct  Angio Head W Or Wo Contrast  Result Date: 01/14/2016 CLINICAL DATA:  82 year old female with left side weakness. Possible hyperdense right MCA branches on noncontrast head CT yesterday. Initial encounter. EXAM: CT ANGIOGRAPHY HEAD AND NECK TECHNIQUE: Multidetector CT imaging of the head and neck was performed using the standard protocol during bolus administration of intravenous contrast. Multiplanar CT image reconstructions and MIPs were obtained to evaluate the vascular anatomy. Carotid stenosis measurements (when applicable) are obtained utilizing NASCET criteria, using the distal internal carotid diameter as the denominator. CONTRAST:  50 mL Isovue 370 COMPARISON:  Noncontrast head CT 01/13/2016 and earlier. Cervical spine CT 12/29/2015. FINDINGS: CTA NECK Skeleton: Stable cervical spine from the recent noncontrast CT. Osteopenia. Mild T2 superior endplate deformity appears stable. Displaced fractures of the right lateral second through fifth ribs. Age indeterminate fracture of the right posterior fourth rib. A posterior fifth rib fracture appears to be either acute or subacute. There is a chronic appearing right posterior lateral sixth rib fracture. Other visualized osseous structures appear intact. Visualized paranasal sinuses and mastoids are stable and well pneumatized. Upper chest: Positive for right lung hydro pneumothorax. Small to moderate component of right lung pleural air (series 8, image 185). Small to moderate component of layering intermediate density fluid which probably is blood. Superimposed centrilobular emphysema. Left chest cardiac pacemaker. No superior mediastinal lymphadenopathy. Small volume retained secretions in the trachea. Other neck: Diminutive thyroid. Negative larynx, pharynx, parapharyngeal spaces, retropharyngeal space, sublingual space, submandibular glands and parotid glands. No cervical lymphadenopathy. Aortic arch: 3 vessel arch configuration. Calcified arch  atherosclerosis, but capacious arch and great vessel origins. Right carotid system: Widely patent right cervical carotid. Mild to moderate calcified plaque at the right carotid bifurcation without stenosis. Left carotid system: Widely patent left cervical carotid. Mild to moderate calcified plaque at the left ICA origin and bulb without stenosis. Vertebral arteries: No proximal right subclavian artery stenosis with mild calcified plaque. Normal right vertebral artery origin. Tortuous right V2 segment but no right vertebral artery stenosis to the skullbase. No proximal left subclavian artery stenosis despite calcified plaque. Normal left vertebral artery origin. Tortuous left  V2 segment. No left vertebral artery stenosis to the skullbase. CTA HEAD Posterior circulation: Patent PICA origins. There is mild irregularity and stenosis of both distal vertebral arteries just distal to the PICA origins. There is mild calcified plaque in the left V4 segment which itself does not result in stenosis. Patent vertebrobasilar junction. Widely patent basilar artery without stenosis. SCA and PCA origins are normal. Posterior communicating arteries are diminutive or absent. Mild left PCA irregularity. No significant PCA stenosis. Anterior circulation: Both ICA siphons are patent. There is calcified plaque affecting both siphons, up to moderate in slightly greater on the right, but no significant stenosis. Both carotid termini are patent. Both MCA and ACA origins are normal. The right A1 is somewhat dominant. The anterior communicating artery and bilateral ACA branches are normal. Left MCA M1 segment, bifurcation, and left MCA branches are within normal limits. Right MCA M1 segment and bifurcation are patent and appear normal. No right MCA M2 branch occlusion is identified. In the middle sylvian M2 branch there is mild irregularity and stenosis demonstrated on series 605, image 6. There is preserved distal flow into downstream M3  branches. However, there is a paucity of middle sylvian M3 branches compared to the left side best seen on series 606, image 60. Venous sinuses: Patent. Anatomic variants: Dominant right A1 segment. Delayed phase: Cytotoxic edema has developed at the right operculum best seen on series 8 images 18 through 20. No associated hemorrhage or mass effect. No ventriculomegaly. Gray-white matter differentiation elsewhere appears stable. No abnormal enhancement identified. Review of the MIP images confirms the above findings IMPRESSION: 1. Positive for Right Hemo-pneumothorax, small to moderate. Multiple displaced right rib fractures; at least ribs two through five. And the fourth and fifth ribs are fractured in two places. 2. Negative for emergent large vessel occlusion. Generalized ectatic arteries in the head and neck with no significant large vessel stenosis despite calcified atherosclerosis. 3. There is Right MCA M3 branch Occlusion suspected. And there is an Evolving Right MCA Infarct at the operculum. No associated hemorrhage or mass effect. 4. Otherwise stable CT appearance of the brain. 5. Emphysema. 6. Preliminary report of the above was discussed by telephone with Dr. Marvel Plan on 01/14/2016 at 1157 hours. Electronically Signed   By: Odessa Fleming M.D.   On: 01/14/2016 12:21   Dg Chest 2 View  Result Date: 01/14/2016 CLINICAL DATA:  Right pneumothorax, CHF, chronic renal insufficiency, right clavicular and right rib fractures, acute CVA. EXAM: CHEST  2 VIEW COMPARISON:  Portable chest x-ray of January 13, 2016 FINDINGS: The right-sided pneumothorax is not clearly evident today. Multiple right posterior and lateral rib fractures as well as distal right clavicular fractures are noted. A trace of pleural fluid is likely present on the right and small effusion on the left as well. Retrocardiac atelectasis on the left is stable. The heart is mildly enlarged. The pulmonary vascularity is not engorged. The permanent  pacemaker is in stable position. There is calcification in the wall of the thoracic aorta. IMPRESSION: No definite right-sided pneumothorax visible today. Small bilateral pleural effusions. Known right clavicular and right rib fractures. Left basilar atelectasis or pneumonia. No CHF. Aortic atherosclerosis. Electronically Signed   By: David  [place] M.D.   On: 01/14/2016 07:24   Ct Angio Neck W Or Wo Contrast  Result Date: 01/14/2016 CLINICAL DATA:  82 year old female with left side weakness. Possible hyperdense right MCA branches on noncontrast head CT yesterday. Initial encounter. EXAM: CT ANGIOGRAPHY HEAD AND NECK TECHNIQUE:  Multidetector CT imaging of the head and neck was performed using the standard protocol during bolus administration of intravenous contrast. Multiplanar CT image reconstructions and MIPs were obtained to evaluate the vascular anatomy. Carotid stenosis measurements (when applicable) are obtained utilizing NASCET criteria, using the distal internal carotid diameter as the denominator. CONTRAST:  50 mL Isovue 370 COMPARISON:  Noncontrast head CT 01/13/2016 and earlier. Cervical spine CT 12/29/2015. FINDINGS: CTA NECK Skeleton: Stable cervical spine from the recent noncontrast CT. Osteopenia. Mild T2 superior endplate deformity appears stable. Displaced fractures of the right lateral second through fifth ribs. Age indeterminate fracture of the right posterior fourth rib. A posterior fifth rib fracture appears to be either acute or subacute. There is a chronic appearing right posterior lateral sixth rib fracture. Other visualized osseous structures appear intact. Visualized paranasal sinuses and mastoids are stable and well pneumatized. Upper chest: Positive for right lung hydro pneumothorax. Small to moderate component of right lung pleural air (series 8, image 185). Small to moderate component of layering intermediate density fluid which probably is blood. Superimposed centrilobular  emphysema. Left chest cardiac pacemaker. No superior mediastinal lymphadenopathy. Small volume retained secretions in the trachea. Other neck: Diminutive thyroid. Negative larynx, pharynx, parapharyngeal spaces, retropharyngeal space, sublingual space, submandibular glands and parotid glands. No cervical lymphadenopathy. Aortic arch: 3 vessel arch configuration. Calcified arch atherosclerosis, but capacious arch and great vessel origins. Right carotid system: Widely patent right cervical carotid. Mild to moderate calcified plaque at the right carotid bifurcation without stenosis. Left carotid system: Widely patent left cervical carotid. Mild to moderate calcified plaque at the left ICA origin and bulb without stenosis. Vertebral arteries: No proximal right subclavian artery stenosis with mild calcified plaque. Normal right vertebral artery origin. Tortuous right V2 segment but no right vertebral artery stenosis to the skullbase. No proximal left subclavian artery stenosis despite calcified plaque. Normal left vertebral artery origin. Tortuous left V2 segment. No left vertebral artery stenosis to the skullbase. CTA HEAD Posterior circulation: Patent PICA origins. There is mild irregularity and stenosis of both distal vertebral arteries just distal to the PICA origins. There is mild calcified plaque in the left V4 segment which itself does not result in stenosis. Patent vertebrobasilar junction. Widely patent basilar artery without stenosis. SCA and PCA origins are normal. Posterior communicating arteries are diminutive or absent. Mild left PCA irregularity. No significant PCA stenosis. Anterior circulation: Both ICA siphons are patent. There is calcified plaque affecting both siphons, up to moderate in slightly greater on the right, but no significant stenosis. Both carotid termini are patent. Both MCA and ACA origins are normal. The right A1 is somewhat dominant. The anterior communicating artery and bilateral ACA  branches are normal. Left MCA M1 segment, bifurcation, and left MCA branches are within normal limits. Right MCA M1 segment and bifurcation are patent and appear normal. No right MCA M2 branch occlusion is identified. In the middle sylvian M2 branch there is mild irregularity and stenosis demonstrated on series 605, image 6. There is preserved distal flow into downstream M3 branches. However, there is a paucity of middle sylvian M3 branches compared to the left side best seen on series 606, image 60. Venous sinuses: Patent. Anatomic variants: Dominant right A1 segment. Delayed phase: Cytotoxic edema has developed at the right operculum best seen on series 8 images 18 through 20. No associated hemorrhage or mass effect. No ventriculomegaly. Gray-white matter differentiation elsewhere appears stable. No abnormal enhancement identified. Review of the MIP images confirms the above findings IMPRESSION:  1. Positive for Right Hemo-pneumothorax, small to moderate. Multiple displaced right rib fractures; at least ribs two through five. And the fourth and fifth ribs are fractured in two places. 2. Negative for emergent large vessel occlusion. Generalized ectatic arteries in the head and neck with no significant large vessel stenosis despite calcified atherosclerosis. 3. There is Right MCA M3 branch Occlusion suspected. And there is an Evolving Right MCA Infarct at the operculum. No associated hemorrhage or mass effect. 4. Otherwise stable CT appearance of the brain. 5. Emphysema. 6. Preliminary report of the above was discussed by telephone with Dr. Marvel Plan on 01/14/2016 at 1157 hours. Electronically Signed   By: Odessa Fleming M.D.   On: 01/14/2016 12:21   Dg Chest Portable 1 View  Result Date: 01/13/2016 CLINICAL DATA:  Left-sided weakness. EXAM: PORTABLE CHEST 1 VIEW COMPARISON:  Radiographs of December 30, 2015. FINDINGS: Stable cardiomediastinal silhouette. Atherosclerosis of thoracic aorta is noted. Left-sided pacemaker  is unchanged in position. Status post posterior fixation of lower thoracic and lumbar spine. Combination of old and new right rib fractures are noted. Mild right apical and basilar pneumothorax is noted. Left lung is clear. IMPRESSION: Aortic atherosclerosis. Stable right rib fractures are noted. Interval development of mild right apical and basilar pneumothorax. Critical Value/emergent results were called by telephone at the time of interpretation on 01/13/2016 at 7:39 pm to Dr. Alona Bene , who verbally acknowledged these results. Electronically Signed   By: Lupita Raider, M.D.   On: 01/13/2016 19:38   Ct Head Code Stroke Wo Contrast`  Addendum Date: 01/13/2016   ADDENDUM REPORT: 01/13/2016 18:46 ADDENDUM: Small bilateral cerebellar infarcts were present 12/29/2015, new when compared to 05/04/2015. Electronically Signed   By: Marnee Spring M.D.   On: 01/13/2016 18:46   Result Date: 01/13/2016 CLINICAL DATA:  Code stroke.  Left-sided weakness. EXAM: CT HEAD WITHOUT CONTRAST TECHNIQUE: Contiguous axial images were obtained from the base of the skull through the vertex without intravenous contrast. COMPARISON:  12/29/2015 FINDINGS: Brain: Interval moderate-sized infarct involving the left occipital lobe, nonhemorrhagic. Suspicious new density of a distal right M2 branch, 201:17 and 18. Small chronic posterior right frontal cortex infarct, best seen on sagittal reformats, stable. No acute hemorrhage, hydrocephalus, or mass. Vascular: Concern for right MCA branch thrombus, as above. Skull: No acute finding Sinuses/Orbits: Negative Other: Results paged at the time of interpretation on 01/13/2016 at 6:01 pm to Dr. Roxy Manns, who verbally acknowledged these results. ASPECTS (Alberta Stroke Program Early CT Score) - Ganglionic level infarction (caudate, lentiform nuclei, internal capsule, insula, M1-M3 cortex): 7 - Supraganglionic infarction (M4-M6 cortex): 3 (when excluding small chronic infarct described above)  Total score (0-10 with 10 being normal): 10 IMPRESSION: 1. Suspected high-density thrombus in distal right M2 branches. Small chronic cortical infarct in this distribution but no visible acute right hemispheric infarct. 2. Acute or subacute moderate size left occipital infarct, new from 12/29/2015. 3. ASPECTS is 10 on the right when accounting for chronic changes. 4. No acute hemorrhage. Electronically Signed: By: Marnee Spring M.D. On: 01/13/2016 18:08        Scheduled Meds: .  stroke: mapping our early stages of recovery book   Does not apply Once  . aspirin  300 mg Rectal Daily  . carvedilol  3.125 mg Oral BID WC  . [START ON 01/16/2016] furosemide  40 mg Oral Daily  . morphine CONCENTRATE  5 mg Oral Q6H  . neomycin-bacitracin-polymyxin  1 application Topical Daily  . RESOURCE THICKENUP  CLEAR   Oral Once  . sodium chloride flush  3 mL Intravenous Q12H   Continuous Infusions:    LOS: 1 day    Time spent: 25 min    Anel Creighton Juanetta Gosling, DO [facility] Pager [phone]  If 7PM-7AM, please contact night-coverage [email or web address] Password [email or web address] 01/15/2016, 4:36 PM

## 2016-01-16 MED ORDER — GLYCOPYRROLATE 1 MG PO TABS: 1.0000 mg | ORAL_TABLET | ORAL | Status: AC | PRN

## 2016-01-16 MED ORDER — HALOPERIDOL 0.5 MG PO TABS: 0.5000 mg | ORAL_TABLET | ORAL | Status: AC | PRN

## 2016-01-16 MED ORDER — LORAZEPAM 2 MG/ML PO CONC: 1.0000 mg | ORAL | 0 refills | Status: AC | PRN

## 2016-01-16 MED ORDER — MORPHINE SULFATE (CONCENTRATE) 10 MG/0.5ML PO SOLN: 5.0000 mg | Freq: Four times a day (QID) | ORAL | Status: AC

## 2016-01-16 NOTE — Progress Notes (Signed)
Responded to consult. Patient asleep. Prayed for patient. Chaplain available for follow-through.   01/15/16 1700  Clinical Encounter Type  Visited With Patient;Patient not available  Visit Type Initial  Referral From Nurse  Spiritual Encounters  Spiritual Needs Other (Comment)  Stress Factors  Patient Stress Factors Health changes

## 2016-01-16 NOTE — Progress Notes (Signed)
[facility] MD order. Reprt called to Okey Regalarol, RN at 12:09, [facility] arranged for 1 pm pick up today. Dr. Benjamine MolaVann spoke with patients son Laverle HobbyGregory Bouse and made him aware of transport.

## 2016-01-16 NOTE — Discharge Summary (Signed)
Physician Discharge Summary  Bethany Landry ZOX:[id number] DOB: [birth year] DOA: 01/13/2016  PCP: Clelia Schaumann, MD  Admit date: 01/13/2016 Discharge date: 01/16/2016   Recommendations for Outpatient Follow-Up:   1. To [facility]   Discharge Diagnosis:   Principal Problem:   Acute ischemic stroke [facility]) Active Problems:   Essential hypertension   Cardiac pacemaker   CKD (chronic kidney disease), stage III   Chronic hyponatremia   Chronic systolic CHF (congestive heart failure) (HCC)   Hypothyroidism   Closed right clavicular fracture   Dysarthria   CVA (cerebral vascular accident) [facility])   Palliative care encounter   Encounter for hospice care discussion   Goals of care, counseling/discussion   Discharge disposition:  [facility]  Discharge Condition: terminal  Diet recommendation: DYS diet- comfort feeds  Wound care: None.   History of Present Illness:   Bethany Landry is a 82 y.o. female with medical history significant of sys & dys CHF EF 25-30%, CKD stage III, hypothyroidism, and s/p PM for complete heart block; who presents for sudden onset of left-sided weakness. History is obtained by review of reports, the patient, and discussions with the patient's son Annaly Skop. Nursing home staff noted symptoms around 4:15 pm. Associated symptoms included left-sided facial droop with slurred speech. EMS was immediately called.   Note the patient had just been discharged from the hospital hospitalized at [facility] from 9/16 - 9/18 after having a fall suffering rib and clavicle fracture. Patient had positive orthostatic vital signs at that time, an elevated troponin thought secondary to chronic heart failure, and was possibly thought to have had an arrhythmia, but pacemaker interrogation was normal.  Patient notes that all week she has felt somewhat short of breath with intermittent wheezing. Otherwise she also complains of right shoulder pain with movement and has  been using Tylenol to treat symptoms with moderate relief. She currently denies any significant weakness per se. At baseline she states that she gets around with a rolling walker. Patient's son, who was previously an anesthesiologist here at [facility], notes that they had previously backed off of her Lasix dosage during her last hospitalization in 9/16, but she had been having worsening shortness of breath. He advised that they increase her back to the 40 mg twice daily, which she has been taking over the last 1 week. He reports verifying this change was okay with Dr. Zoila Shutter the patient's cardiologist. Discussed with him regarding her recent lab work which he note appears to be at her baseline. He also notes that after her last hospitalization she did not tolerate the right armsling and told staff to take it off. She followed up with orthopedics and they were okay with this.   Hospital Course by Problem:   Acute ischemic stroke with left hemineglect, left facial droop,and dysarthria:  -now comfort care  Right hemo- Pneumothorax - Continuous pulse oximetry with nasal cannula oxygen tokeep O2 sats greater than 92% - not seen on repeat study -discussed with son and then pulm--- patient would not want invasive procedures  Elevated troponin: Similar to previous admissionat 0.16 this appears to be the patient's baseline the result of likely CKDand CHF.   Combined congestive heart failure/ status postPMfor complete heart block: Now comfort focus   Chronic kidney disease stage III: Patient's baseline creatinine ranges from 0.9 to 1.4 - comfort focus  Essential hypertension - comfort focus  Fractured clavicle and shoulder: Occurred on 9/16. Patient did not tolerate sling. - Continue Tylenol  prn- does not tolerate pain meds with out hallucinations  Hypothyroidism -stop as comfort focus  Hyponatremia: Acute on chronic. sodium 126 on admission. Son states that baseline  sodium is somewhere around 124. secondary to diuretic - Continue to monitor   Hypocalcemia: Ionized calcium 1.03   Now comfort focus to [facility] Consultants:    Neuro  Palliative care   Discharge Exam:   Vitals:   01/16/16 0517 01/16/16 0801  BP: (!) 146/67 (!) 146/67  Pulse: 89 84  Resp: 15   Temp: 98.4 F (36.9 C)    Vitals:   01/16/16 0500 01/16/16 0517 01/16/16 0800 01/16/16 0801  BP:  (!) 146/67  (!) 146/67  Pulse:  89  84  Resp:  15    Temp:  98.4 F (36.9 C)    TempSrc: Oral Oral    SpO2:  94% 95%   Weight:  47.4 kg (104 lb 6.4 oz)    Height:        Gen:  No increased work of breathing    The results of significant diagnostics from this hospitalization (including imaging, microbiology, ancillary and laboratory) are listed below for reference.     Procedures and Diagnostic Studies:   Ct Angio Head W Or Wo Contrast  Result Date: 01/14/2016 CLINICAL DATA:  82 year old female with left side weakness. Possible hyperdense right MCA branches on noncontrast head CT yesterday. Initial encounter. EXAM: CT ANGIOGRAPHY HEAD AND NECK TECHNIQUE: Multidetector CT imaging of the head and neck was performed using the standard protocol during bolus administration of intravenous contrast. Multiplanar CT image reconstructions and MIPs were obtained to evaluate the vascular anatomy. Carotid stenosis measurements (when applicable) are obtained utilizing NASCET criteria, using the distal internal carotid diameter as the denominator. CONTRAST:  50 mL Isovue 370 COMPARISON:  Noncontrast head CT 01/13/2016 and earlier. Cervical spine CT 12/29/2015. FINDINGS: CTA NECK Skeleton: Stable cervical spine from the recent noncontrast CT. Osteopenia. Mild T2 superior endplate deformity appears stable. Displaced fractures of the right lateral second through fifth ribs. Age indeterminate fracture of the right posterior fourth rib. A posterior fifth rib fracture appears to be  either acute or subacute. There is a chronic appearing right posterior lateral sixth rib fracture. Other visualized osseous structures appear intact. Visualized paranasal sinuses and mastoids are stable and well pneumatized. Upper chest: Positive for right lung hydro pneumothorax. Small to moderate component of right lung pleural air (series 8, image 185). Small to moderate component of layering intermediate density fluid which probably is blood. Superimposed centrilobular emphysema. Left chest cardiac pacemaker. No superior mediastinal lymphadenopathy. Small volume retained secretions in the trachea. Other neck: Diminutive thyroid. Negative larynx, pharynx, parapharyngeal spaces, retropharyngeal space, sublingual space, submandibular glands and parotid glands. No cervical lymphadenopathy. Aortic arch: 3 vessel arch configuration. Calcified arch atherosclerosis, but capacious arch and great vessel origins. Right carotid system: Widely patent right cervical carotid. Mild to moderate calcified plaque at the right carotid bifurcation without stenosis. Left carotid system: Widely patent left cervical carotid. Mild to moderate calcified plaque at the left ICA origin and bulb without stenosis. Vertebral arteries: No proximal right subclavian artery stenosis with mild calcified plaque. Normal right vertebral artery origin. Tortuous right V2 segment but no right vertebral artery stenosis to the skullbase. No proximal left subclavian artery stenosis despite calcified plaque. Normal left vertebral artery origin. Tortuous left V2 segment. No left vertebral artery stenosis to the skullbase. CTA HEAD Posterior circulation: Patent PICA origins. There is mild irregularity  and stenosis of both distal vertebral arteries just distal to the PICA origins. There is mild calcified plaque in the left V4 segment which itself does not result in stenosis. Patent vertebrobasilar junction. Widely patent basilar artery without stenosis. SCA and  PCA origins are normal. Posterior communicating arteries are diminutive or absent. Mild left PCA irregularity. No significant PCA stenosis. Anterior circulation: Both ICA siphons are patent. There is calcified plaque affecting both siphons, up to moderate in slightly greater on the right, but no significant stenosis. Both carotid termini are patent. Both MCA and ACA origins are normal. The right A1 is somewhat dominant. The anterior communicating artery and bilateral ACA branches are normal. Left MCA M1 segment, bifurcation, and left MCA branches are within normal limits. Right MCA M1 segment and bifurcation are patent and appear normal. No right MCA M2 branch occlusion is identified. In the middle sylvian M2 branch there is mild irregularity and stenosis demonstrated on series 605, image 6. There is preserved distal flow into downstream M3 branches. However, there is a paucity of middle sylvian M3 branches compared to the left side best seen on series 606, image 60. Venous sinuses: Patent. Anatomic variants: Dominant right A1 segment. Delayed phase: Cytotoxic edema has developed at the right operculum best seen on series 8 images 18 through 20. No associated hemorrhage or mass effect. No ventriculomegaly. Gray-white matter differentiation elsewhere appears stable. No abnormal enhancement identified. Review of the MIP images confirms the above findings IMPRESSION: 1. Positive for Right Hemo-pneumothorax, small to moderate. Multiple displaced right rib fractures; at least ribs two through five. And the fourth and fifth ribs are fractured in two places. 2. Negative for emergent large vessel occlusion. Generalized ectatic arteries in the head and neck with no significant large vessel stenosis despite calcified atherosclerosis. 3. There is Right MCA M3 branch Occlusion suspected. And there is an Evolving Right MCA Infarct at the operculum. No associated hemorrhage or mass effect. 4. Otherwise stable CT appearance of the  brain. 5. Emphysema. 6. Preliminary report of the above was discussed by telephone with Dr. Marvel Plan on 01/14/2016 at 1157 hours. Electronically Signed   By: Odessa Fleming M.D.   On: 01/14/2016 12:21   Dg Chest 2 View  Result Date: 01/14/2016 CLINICAL DATA:  Right pneumothorax, CHF, chronic renal insufficiency, right clavicular and right rib fractures, acute CVA. EXAM: CHEST  2 VIEW COMPARISON:  Portable chest x-ray of January 13, 2016 FINDINGS: The right-sided pneumothorax is not clearly evident today. Multiple right posterior and lateral rib fractures as well as distal right clavicular fractures are noted. A trace of pleural fluid is likely present on the right and small effusion on the left as well. Retrocardiac atelectasis on the left is stable. The heart is mildly enlarged. The pulmonary vascularity is not engorged. The permanent pacemaker is in stable position. There is calcification in the wall of the thoracic aorta. IMPRESSION: No definite right-sided pneumothorax visible today. Small bilateral pleural effusions. Known right clavicular and right rib fractures. Left basilar atelectasis or pneumonia. No CHF. Aortic atherosclerosis. Electronically Signed   By: David  [place] M.D.   On: 01/14/2016 07:24   Ct Angio Neck W Or Wo Contrast  Result Date: 01/14/2016 CLINICAL DATA:  82 year old female with left side weakness. Possible hyperdense right MCA branches on noncontrast head CT yesterday. Initial encounter. EXAM: CT ANGIOGRAPHY HEAD AND NECK TECHNIQUE: Multidetector CT imaging of the head and neck was performed using the standard protocol during bolus administration of intravenous contrast. Multiplanar  CT image reconstructions and MIPs were obtained to evaluate the vascular anatomy. Carotid stenosis measurements (when applicable) are obtained utilizing NASCET criteria, using the distal internal carotid diameter as the denominator. CONTRAST:  50 mL Isovue 370 COMPARISON:  Noncontrast head CT 01/13/2016 and  earlier. Cervical spine CT 12/29/2015. FINDINGS: CTA NECK Skeleton: Stable cervical spine from the recent noncontrast CT. Osteopenia. Mild T2 superior endplate deformity appears stable. Displaced fractures of the right lateral second through fifth ribs. Age indeterminate fracture of the right posterior fourth rib. A posterior fifth rib fracture appears to be either acute or subacute. There is a chronic appearing right posterior lateral sixth rib fracture. Other visualized osseous structures appear intact. Visualized paranasal sinuses and mastoids are stable and well pneumatized. Upper chest: Positive for right lung hydro pneumothorax. Small to moderate component of right lung pleural air (series 8, image 185). Small to moderate component of layering intermediate density fluid which probably is blood. Superimposed centrilobular emphysema. Left chest cardiac pacemaker. No superior mediastinal lymphadenopathy. Small volume retained secretions in the trachea. Other neck: Diminutive thyroid. Negative larynx, pharynx, parapharyngeal spaces, retropharyngeal space, sublingual space, submandibular glands and parotid glands. No cervical lymphadenopathy. Aortic arch: 3 vessel arch configuration. Calcified arch atherosclerosis, but capacious arch and great vessel origins. Right carotid system: Widely patent right cervical carotid. Mild to moderate calcified plaque at the right carotid bifurcation without stenosis. Left carotid system: Widely patent left cervical carotid. Mild to moderate calcified plaque at the left ICA origin and bulb without stenosis. Vertebral arteries: No proximal right subclavian artery stenosis with mild calcified plaque. Normal right vertebral artery origin. Tortuous right V2 segment but no right vertebral artery stenosis to the skullbase. No proximal left subclavian artery stenosis despite calcified plaque. Normal left vertebral artery origin. Tortuous left V2 segment. No left vertebral artery stenosis  to the skullbase. CTA HEAD Posterior circulation: Patent PICA origins. There is mild irregularity and stenosis of both distal vertebral arteries just distal to the PICA origins. There is mild calcified plaque in the left V4 segment which itself does not result in stenosis. Patent vertebrobasilar junction. Widely patent basilar artery without stenosis. SCA and PCA origins are normal. Posterior communicating arteries are diminutive or absent. Mild left PCA irregularity. No significant PCA stenosis. Anterior circulation: Both ICA siphons are patent. There is calcified plaque affecting both siphons, up to moderate in slightly greater on the right, but no significant stenosis. Both carotid termini are patent. Both MCA and ACA origins are normal. The right A1 is somewhat dominant. The anterior communicating artery and bilateral ACA branches are normal. Left MCA M1 segment, bifurcation, and left MCA branches are within normal limits. Right MCA M1 segment and bifurcation are patent and appear normal. No right MCA M2 branch occlusion is identified. In the middle sylvian M2 branch there is mild irregularity and stenosis demonstrated on series 605, image 6. There is preserved distal flow into downstream M3 branches. However, there is a paucity of middle sylvian M3 branches compared to the left side best seen on series 606, image 60. Venous sinuses: Patent. Anatomic variants: Dominant right A1 segment. Delayed phase: Cytotoxic edema has developed at the right operculum best seen on series 8 images 18 through 20. No associated hemorrhage or mass effect. No ventriculomegaly. Gray-white matter differentiation elsewhere appears stable. No abnormal enhancement identified. Review of the MIP images confirms the above findings IMPRESSION: 1. Positive for Right Hemo-pneumothorax, small to moderate. Multiple displaced right rib fractures; at least ribs two through five. And the  fourth and fifth ribs are fractured in two places. 2.  Negative for emergent large vessel occlusion. Generalized ectatic arteries in the head and neck with no significant large vessel stenosis despite calcified atherosclerosis. 3. There is Right MCA M3 branch Occlusion suspected. And there is an Evolving Right MCA Infarct at the operculum. No associated hemorrhage or mass effect. 4. Otherwise stable CT appearance of the brain. 5. Emphysema. 6. Preliminary report of the above was discussed by telephone with Dr. Marvel Plan on 01/14/2016 at 1157 hours. Electronically Signed   By: Odessa Fleming M.D.   On: 01/14/2016 12:21   Dg Chest Portable 1 View  Result Date: 01/13/2016 CLINICAL DATA:  Left-sided weakness. EXAM: PORTABLE CHEST 1 VIEW COMPARISON:  Radiographs of December 30, 2015. FINDINGS: Stable cardiomediastinal silhouette. Atherosclerosis of thoracic aorta is noted. Left-sided pacemaker is unchanged in position. Status post posterior fixation of lower thoracic and lumbar spine. Combination of old and new right rib fractures are noted. Mild right apical and basilar pneumothorax is noted. Left lung is clear. IMPRESSION: Aortic atherosclerosis. Stable right rib fractures are noted. Interval development of mild right apical and basilar pneumothorax. Critical Value/emergent results were called by telephone at the time of interpretation on 01/13/2016 at 7:39 pm to Dr. Alona Bene , who verbally acknowledged these results. Electronically Signed   By: Lupita Raider, M.D.   On: 01/13/2016 19:38   Ct Head Code Stroke Wo Contrast`  Addendum Date: 01/13/2016   ADDENDUM REPORT: 01/13/2016 18:46 ADDENDUM: Small bilateral cerebellar infarcts were present 12/29/2015, new when compared to 05/04/2015. Electronically Signed   By: Marnee Spring M.D.   On: 01/13/2016 18:46   Result Date: 01/13/2016 CLINICAL DATA:  Code stroke.  Left-sided weakness. EXAM: CT HEAD WITHOUT CONTRAST TECHNIQUE: Contiguous axial images were obtained from the base of the skull through the vertex without  intravenous contrast. COMPARISON:  12/29/2015 FINDINGS: Brain: Interval moderate-sized infarct involving the left occipital lobe, nonhemorrhagic. Suspicious new density of a distal right M2 branch, 201:17 and 18. Small chronic posterior right frontal cortex infarct, best seen on sagittal reformats, stable. No acute hemorrhage, hydrocephalus, or mass. Vascular: Concern for right MCA branch thrombus, as above. Skull: No acute finding Sinuses/Orbits: Negative Other: Results paged at the time of interpretation on 01/13/2016 at 6:01 pm to Dr. Roxy Manns, who verbally acknowledged these results. ASPECTS (Alberta Stroke Program Early CT Score) - Ganglionic level infarction (caudate, lentiform nuclei, internal capsule, insula, M1-M3 cortex): 7 - Supraganglionic infarction (M4-M6 cortex): 3 (when excluding small chronic infarct described above) Total score (0-10 with 10 being normal): 10 IMPRESSION: 1. Suspected high-density thrombus in distal right M2 branches. Small chronic cortical infarct in this distribution but no visible acute right hemispheric infarct. 2. Acute or subacute moderate size left occipital infarct, new from 12/29/2015. 3. ASPECTS is 10 on the right when accounting for chronic changes. 4. No acute hemorrhage. Electronically Signed: By: Marnee Spring M.D. On: 01/13/2016 18:08     Labs:   Basic Metabolic Panel:  Recent Labs Lab 01/13/16 1745 01/13/16 1751 01/14/16 0256 01/15/16 0334  NA 126* 130* 134* 135  K 4.4 4.7 4.0 3.4*  CL 90* 91* 97* 101  CO2 26  --  25 26  GLUCOSE 120* 116* 124* 91  BUN 20 30* 17 16  CREATININE 1.39* 1.40* 1.25* 1.11*  CALCIUM 8.6*  --  9.3 8.9   GFR Estimated Creatinine Clearance: 23.7 mL/min (by C-G formula based on SCr of 1.11 mg/dL (H)). Liver Function Tests:  Recent Labs Lab 01/13/16 1745  AST 20  ALT 13*  ALKPHOS 95  BILITOT 0.7  PROT 7.0  ALBUMIN 3.2*   No results for input(s): LIPASE, AMYLASE in the last 168 hours. No results for input(s):  AMMONIA in the last 168 hours. Coagulation profile  Recent Labs Lab 01/13/16 1745  INR 1.08    CBC:  Recent Labs Lab 01/13/16 1745 01/13/16 1751 01/14/16 0256 01/15/16 0334  WBC 9.7  --  11.4* 8.2  NEUTROABS 6.8  --   --   --   HGB 11.7* 12.6 11.8* 10.9*  HCT 36.3 37.0 36.3 34.2*  MCV 91.0  --  90.5 91.2  PLT 379  --  363 324   Cardiac Enzymes:  Recent Labs Lab 01/13/16 2210 01/14/16 0256 01/14/16 1301  TROPONINI 0.17* 0.15* 0.14*   BNP: Invalid input(s): POCBNP CBG: No results for input(s): GLUCAP in the last 168 hours. D-Dimer No results for input(s): DDIMER in the last 72 hours. Hgb A1c  Recent Labs  01/14/16 0256  HGBA1C 6.2*   Lipid Profile  Recent Labs  01/14/16 0256  CHOL 155  HDL 49  LDLCALC 91  TRIG 73  CHOLHDL 3.2   Thyroid function studies No results for input(s): TSH, T4TOTAL, T3FREE, THYROIDAB in the last 72 hours.  Invalid input(s): FREET3 Anemia work up No results for input(s): VITAMINB12, FOLATE, FERRITIN, TIBC, IRON, RETICCTPCT in the last 72 hours. Microbiology Recent Results (from the past 240 hour(s))  MRSA PCR Screening     Status: None   Collection Time: 01/13/16 11:32 PM  Result Value Ref Range Status   MRSA by PCR NEGATIVE NEGATIVE Final    Comment:        The GeneXpert MRSA Assay (FDA approved for NASAL specimens only), is one component of a comprehensive MRSA colonization surveillance program. It is not intended to diagnose MRSA infection nor to guide or monitor treatment for MRSA infections.      Discharge Instructions:   Discharge Instructions    Discharge instructions    Complete by:  As directed    DYS honey thick   Increase activity slowly    Complete by:  As directed        Medication List    STOP taking these medications   aspirin 81 MG chewable tablet   carvedilol 3.125 MG tablet Commonly known as:  COREG   furosemide 40 MG tablet Commonly known as:  LASIX   ketorolac 10 MG  tablet Commonly known as:  TORADOL   levothyroxine 125 MCG tablet Commonly known as:  SYNTHROID, LEVOTHROID   loperamide 2 MG capsule Commonly known as:  IMODIUM   neomycin-bacitracin-polymyxin Oint Commonly known as:  NEOSPORIN   NON FORMULARY   oxyCODONE 5 MG immediate release tablet Commonly known as:  Oxy IR/ROXICODONE   senna-docusate 8.6-50 MG tablet Commonly known as:  Senokot-S   traMADol 50 MG tablet Commonly known as:  ULTRAM     TAKE these medications   acetaminophen 500 MG tablet Commonly known as:  TYLENOL Take 500 mg by mouth 3 (three) times daily as needed (pain).   ENSURE PLUS Liqd Take 237 mLs by mouth 2 (two) times daily as needed (for lack of appetite). Chocolate per MAR   glycopyrrolate 1 MG tablet Commonly known as:  ROBINUL Take 1 tablet (1 mg total) by mouth every 4 (four) hours as needed (excessive secretions).   haloperidol 0.5 MG tablet Commonly known as:  HALDOL Take 1 tablet (0.5 mg  total) by mouth every 4 (four) hours as needed for agitation (or delirium).   LORazepam 2 MG/ML concentrated solution Commonly known as:  ATIVAN Place 0.5 mLs (1 mg total) under the tongue every 4 (four) hours as needed for anxiety.   morphine CONCENTRATE 10 MG/0.5ML Soln concentrated solution Take 0.25 mLs (5 mg total) by mouth every 6 (six) hours.         Time coordinating discharge: 35 min  Signed:  JESSICA U VANN   [facility] 01/16/2016, 11:25 AM

## 2016-01-16 NOTE — Consult Note (Signed)
   [facility]   01/16/2016  Vernia BuffDorothy Ramesh 07-07-1920 [id number]   Patient screened for potential [facility] Care Management services. [facility] under patient's Medicare  plan. Chart review reveals from the social worker note the patient/family is pursuing  full comfort measures and [facility]  [facility] will not pursue at this time.  Please place a [facility] Care [facility] consult if patient needs changes or for questions contact:   Charlesetta ShanksVictoria Seattle Dalporto, RN BSN CCM [facility] Lia[facility]  [phone] business mobile phone Toll free office [phone]

## 2016-01-16 NOTE — Clinical Social Work Note (Addendum)
Per MD [facility] RN, patient, patient's family Laverle Hobby(Gregory Holley), and facility notified of DC. RN given number for report. DC packet on chart. Ambulance transport requested for patient for 1PM. CSW signing off.    Roddie McBryant Shaniya Tashiro MSW, [place], [place], [phone]

## 2016-01-16 NOTE — Progress Notes (Signed)
Follow-up visit. Patient's son (MD) explained her history of declining health this yr.Patient to go to hospice. Spiritual/emotional support and prayer provided. Chaplain available for follow-up.    01/16/16 1100  Clinical Encounter Type  Visited With Patient and family together  Visit Type Follow-up  Referral From Nurse  Spiritual Encounters  Spiritual Needs Prayer;Emotional;Grief support

## 2016-02-13 DEATH — deceased

## 2016-02-20 ENCOUNTER — Ambulatory Visit (INDEPENDENT_AMBULATORY_CARE_PROVIDER_SITE_OTHER): Payer: Medicare Other | Admitting: Ophthalmology

## 2016-03-24 ENCOUNTER — Telehealth: Payer: Self-pay | Admitting: Interventional Cardiology

## 2016-03-26 ENCOUNTER — Ambulatory Visit: Payer: Medicare Other | Admitting: Interventional Cardiology

## 2016-05-05 NOTE — Telephone Encounter (Signed)
error
